# Patient Record
Sex: Male | Born: 1950
Health system: Southern US, Community
[De-identification: ages and names within clinical notes are randomized; demographics above are authoritative.]

## PROBLEM LIST (undated history)

## (undated) DIAGNOSIS — K859 Acute pancreatitis without necrosis or infection, unspecified: Secondary | ICD-10-CM

## (undated) DIAGNOSIS — K635 Polyp of colon: Secondary | ICD-10-CM

## (undated) DIAGNOSIS — I519 Heart disease, unspecified: Secondary | ICD-10-CM

## (undated) DIAGNOSIS — I2510 Atherosclerotic heart disease of native coronary artery without angina pectoris: Secondary | ICD-10-CM

## (undated) DIAGNOSIS — G629 Polyneuropathy, unspecified: Secondary | ICD-10-CM

## (undated) DIAGNOSIS — S2249XA Multiple fractures of ribs, unspecified side, initial encounter for closed fracture: Secondary | ICD-10-CM

## (undated) DIAGNOSIS — E785 Hyperlipidemia, unspecified: Secondary | ICD-10-CM

## (undated) DIAGNOSIS — I10 Essential (primary) hypertension: Secondary | ICD-10-CM

## (undated) HISTORY — DX: Heart disease, unspecified: I51.9

## (undated) HISTORY — DX: Acute pancreatitis without necrosis or infection, unspecified: K85.90

## (undated) HISTORY — DX: Essential (primary) hypertension: I10

## (undated) HISTORY — DX: Hyperlipidemia, unspecified: E78.5

## (undated) HISTORY — DX: Polyneuropathy, unspecified: G62.9

## (undated) HISTORY — DX: Polyp of colon: K63.5

---

## 1999-12-23 HISTORY — PX: OTHER SURGICAL HISTORY: SHX169

## 2008-12-22 HISTORY — PX: HERNIA REPAIR: SHX51

## 2012-12-22 HISTORY — PX: ROTATOR CUFF REPAIR: SHX139

## 2012-12-22 HISTORY — PX: CORONARY ARTERY BYPASS GRAFT: SHX141

## 2015-07-03 ENCOUNTER — Ambulatory Visit (INDEPENDENT_AMBULATORY_CARE_PROVIDER_SITE_OTHER): Payer: Self-pay | Admitting: Cardiovascular Disease

## 2015-07-03 ENCOUNTER — Encounter: Payer: Self-pay | Admitting: Cardiovascular Disease

## 2015-07-03 VITALS — BP 126/68 | HR 60 | Ht 68.0 in | Wt 175.8 lb

## 2015-07-03 DIAGNOSIS — Z789 Other specified health status: Secondary | ICD-10-CM

## 2015-07-03 DIAGNOSIS — Z951 Presence of aortocoronary bypass graft: Secondary | ICD-10-CM

## 2015-07-03 DIAGNOSIS — Z889 Allergy status to unspecified drugs, medicaments and biological substances status: Secondary | ICD-10-CM

## 2015-07-03 DIAGNOSIS — I25810 Atherosclerosis of coronary artery bypass graft(s) without angina pectoris: Secondary | ICD-10-CM

## 2015-07-03 DIAGNOSIS — E785 Hyperlipidemia, unspecified: Secondary | ICD-10-CM | POA: Diagnosis not present

## 2015-07-03 DIAGNOSIS — I10 Essential (primary) hypertension: Secondary | ICD-10-CM | POA: Diagnosis not present

## 2015-07-03 LAB — LIPID PANEL
CHOL/HDL RATIO: 5.1 ratio
CHOLESTEROL: 174 mg/dL (ref 0–200)
HDL: 34 mg/dL — AB (ref >=40)
LDL Cholesterol: 120 mg/dL — ABNORMAL HIGH (ref 0–99)
TRIGLYCERIDES: 102 mg/dL (ref <150)
VLDL: 20 mg/dL (ref 0–40)

## 2015-07-03 NOTE — Progress Notes (Signed)
Patient ID: [name], male   DOB: 05-24-1951, 64 y.o.   MRN: [id number]       CARDIOLOGY CONSULT NOTE  Patient ID: [name] MRN: [id number] DOB/AGE: 1951/07/28 64 y.o.  Admit date: (Not on file) Primary Physician Asencion Noble, MD  Reason for Consultation: CABG  HPI: The patient is a 64 year old male who I am meeting for the first time today. He was previously seen by cardiologist at [facility] in [place], [place]. He reportedly has a history of coronary artery disease and two-vessel CABG by Dr. Redmond Pulling with a LIMA to the LAD and a vein graft to the diagonal. He also has hypertension and dyslipidemia. CABG was performed in April 2014. A stress echocardiogram at that time demonstrated normal left ventricular systolic function, LVEF 86%, with apical and septal hypokinesis at rest. ECG performed on 05/20/13 demonstrated normal sinus rhythm with late R-wave progression and a nonspecific T wave abnormality with T-wave inversions in leads V1, V2, and aVL.  He is doing well and denies chest pain, palpitations, leg swelling, orthopnea, dizziness, and shortness of breath. He stays active and golfs and is remodeling his home. He was not able to tolerate low-dose lovastatin as it led to severe lower extremity aches and cramps. He has never smoked. He said his total cholesterol used to run 180-200.   ECG performed in the office today demonstrates sinus bradycardia, heart rate 56 bpm, with a mild nonspecific T wave abnormality in aVL and V2. There is possible old anteroseptal infarct.  His primary care physician in [place] was Dr. Anastasia Pall.   Soc: He lived in [place] for 49 years and then moved to [place] for 16 years but calls [place] home. He used to work in [employer] but has since retired.   Not on File  Current Outpatient Prescriptions  Medication Sig Dispense Refill  . aspirin 81 MG tablet Take 81 mg by mouth daily.    . irbesartan (AVAPRO) 75 MG  tablet Take 75 mg by mouth daily.    . metoprolol tartrate (LOPRESSOR) 25 MG tablet Take 12.5 mg by mouth every morning.    . sildenafil (VIAGRA) 100 MG tablet Take 100 mg by mouth daily as needed for erectile dysfunction.    . Vitamin D, Ergocalciferol, (DRISDOL) 50000 UNITS CAPS capsule Take 50,000 Units by mouth every 7 (seven) days.     No current facility-administered medications for this visit.    No past medical history on file.  No past surgical history on file.  History   Social History  . Marital Status: Married    Spouse Name: N/A  . Number of Children: N/A  . Years of Education: N/A   Occupational History  . Not on file.   Social History Main Topics  . Smoking status: Never Smoker   . Smokeless tobacco: Not on file  . Alcohol Use: Not on file  . Drug Use: Not on file  . Sexual Activity: Not on file   Other Topics Concern  . Not on file   Social History Narrative  . No narrative on file     No family history of premature CAD in 1st degree relatives.  Prior to Admission medications   Not on File     Review of systems complete and found to be negative unless listed above in HPI     Physical exam Blood pressure 126/68, pulse 60, height 5\' 8"  (1.727 m), weight 175 lb 12.8 oz (79.742 kg), SpO2 97 %.  General: NAD Neck: No JVD, no thyromegaly or thyroid nodule.  Lungs: Clear to auscultation bilaterally with normal respiratory effort. CV: Nondisplaced PMI. Regular rate and rhythm, normal S1/S2, no S3/S4, no murmur.  No peripheral edema.  No carotid bruit.  Normal pedal pulses.  Abdomen: Soft, nontender, no hepatosplenomegaly, no distention.  Skin: Intact without lesions or rashes.  Neurologic: Alert and oriented x 3.  Psych: Normal affect. Extremities: No clubbing or cyanosis.  HEENT: Normal.   ECG: Most recent ECG reviewed.  Labs:  No results found for: WBC, HGB, HCT, MCV, PLT No results for input(s): NA, K, CL, CO2, BUN, CREATININE, CALCIUM,  PROT, BILITOT, ALKPHOS, ALT, AST, GLUCOSE in the last 168 hours.  Invalid input(s): LABALBU No results found for: CKTOTAL, CKMB, CKMBINDEX, TROPONINI No results found for: CHOL No results found for: HDL No results found for: LDLCALC No results found for: TRIG No results found for: CHOLHDL No results found for: LDLDIRECT       Studies: No results found.  ASSESSMENT AND PLAN:  1. CAD with 2-vessel CABG in 03/2013: Symptomatically stable. Continue ASA and metoprolol. Intolerant of statin therapy.   2. Essential HTN: Well controlled. Continue irbesartan 75 mg daily.  3. Dyslipidemia: Intolerant of low-dose lovastatin. Will obtain lipid panel.  Dispo: f/u 1 year.   Signed: Kate Sable, M.D., F.A.C.C.  07/03/2015, 9:42 AM

## 2015-07-03 NOTE — Patient Instructions (Addendum)
Your physician wants you to follow-up in: 1 year with Dr.Koneswaran You will receive a reminder letter in the mail two months in advance. If you don't receive a letter, please call our office to schedule the follow-up appointment.   Your physician recommends that you continue on your current medications as directed. Please refer to the Current Medication list given to you today.   Please get FASTING lipid and BMET      Thank you for choosing [facility] !

## 2015-07-04 ENCOUNTER — Telehealth: Payer: Self-pay

## 2015-07-04 MED ORDER — ROSUVASTATIN CALCIUM 10 MG PO TABS: 10.0000 mg | ORAL_TABLET | Freq: Every day | ORAL | Status: DC

## 2015-07-04 NOTE — Telephone Encounter (Signed)
-----   Message from Herminio Commons, MD sent at 07/04/2015 11:00 AM EDT ----- Needs statin therapy. Try low-dose Crestor 10 mg.

## 2015-07-04 NOTE — Telephone Encounter (Signed)
Pt notified rx called in.

## 2015-07-05 LAB — BASIC METABOLIC PANEL
BUN: 21 mg/dL (ref 6–23)
CALCIUM: 9.6 mg/dL (ref 8.4–10.5)
CHLORIDE: 103 meq/L (ref 96–112)
CO2: 26 mEq/L (ref 19–32)
Creat: 0.98 mg/dL (ref 0.50–1.35)
GLUCOSE: 88 mg/dL (ref 70–99)
POTASSIUM: 4.3 meq/L (ref 3.5–5.3)
SODIUM: 139 meq/L (ref 135–145)

## 2016-07-22 ENCOUNTER — Ambulatory Visit (INDEPENDENT_AMBULATORY_CARE_PROVIDER_SITE_OTHER): Payer: Medicare Other | Admitting: Cardiovascular Disease

## 2016-07-22 ENCOUNTER — Encounter (INDEPENDENT_AMBULATORY_CARE_PROVIDER_SITE_OTHER): Payer: Self-pay

## 2016-07-22 ENCOUNTER — Encounter: Payer: Self-pay | Admitting: Cardiovascular Disease

## 2016-07-22 VITALS — BP 107/70 | HR 71 | Ht 68.0 in | Wt 171.0 lb

## 2016-07-22 DIAGNOSIS — E785 Hyperlipidemia, unspecified: Secondary | ICD-10-CM | POA: Diagnosis not present

## 2016-07-22 DIAGNOSIS — I25810 Atherosclerosis of coronary artery bypass graft(s) without angina pectoris: Secondary | ICD-10-CM | POA: Diagnosis not present

## 2016-07-22 DIAGNOSIS — I10 Essential (primary) hypertension: Secondary | ICD-10-CM | POA: Diagnosis not present

## 2016-07-22 DIAGNOSIS — Z889 Allergy status to unspecified drugs, medicaments and biological substances status: Secondary | ICD-10-CM | POA: Diagnosis not present

## 2016-07-22 DIAGNOSIS — Z789 Other specified health status: Secondary | ICD-10-CM

## 2016-07-22 NOTE — Patient Instructions (Signed)
Your physician wants you to follow-up in: 1 year You will receive a reminder letter in the mail two months in advance. If you don't receive a letter, please call our office to schedule the follow-up appointment.    Your physician recommends that you continue on your current medications as directed. Please refer to the Current Medication list given to you today.     Thank you for choosing [facility] !  

## 2016-07-22 NOTE — Progress Notes (Signed)
      SUBJECTIVE: The patient presents for follow up of CAD and h/o CABG. The patient denies any symptoms of chest pain, palpitations, shortness of breath, lightheadedness, dizziness, leg swelling, orthopnea, PND, and syncope. ECG performed in the office today which I personally interpreted demonstrated sinus rhythm with old anteroseptal infarct and no ischemic ST segment or T-wave abnormalities.  He walks 2 miles 4 days a week and golfs 3 days a week. 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 He developed myalgias with Crestor and is now taking Zetia.  Review of Systems: As per "subjective", otherwise negative.  No Known Allergies  Current Outpatient Prescriptions  Medication Sig Dispense Refill  . aspirin 81 MG tablet Take 81 mg by mouth daily.    Marland Kitchen ezetimibe (ZETIA) 10 MG tablet Take 10 mg by mouth daily.   11  . irbesartan (AVAPRO) 75 MG tablet Take 75 mg by mouth daily.    . metoprolol tartrate (LOPRESSOR) 25 MG tablet Take 12.5 mg by mouth every morning.    . sildenafil (VIAGRA) 100 MG tablet Take 100 mg by mouth daily as needed for erectile dysfunction.     No current facility-administered medications for this visit.     No past medical history on file.  Past Surgical History:  Procedure Laterality Date  . HERNIA REPAIR    . ROTATOR CUFF REPAIR    . vericous veins  2001    Social History   Social History  . Marital status: Married    Spouse name: N/A  . Number of children: N/A  . Years of education: N/A   Occupational History  . Not on file.   Social History Main Topics  . Smoking status: Never Smoker  . Smokeless tobacco: Never Used  . Alcohol use No  . Drug use: No  . Sexual activity: Not on file   Other Topics Concern  . Not on file   Social History Narrative  . No narrative on file     Vitals:   07/22/16 0901  BP: 107/70  Pulse: 71  SpO2: 97%  Weight: 171 lb (77.6 kg)  Height: 5\' 8"  (1.727 m)    PHYSICAL EXAM General: NAD HEENT:  Normal. Neck: No JVD, no thyromegaly. Lungs: Clear to auscultation bilaterally with normal respiratory effort. CV: Nondisplaced PMI.  Regular rate and rhythm, normal S1/S2, no S3/S4, no murmur. No pretibial or periankle edema.  No carotid bruit.   Abdomen: Soft, nontender, no distention.  Neurologic: Alert and oriented.  Psych: Normal affect. Skin: Normal. Musculoskeletal: No gross deformities.    ECG: Most recent ECG reviewed.      ASSESSMENT AND PLAN: 1. CAD with 2-vessel CABG in 03/2013: Symptomatically stable. Continue ASA and metoprolol. Intolerant of statin therapy. On Zetia.  2. Essential HTN: Well controlled. Continue irbesartan 75 mg daily.  3. Dyslipidemia: Intolerant of low-dose lovastatin and Crestor. Taking Zetia. TC reportedly 163.  Dispo: f/u 1 year.   Kate Sable, M.D., F.A.C.C.

## 2016-07-30 ENCOUNTER — Encounter (INDEPENDENT_AMBULATORY_CARE_PROVIDER_SITE_OTHER): Payer: Self-pay | Admitting: *Deleted

## 2016-12-10 ENCOUNTER — Encounter (INDEPENDENT_AMBULATORY_CARE_PROVIDER_SITE_OTHER): Payer: Self-pay

## 2016-12-11 ENCOUNTER — Other Ambulatory Visit (INDEPENDENT_AMBULATORY_CARE_PROVIDER_SITE_OTHER): Payer: Self-pay | Admitting: *Deleted

## 2016-12-11 DIAGNOSIS — Z86010 Personal history of colonic polyps: Secondary | ICD-10-CM | POA: Insufficient documentation

## 2017-01-23 ENCOUNTER — Encounter (INDEPENDENT_AMBULATORY_CARE_PROVIDER_SITE_OTHER): Payer: Self-pay | Admitting: *Deleted

## 2017-01-23 ENCOUNTER — Telehealth (INDEPENDENT_AMBULATORY_CARE_PROVIDER_SITE_OTHER): Payer: Self-pay | Admitting: *Deleted

## 2017-01-23 NOTE — Telephone Encounter (Signed)
Patient needs suprep 

## 2017-01-26 MED ORDER — SUPREP BOWEL PREP KIT 17.5-3.13-1.6 GM/177ML PO SOLN: 1.0000 | Freq: Once | ORAL | 0 refills | Status: AC

## 2017-01-27 DIAGNOSIS — H5203 Hypermetropia, bilateral: Secondary | ICD-10-CM | POA: Diagnosis not present

## 2017-01-27 DIAGNOSIS — H524 Presbyopia: Secondary | ICD-10-CM | POA: Diagnosis not present

## 2017-01-27 DIAGNOSIS — H52223 Regular astigmatism, bilateral: Secondary | ICD-10-CM | POA: Diagnosis not present

## 2017-02-13 ENCOUNTER — Telehealth (INDEPENDENT_AMBULATORY_CARE_PROVIDER_SITE_OTHER): Payer: Self-pay | Admitting: *Deleted

## 2017-02-13 NOTE — Telephone Encounter (Signed)
Referring MD/PCP: fagan   Procedure: tcs  Reason/Indication:  Hx polyps  Has patient had this procedure before?  Yes, 2010 -- scanned  If so, when, by whom and where?    Is there a family history of colon cancer?  no  Who?  What age when diagnosed?    Is patient diabetic?   no      Does patient have prosthetic heart valve or mechanical valve?  no  Do you have a pacemaker?  no  Has patient ever had endocarditis? no  Has patient had joint replacement within last 12 months?  no  Does patient tend to be constipated or take laxatives? no  Does patient have a history of alcohol/drug use?  no  Is patient on Coumadin, Plavix and/or Aspirin? yes  Medications: asa 81 mg daily, irbesartan 75 mg daily, metoprolol 25 mg 1/2 tab daily  Allergies: nkda  Medication Adjustment: asa 2 days  Procedure date & time: 03/11/17 at 930

## 2017-02-16 NOTE — Telephone Encounter (Signed)
agree

## 2017-03-11 ENCOUNTER — Encounter (HOSPITAL_COMMUNITY): Payer: Self-pay | Admitting: *Deleted

## 2017-03-11 ENCOUNTER — Encounter (HOSPITAL_COMMUNITY)
Admission: RE | Disposition: A | Discharge status: Home / Self Care | Payer: Self-pay | Source: Ambulatory Visit | Attending: Internal Medicine

## 2017-03-11 ENCOUNTER — Ambulatory Visit (HOSPITAL_COMMUNITY)
Admission: RE | Admit: 2017-03-11 | Discharge: 2017-03-11 | Disposition: A | Discharge status: Home / Self Care | Payer: PPO | Source: Ambulatory Visit | Attending: Internal Medicine | Admitting: Internal Medicine

## 2017-03-11 DIAGNOSIS — I10 Essential (primary) hypertension: Secondary | ICD-10-CM | POA: Diagnosis not present

## 2017-03-11 DIAGNOSIS — Z79899 Other long term (current) drug therapy: Secondary | ICD-10-CM | POA: Insufficient documentation

## 2017-03-11 DIAGNOSIS — Z86010 Personal history of colonic polyps: Secondary | ICD-10-CM | POA: Insufficient documentation

## 2017-03-11 DIAGNOSIS — Z951 Presence of aortocoronary bypass graft: Secondary | ICD-10-CM | POA: Insufficient documentation

## 2017-03-11 DIAGNOSIS — D122 Benign neoplasm of ascending colon: Secondary | ICD-10-CM | POA: Diagnosis not present

## 2017-03-11 DIAGNOSIS — I2510 Atherosclerotic heart disease of native coronary artery without angina pectoris: Secondary | ICD-10-CM | POA: Diagnosis not present

## 2017-03-11 DIAGNOSIS — E785 Hyperlipidemia, unspecified: Secondary | ICD-10-CM | POA: Insufficient documentation

## 2017-03-11 DIAGNOSIS — Z09 Encounter for follow-up examination after completed treatment for conditions other than malignant neoplasm: Secondary | ICD-10-CM | POA: Diagnosis not present

## 2017-03-11 DIAGNOSIS — D123 Benign neoplasm of transverse colon: Secondary | ICD-10-CM | POA: Diagnosis not present

## 2017-03-11 DIAGNOSIS — Z1211 Encounter for screening for malignant neoplasm of colon: Secondary | ICD-10-CM | POA: Insufficient documentation

## 2017-03-11 DIAGNOSIS — K635 Polyp of colon: Secondary | ICD-10-CM | POA: Insufficient documentation

## 2017-03-11 DIAGNOSIS — Z7982 Long term (current) use of aspirin: Secondary | ICD-10-CM | POA: Diagnosis not present

## 2017-03-11 HISTORY — PX: POLYPECTOMY: SHX5525

## 2017-03-11 HISTORY — DX: Atherosclerotic heart disease of native coronary artery without angina pectoris: I25.10

## 2017-03-11 HISTORY — PX: COLONOSCOPY: SHX5424

## 2017-03-11 SURGERY — COLONOSCOPY
Anesthesia: Moderate Sedation

## 2017-03-11 MED ORDER — MEPERIDINE HCL 50 MG/ML IJ SOLN
INTRAMUSCULAR | Status: DC | PRN
  Administered 2017-03-11 (×2): 25 mg via INTRAVENOUS

## 2017-03-11 MED ORDER — MIDAZOLAM HCL 5 MG/5ML IJ SOLN
INTRAMUSCULAR | Status: DC | PRN
Start: 2017-03-11 — End: 2017-03-11
  Administered 2017-03-11: 1 mg via INTRAVENOUS
  Administered 2017-03-11 (×2): 2 mg via INTRAVENOUS
  Administered 2017-03-11: 1 mg via INTRAVENOUS

## 2017-03-11 MED ORDER — MIDAZOLAM HCL 5 MG/5ML IJ SOLN
INTRAMUSCULAR | Status: AC
  Filled 2017-03-11: qty 10

## 2017-03-11 MED ORDER — STERILE WATER FOR IRRIGATION IR SOLN
Status: DC | PRN
  Administered 2017-03-11: 5 mL

## 2017-03-11 MED ORDER — SODIUM CHLORIDE 0.9 % IV SOLN
INTRAVENOUS | Status: DC
  Administered 2017-03-11: 1000 mL via INTRAVENOUS

## 2017-03-11 MED ORDER — MEPERIDINE HCL 50 MG/ML IJ SOLN
INTRAMUSCULAR | Status: AC
  Filled 2017-03-11: qty 1

## 2017-03-11 NOTE — Discharge Instructions (Signed)
Resume aspirin on 03/12/2017. Resume other medications as before. Resume usual diet. No driving for 24 hours. Physician will call with biopsy results.    Colonoscopy, Adult, Care After This sheet gives you information about how to care for yourself after your procedure. Your doctor may also give you more specific instructions. If you have problems or questions, call your doctor. Follow these instructions at home: General instructions    For the first 24 hours after the procedure:  Do not drive or use machinery.  Do not sign important documents.  Do not drink alcohol.  Do your daily activities more slowly than normal.  Eat foods that are soft and easy to digest.  Rest often.  Take over-the-counter or prescription medicines only as told by your doctor.  It is up to you to get the results of your procedure. Ask your doctor, or the department performing the procedure, when your results will be ready. To help cramping and bloating:   Try walking around.  Put heat on your belly (abdomen) as told by your doctor. Use a heat source that your doctor recommends, such as a moist heat pack or a heating pad.  Put a towel between your skin and the heat source.  Leave the heat on for 20-30 minutes.  Remove the heat if your skin turns bright red. This is especially important if you cannot feel pain, heat, or cold. You can get burned. Eating and drinking   Drink enough fluid to keep your pee (urine) clear or pale yellow.  Return to your normal diet as told by your doctor. Avoid heavy or fried foods that are hard to digest.  Avoid drinking alcohol for as long as told by your doctor. Contact a doctor if:  You have blood in your poop (stool) 2-3 days after the procedure. Get help right away if:  You have more than a small amount of blood in your poop.  You see large clumps of tissue (blood clots) in your poop.  Your belly is swollen.  You feel sick to your stomach  (nauseous).  You throw up (vomit).  You have a fever.  You have belly pain that gets worse, and medicine does not help your pain.     Colon Polyps Polyps are tissue growths inside the body. Polyps can grow in many places, including the large intestine (colon). A polyp may be a round bump or a mushroom-shaped growth. You could have one polyp or several. Most colon polyps are noncancerous (benign). However, some colon polyps can become cancerous over time. What are the causes? The exact cause of colon polyps is not known. What increases the risk? This condition is more likely to develop in people who:  Have a family history of colon cancer or colon polyps.  Are older than 9 or older than 45 if they are African American.  Have inflammatory bowel disease, such as ulcerative colitis or Crohn disease.  Are overweight.  Smoke cigarettes.  Do not get enough exercise.  Drink too much alcohol.  Eat a diet that is:  High in fat and red meat.  Low in fiber.  Had childhood cancer that was treated with abdominal radiation. What are the signs or symptoms? Most polyps do not cause symptoms. If you have symptoms, they may include:  Blood coming from your rectum when having a bowel movement.  Blood in your stool.The stool may look dark red or black.  A change in bowel habits, such as constipation or diarrhea. How  is this diagnosed? This condition is diagnosed with a colonoscopy. This is a procedure that uses a lighted, flexible scope to look at the inside of your colon. How is this treated? Treatment for this condition involves removing any polyps that are found. Those polyps will then be tested for cancer. If cancer is found, your health care provider will talk to you about options for colon cancer treatment. Follow these instructions at home: Diet   Eat plenty of fiber, such as fruits, vegetables, and whole grains.  Eat foods that are high in calcium and vitamin D, such as  milk, cheese, yogurt, eggs, liver, fish, and broccoli.  Limit foods high in fat, red meats, and processed meats, such as hot dogs, sausage, bacon, and lunch meats.  Maintain a healthy weight, or lose weight if recommended by your health care provider. General instructions   Do not smoke cigarettes.  Do not drink alcohol excessively.  Keep all follow-up visits as told by your health care provider. This is important. This includes keeping regularly scheduled colonoscopies. Talk to your health care provider about when you need a colonoscopy.  Exercise every day or as told by your health care provider. Contact a health care provider if:  You have new or worsening bleeding during a bowel movement.  You have new or increased blood in your stool.  You have a change in bowel habits.  You unexpectedly lose weight. This information is not intended to replace advice given to you by your health care provider. Make sure you discuss any questions you have with your health care provider. Document Released: 09/03/2004 Document Revised: 05/15/2016 Document Reviewed: 10/29/2015 Elsevier Interactive Patient Education  2017 [employer].

## 2017-03-11 NOTE — Op Note (Signed)
[facility] Patient Name: [name] Procedure Date: 03/11/2017 9:12 AM MRN: [id number] Date of Birth: [birth year] Attending MD: Hildred Laser , MD CSN: [id number] Age: 66 Admit Type: Outpatient Procedure:                Colonoscopy Indications:              High risk colon cancer surveillance: Personal                            history of colonic polyps Providers:                Hildred Laser, MD, Otis Peak B. Sharon Seller, RN, Aram Candela Referring MD:             Asencion Noble, MD Medicines:                Meperidine 50 mg IV, Midazolam 6 mg IV Complications:            No immediate complications. Estimated Blood Loss:     Estimated blood loss was minimal. Procedure:                Pre-Anesthesia Assessment:                           - Prior to the procedure, a History and Physical                            was performed, and patient medications and                            allergies were reviewed. The patient's tolerance of                            previous anesthesia was also reviewed. The risks                            and benefits of the procedure and the sedation                            options and risks were discussed with the patient.                            All questions were answered, and informed consent                            was obtained. Prior Anticoagulants: The patient                            last took aspirin 4 days prior to the procedure.                            ASA Grade Assessment: II - A patient with mild  systemic disease. After reviewing the risks and                            benefits, the patient was deemed in satisfactory                            condition to undergo the procedure.                           After obtaining informed consent, the colonoscope                            was passed under direct vision. Throughout the                            procedure, the patient's  blood pressure, pulse, and                            oxygen saturations were monitored continuously. The                            [id number] ([id number]) was introduced through the                            anus and advanced to the the cecum, identified by                            appendiceal orifice and ileocecal valve. The                            colonoscopy was performed without difficulty. The                            patient tolerated the procedure well. The quality                            of the bowel preparation was adequate. The                            ileocecal valve, appendiceal orifice, and rectum                            were photographed. Scope In: 9:30:42 AM Scope Out: 9:53:26 AM Scope Withdrawal Time: 0 hours 18 minutes 5 seconds  Total Procedure Duration: 0 hours 22 minutes 44 seconds  Findings:      The perianal and digital rectal examinations were normal.      A 6 mm polyp was found in the mid ascending colon. The polyp was       sessile. The polyp was removed with a cold snare. Resection and       retrieval were complete. The pathology specimen was placed into Bottle       Number 1.      Two sessile polyps were found in the hepatic flexure. The polyps were       small in size.  These polyps were removed with a cold snare. Resection       and retrieval were complete. The pathology specimen was placed into       Bottle Number 1.      The retroflexed view of the distal rectum and anal verge was normal and       showed no anal or rectal abnormalities. Impression:               - One 6 mm polyp in the mid ascending colon,                            removed with a cold snare. Resected and retrieved.                           - Two small polyps at the hepatic flexure, removed                            with a cold snare. Resected and retrieved. Moderate Sedation:      Moderate (conscious) sedation was administered by the endoscopy nurse       and supervised  by the endoscopist. The following parameters were       monitored: oxygen saturation, heart rate, blood pressure, CO2       capnography and response to care. Total physician intraservice time was       29 minutes. Recommendation:           - Patient has a contact number available for                            emergencies. The signs and symptoms of potential                            delayed complications were discussed with the                            patient. Return to normal activities tomorrow.                            Written discharge instructions were provided to the                            patient.                           - Resume previous diet today.                           - Continue present medications.                           - Resume aspirin at prior dose tomorrow.                           - Await pathology results.                           - Repeat colonoscopy date to be  determined after                            pending pathology results are reviewed for                            surveillance based on pathology results. Procedure Code(s):        --- Professional ---                           [phone], Colonoscopy, flexible; with removal of                            tumor(s), polyp(s), or other lesion(s) by snare                            technique                           99152, Moderate sedation services provided by the                            same physician or other qualified health care                            professional performing the diagnostic or                            therapeutic service that the sedation supports,                            requiring the presence of an independent trained                            observer to assist in the monitoring of the                            patient's level of consciousness and physiological                            status; initial 15 minutes of intraservice time,                             patient age 5 years or older                           [phone], Moderate sedation services; each additional                            15 minutes intraservice time Diagnosis Code(s):        --- Professional ---                           Z86.010, Personal history of colonic polyps  D12.2, Benign neoplasm of ascending colon                           D12.3, Benign neoplasm of transverse colon (hepatic                            flexure or splenic flexure) CPT copyright 2016 American Medical Association. All rights reserved. The codes documented in this report are preliminary and upon coder review may  be revised to meet current compliance requirements. Hildred Laser, MD Hildred Laser, MD 03/11/2017 10:01:07 AM This report has been signed electronically. Number of Addenda: 0

## 2017-03-11 NOTE — H&P (Signed)
[name] is an 66 y.o. male.   Chief Complaint: Patient is here for colonoscopy. HPI: Patient is 67 year old Caucasian male with history of colonic polyps. This is patient's fifth colonoscopy. He has had polyps and 3 first exam but not on the last exam in 2010. Patient denies abdominal pain change in bowel habits or rectal bleeding. He has been off aspirin for 4 days. Family history is negative for CRC.  Past Medical History:  Diagnosis Date  . Coronary artery disease         Hyperlipidemia       Hypertension  Past Surgical History:  Procedure Laterality Date  . CORONARY ARTERY BYPASS GRAFT  2014  . HERNIA REPAIR    . ROTATOR CUFF REPAIR    . vericous veins  2001    Family History  Problem Relation Age of Onset  . Heart disease Father   . Dementia Sister   . Stroke Mother    Social History:  reports that he has never smoked. He has never used smokeless tobacco. He reports that he drinks alcohol. He reports that he does not use drugs.  Allergies: No Known Allergies  Medications Prior to Admission  Medication Sig Dispense Refill  . acetaminophen (TYLENOL) 325 MG tablet Take 650 mg by mouth daily as needed for moderate pain or headache.    Marland Kitchen aspirin 81 MG tablet Take 81 mg by mouth daily.    . Cholecalciferol (VITAMIN D PO) Take 1 capsule by mouth 3 (three) times a week.    . irbesartan (AVAPRO) 75 MG tablet Take 75 mg by mouth daily.    . metoprolol tartrate (LOPRESSOR) 25 MG tablet Take 12.5 mg by mouth every morning.    . Multiple Vitamins-Minerals (ZINC PO) Take 1 capsule by mouth 3 (three) times a week.    . sildenafil (REVATIO) 20 MG tablet Take 40-100 mg by mouth daily as needed (erectile dysfunction).      No results found for this or any previous visit (from the past 48 hour(s)). No results found.  ROS  Blood pressure 116/74, pulse 61, temperature 97.7 F (36.5 C), temperature source Oral, resp. rate 15, height 5\' 8"  (1.727 m), weight 175 lb (79.4 kg), SpO2  98 %. Physical Exam  Constitutional: He appears well-developed and well-nourished.  HENT:  Mouth/Throat: Oropharynx is clear and moist.  Eyes: Conjunctivae are normal.  Neck: No thyromegaly present.  Cardiovascular: Normal rate, regular rhythm and normal heart sounds.   No murmur heard. Respiratory: Effort normal and breath sounds normal.  GI: Soft. He exhibits no distension and no mass. There is no tenderness.  Musculoskeletal: He exhibits no edema.  Lymphadenopathy:    He has no cervical adenopathy.  Neurological: He is alert.  Skin: Skin is warm and dry.     Assessment/Plan History of colonic adenomas. Surveillance colonoscopy  Hildred Laser, MD 03/11/2017, 9:20 AM

## 2017-03-16 ENCOUNTER — Encounter (HOSPITAL_COMMUNITY): Payer: Self-pay | Admitting: Internal Medicine

## 2017-04-09 DIAGNOSIS — R202 Paresthesia of skin: Secondary | ICD-10-CM | POA: Diagnosis not present

## 2017-04-14 DIAGNOSIS — R7301 Impaired fasting glucose: Secondary | ICD-10-CM | POA: Diagnosis not present

## 2017-06-22 DIAGNOSIS — D485 Neoplasm of uncertain behavior of skin: Secondary | ICD-10-CM | POA: Diagnosis not present

## 2017-06-22 DIAGNOSIS — L821 Other seborrheic keratosis: Secondary | ICD-10-CM | POA: Diagnosis not present

## 2017-06-22 DIAGNOSIS — D225 Melanocytic nevi of trunk: Secondary | ICD-10-CM | POA: Diagnosis not present

## 2017-07-23 DIAGNOSIS — I2510 Atherosclerotic heart disease of native coronary artery without angina pectoris: Secondary | ICD-10-CM | POA: Diagnosis not present

## 2017-07-23 DIAGNOSIS — E785 Hyperlipidemia, unspecified: Secondary | ICD-10-CM | POA: Diagnosis not present

## 2017-07-23 DIAGNOSIS — Z125 Encounter for screening for malignant neoplasm of prostate: Secondary | ICD-10-CM | POA: Diagnosis not present

## 2017-07-23 DIAGNOSIS — I10 Essential (primary) hypertension: Secondary | ICD-10-CM | POA: Diagnosis not present

## 2017-07-23 DIAGNOSIS — Z79899 Other long term (current) drug therapy: Secondary | ICD-10-CM | POA: Diagnosis not present

## 2017-07-30 DIAGNOSIS — R202 Paresthesia of skin: Secondary | ICD-10-CM | POA: Diagnosis not present

## 2017-07-30 DIAGNOSIS — H0019 Chalazion unspecified eye, unspecified eyelid: Secondary | ICD-10-CM | POA: Diagnosis not present

## 2017-07-30 DIAGNOSIS — Z23 Encounter for immunization: Secondary | ICD-10-CM | POA: Diagnosis not present

## 2017-07-30 DIAGNOSIS — I10 Essential (primary) hypertension: Secondary | ICD-10-CM | POA: Diagnosis not present

## 2017-07-30 DIAGNOSIS — I2510 Atherosclerotic heart disease of native coronary artery without angina pectoris: Secondary | ICD-10-CM | POA: Diagnosis not present

## 2017-08-26 ENCOUNTER — Ambulatory Visit (INDEPENDENT_AMBULATORY_CARE_PROVIDER_SITE_OTHER): Payer: PPO | Admitting: Cardiovascular Disease

## 2017-08-26 ENCOUNTER — Encounter: Payer: Self-pay | Admitting: Cardiovascular Disease

## 2017-08-26 VITALS — BP 106/68 | HR 55 | Ht 68.0 in | Wt 181.0 lb

## 2017-08-26 DIAGNOSIS — I10 Essential (primary) hypertension: Secondary | ICD-10-CM | POA: Diagnosis not present

## 2017-08-26 DIAGNOSIS — E785 Hyperlipidemia, unspecified: Secondary | ICD-10-CM

## 2017-08-26 DIAGNOSIS — I25708 Atherosclerosis of coronary artery bypass graft(s), unspecified, with other forms of angina pectoris: Secondary | ICD-10-CM

## 2017-08-26 DIAGNOSIS — Z789 Other specified health status: Secondary | ICD-10-CM

## 2017-08-26 NOTE — Progress Notes (Signed)
SUBJECTIVE: The patient presents for follow up of CAD and h/o CABG.  The patient denies any symptoms of chest pain, palpitations, shortness of breath, lightheadedness, dizziness, leg swelling, orthopnea, PND, and syncope.  ECG performed in the office today which I ordered and personally interpreted demonstrated sinus bradycardia, 59 bpm, with possible old anteroseptal infarct.   Review of Systems: As per "subjective", otherwise negative.  No Known Allergies  Current Outpatient Prescriptions  Medication Sig Dispense Refill  . acetaminophen (TYLENOL) 325 MG tablet Take 650 mg by mouth daily as needed for moderate pain or headache.    Marland Kitchen aspirin 81 MG tablet Take 1 tablet (81 mg total) by mouth daily. 30 tablet   . irbesartan (AVAPRO) 75 MG tablet Take 75 mg by mouth daily.    . metoprolol tartrate (LOPRESSOR) 25 MG tablet Take 12.5 mg by mouth every morning.    . Multiple Vitamins-Minerals (ZINC PO) Take 1 capsule by mouth 3 (three) times a week.    . sildenafil (REVATIO) 20 MG tablet Take 40-100 mg by mouth daily as needed (erectile dysfunction).     No current facility-administered medications for this visit.     Past Medical History:  Diagnosis Date  . Coronary artery disease     Past Surgical History:  Procedure Laterality Date  . COLONOSCOPY N/A 03/11/2017   Procedure: COLONOSCOPY;  Surgeon: Rogene Houston, MD;  [facility];  Service: Endoscopy;  Laterality: N/A;  930  . CORONARY ARTERY BYPASS GRAFT  2014  . HERNIA REPAIR    . POLYPECTOMY  03/11/2017   Procedure: POLYPECTOMY;  Surgeon: Rogene Houston, MD;  [facility];  Service: Endoscopy;;  colon  . ROTATOR CUFF REPAIR    . vericous veins  2001    Social History   Social History  . Marital status: Married    Spouse name: N/A  . Number of children: N/A  . Years of education: N/A   Occupational History  . Not on file.   Social History Main Topics  . Smoking status: Never Smoker    . Smokeless tobacco: Never Used  . Alcohol use 0.0 oz/week     Comment: Bourbon on the weekends  . Drug use: No  . Sexual activity: Not on file   Other Topics Concern  . Not on file   Social History Narrative  . No narrative on file     Vitals:   08/26/17 0829  BP: 106/68  Pulse: (!) 55  SpO2: 98%  Weight: 181 lb (82.1 kg)  Height: 5\' 8"  (1.727 m)    Wt Readings from Last 3 Encounters:  08/26/17 181 lb (82.1 kg)  03/11/17 175 lb (79.4 kg)  07/22/16 171 lb (77.6 kg)     PHYSICAL EXAM General: NAD HEENT: Normal. Neck: No JVD, no thyromegaly. Lungs: Clear to auscultation bilaterally with normal respiratory effort. CV: Nondisplaced PMI.  Regular rate and rhythm, normal S1/S2, no S3/S4, no murmur. No pretibial or periankle edema.  No carotid bruit.   Abdomen: Soft, nontender, no distention.  Neurologic: Alert and oriented.  Psych: Normal affect. Skin: Normal. Musculoskeletal: No gross deformities.    ECG: Most recent ECG reviewed.   Labs: Lab Results  Component Value Date/Time   K 4.3 07/03/2015 09:53 AM   BUN 21 07/03/2015 09:53 AM   CREATININE 0.98 07/03/2015 09:53 AM     Lipids: Lab Results  Component Value Date/Time   LDLCALC 120 (H) 07/03/2015 10:46 AM  CHOL 174 07/03/2015 10:46 AM   TRIG 102 07/03/2015 10:46 AM   HDL 34 (L) 07/03/2015 10:46 AM       ASSESSMENT AND PLAN:  1. CAD with 2-vessel CABG in 03/2013: Symptomatically stable. Continue ASA and metoprolol. Intolerant of statin therapy. No longer on Zetia.  2. Essential HTN: Well controlled. Continue irbesartan 75 mg daily.  3. Dyslipidemia: Intolerant of low-dose lovastatin and Crestor. No longer on Zetia. TC reportedly 182.      Disposition: Follow up 1 yr.   Kate Sable, M.D., F.A.C.C.

## 2017-08-26 NOTE — Addendum Note (Signed)
Addended by: Levonne Hubert on: 08/26/2017 10:06 AM   Modules accepted: Orders

## 2017-08-26 NOTE — Patient Instructions (Signed)

## 2017-10-09 DIAGNOSIS — Z23 Encounter for immunization: Secondary | ICD-10-CM | POA: Diagnosis not present

## 2018-02-02 DIAGNOSIS — I2510 Atherosclerotic heart disease of native coronary artery without angina pectoris: Secondary | ICD-10-CM | POA: Diagnosis not present

## 2018-02-02 DIAGNOSIS — G9009 Other idiopathic peripheral autonomic neuropathy: Secondary | ICD-10-CM | POA: Diagnosis not present

## 2018-02-02 DIAGNOSIS — Z6828 Body mass index (BMI) 28.0-28.9, adult: Secondary | ICD-10-CM | POA: Diagnosis not present

## 2018-03-29 ENCOUNTER — Encounter: Payer: Self-pay | Admitting: Diagnostic Neuroimaging

## 2018-03-29 ENCOUNTER — Ambulatory Visit (INDEPENDENT_AMBULATORY_CARE_PROVIDER_SITE_OTHER): Payer: PPO | Admitting: Diagnostic Neuroimaging

## 2018-03-29 ENCOUNTER — Encounter (INDEPENDENT_AMBULATORY_CARE_PROVIDER_SITE_OTHER): Payer: Self-pay

## 2018-03-29 VITALS — BP 126/76 | HR 55 | Ht 68.0 in | Wt 193.0 lb

## 2018-03-29 DIAGNOSIS — G629 Polyneuropathy, unspecified: Secondary | ICD-10-CM | POA: Diagnosis not present

## 2018-03-29 NOTE — Patient Instructions (Signed)
-   monitor symptoms  - consider gabapentin in future  - consider EMG/NCS (electrical nerve testing) and lab testing in future

## 2018-03-29 NOTE — Progress Notes (Signed)
[facility]  PATIENT: [name] DOB: 07-01-1951  REFERRING CLINICIAN: Salena Saner, MD HISTORY FROM: patient  REASON FOR VISIT: new consult    HISTORICAL  CHIEF COMPLAINT:  Chief Complaint  Patient presents with  . Peripheral neuropathy    rm 7, New Pt, "numbness and shooting pain in feet; feel extremely hot/cold at times x 1 1/12 year"    HISTORY OF PRESENT ILLNESS:   67 year old male here for evaluation of lower immediate shooting pain in his feet.  For past 1-2 years patient has had swollen sensation on the bottom of his feet, hot and cold sensations, balance difficulty, left worse than right foot, with intermittent sharp shooting pains.  At times patient feels heat in his feet at times it feels cold.  Patient has some low back pain and stiffness.  Patient having some balance issues.  B12 322.   REVIEW OF SYSTEMS: Full 14 system review of systems performed and negative with exception of: Numbness weakness feeling hot feeling cold ringing in ears.  ALLERGIES: No Known Allergies  HOME MEDICATIONS: Outpatient Medications Prior to Visit  Medication Sig Dispense Refill  . aspirin 81 MG tablet Take 1 tablet (81 mg total) by mouth daily. 30 tablet   . irbesartan (AVAPRO) 75 MG tablet Take 75 mg by mouth daily.    . metoprolol tartrate (LOPRESSOR) 25 MG tablet Take 12.5 mg by mouth every morning.    . Multiple Vitamins-Minerals (ZINC PO) Take 1 capsule by mouth 3 (three) times a week.    . sildenafil (REVATIO) 20 MG tablet Take 40-100 mg by mouth daily as needed (erectile dysfunction).    Marland Kitchen acetaminophen (TYLENOL) 325 MG tablet Take 650 mg by mouth daily as needed for moderate pain or headache.     No facility-administered medications prior to visit.     PAST MEDICAL HISTORY: Past Medical History:  Diagnosis Date  . Coronary artery disease   . Heart disease   . Hypertension     PAST SURGICAL HISTORY: Past Surgical History:  Procedure Laterality  Date  . COLONOSCOPY N/A 03/11/2017   Procedure: COLONOSCOPY;  Surgeon: Rogene Houston, MD;  [facility];  Service: Endoscopy;  Laterality: N/A;  930  . CORONARY ARTERY BYPASS GRAFT  2014  . HERNIA REPAIR  2010  . POLYPECTOMY  03/11/2017   Procedure: POLYPECTOMY;  Surgeon: Rogene Houston, MD;  [facility];  Service: Endoscopy;;  colon  . ROTATOR CUFF REPAIR Right 2014  . vericous veins  2001    FAMILY HISTORY: Family History  Problem Relation Age of Onset  . Heart disease Father   . Dementia Sister   . Stroke Mother     SOCIAL HISTORY:  Social History   Socioeconomic History  . Marital status: Single    Spouse name: Not on file  . Number of children: 2  . Years of education: 40  . Highest education level: Not on file  Occupational History    Comment: retired  Scientific laboratory technician  . Financial resource strain: Not on file  . Food insecurity:    Worry: Not on file    Inability: Not on file  . Transportation needs:    Medical: Not on file    Non-medical: Not on file  Tobacco Use  . Smoking status: Never Smoker  . Smokeless tobacco: Never Used  Substance and Sexual Activity  . Alcohol use: Yes    Alcohol/week: 0.0 oz    Comment: Bourbon on  the weekends x 4  . Drug use: No  . Sexual activity: Not on file  Lifestyle  . Physical activity:    Days per week: Not on file    Minutes per session: Not on file  . Stress: Not on file  Relationships  . Social connections:    Talks on phone: Not on file    Gets together: Not on file    Attends religious service: Not on file    Active member of club or organization: Not on file    Attends meetings of clubs or organizations: Not on file    Relationship status: Not on file  . Intimate partner violence:    Fear of current or ex partner: Not on file    Emotionally abused: Not on file    Physically abused: Not on file    Forced sexual activity: Not on file  Other Topics Concern  . Not on file  Social  History Narrative   Lives alone   caffeine- quit 2017     PHYSICAL EXAM  GENERAL EXAM/CONSTITUTIONAL: Vitals:  Vitals:   03/29/18 0937  BP: 126/76  Pulse: (!) 55  Weight: 193 lb (87.5 kg)  Height: 5\' 8"  (1.727 m)     Body mass index is 29.35 kg/m.  Visual Acuity Screening   Right eye Left eye Both eyes  Without correction: 20/40 20/50   With correction:        Patient is in no distress; well developed, nourished and groomed; neck is supple  CARDIOVASCULAR:  Examination of carotid arteries is normal; no carotid bruits  Regular rate and rhythm, no murmurs  Examination of peripheral vascular system by observation and palpation is normal  EYES:  Ophthalmoscopic exam of optic discs and posterior segments is normal; no papilledema or hemorrhages  MUSCULOSKELETAL:  Gait, strength, tone, movements noted in Neurologic exam below  NEUROLOGIC: MENTAL STATUS:  No flowsheet data found.  awake, alert, oriented to person, place and time  recent and remote memory intact  normal attention and concentration  language fluent, comprehension intact, naming intact,   fund of knowledge appropriate  CRANIAL NERVE:   2nd - no papilledema on fundoscopic exam  2nd, 3rd, 4th, 6th - pupils equal and reactive to light, visual fields full to confrontation, extraocular muscles intact, no nystagmus  5th - facial sensation symmetric  7th - facial strength symmetric  8th - hearing intact  9th - palate elevates symmetrically, uvula midline  11th - shoulder shrug symmetric  12th - tongue protrusion midline  MOTOR:   normal bulk and tone, full strength in the BUE, BLE  MILD HIGH ARCH AND HAMMER TOES   SENSORY:   normal and symmetric to light touch, pinprick, temperature, vibration --> EXCEPT DECR PP, TEMP AND VIB AT TOES AND BOTTOM OF FEET  COORDINATION:   finger-nose-finger, fine finger movements normal  REFLEXES:   deep tendon reflexes --> BUE TRACE; BLE  AREFLEXIA  GAIT/STATION:   narrow based gait; able to walk on toes, heels and tandem; romberg is negative    DIAGNOSTIC DATA (LABS, IMAGING, TESTING) - I reviewed patient records, labs, notes, testing and imaging myself where available.  No results found for: WBC, HGB, HCT, MCV, PLT    Component Value Date/Time   NA 139 07/03/2015 0953   K 4.3 07/03/2015 0953   CL 103 07/03/2015 0953   CO2 26 07/03/2015 0953   GLUCOSE 88 07/03/2015 0953   BUN 21 07/03/2015 0953   CREATININE 0.98 07/03/2015  2876   CALCIUM 9.6 07/03/2015 0953   Lab Results  Component Value Date   CHOL 174 07/03/2015   HDL 34 (L) 07/03/2015   LDLCALC 120 (H) 07/03/2015   TRIG 102 07/03/2015   CHOLHDL 5.1 07/03/2015   No results found for: HGBA1C No results found for: VITAMINB12 No results found for: TSH   B12 322    ASSESSMENT AND PLAN  67 y.o. year old male here with 1-2 years of numbness, pain, balance difficulty, left worse than right foot, consistent with peripheral neuropathy.  Symptoms are mild at this time.  I offered additional testing or treatment but patient would like to monitor symptoms for now.   Dx:  1. Neuropathy      PLAN:  - monitor symptoms - consider gabapentin in future - consider EMG/NCS and lab testing in future  Return if symptoms worsen or fail to improve, for return to PCP.    Penni Bombard, MD 07/23/1571, 6:20 AM Certified in Neurology, Neurophysiology and Neuroimaging  [facility] [address], [place], [place] [address] [phone]

## 2018-08-04 DIAGNOSIS — I2510 Atherosclerotic heart disease of native coronary artery without angina pectoris: Secondary | ICD-10-CM | POA: Diagnosis not present

## 2018-08-04 DIAGNOSIS — E785 Hyperlipidemia, unspecified: Secondary | ICD-10-CM | POA: Diagnosis not present

## 2018-08-04 DIAGNOSIS — Z79899 Other long term (current) drug therapy: Secondary | ICD-10-CM | POA: Diagnosis not present

## 2018-08-04 DIAGNOSIS — Z125 Encounter for screening for malignant neoplasm of prostate: Secondary | ICD-10-CM | POA: Diagnosis not present

## 2018-08-04 DIAGNOSIS — I10 Essential (primary) hypertension: Secondary | ICD-10-CM | POA: Diagnosis not present

## 2018-08-10 DIAGNOSIS — I10 Essential (primary) hypertension: Secondary | ICD-10-CM | POA: Diagnosis not present

## 2018-08-10 DIAGNOSIS — I2510 Atherosclerotic heart disease of native coronary artery without angina pectoris: Secondary | ICD-10-CM | POA: Diagnosis not present

## 2018-08-10 DIAGNOSIS — Z23 Encounter for immunization: Secondary | ICD-10-CM | POA: Diagnosis not present

## 2018-09-16 ENCOUNTER — Encounter: Payer: Self-pay | Admitting: Cardiovascular Disease

## 2018-09-16 ENCOUNTER — Encounter: Payer: Self-pay | Admitting: *Deleted

## 2018-09-16 ENCOUNTER — Ambulatory Visit: Payer: PPO | Admitting: Cardiovascular Disease

## 2018-09-16 VITALS — BP 120/70 | HR 63 | Ht 68.0 in | Wt 183.6 lb

## 2018-09-16 DIAGNOSIS — I10 Essential (primary) hypertension: Secondary | ICD-10-CM

## 2018-09-16 DIAGNOSIS — Z23 Encounter for immunization: Secondary | ICD-10-CM | POA: Diagnosis not present

## 2018-09-16 DIAGNOSIS — I25708 Atherosclerosis of coronary artery bypass graft(s), unspecified, with other forms of angina pectoris: Secondary | ICD-10-CM

## 2018-09-16 DIAGNOSIS — E785 Hyperlipidemia, unspecified: Secondary | ICD-10-CM | POA: Diagnosis not present

## 2018-09-16 NOTE — Progress Notes (Signed)
SUBJECTIVE: The patient presents for follow-up of coronary artery disease with a history of CABG in 2014.  ECG performed in the office today which I ordered and personally interpreted demonstrates normal sinus rhythm with possible old anteroseptal infarct.  The patient denies any symptoms of chest pain, palpitations, shortness of breath, lightheadedness, dizziness, leg swelling, orthopnea, PND, and syncope.  He golfs 3 days/week, mows 7 lawns and does the weed eating and yard blowing, and exercises at the gym during the wintertime.     Review of Systems: As per "subjective", otherwise negative.  No Known Allergies  Current Outpatient Medications  Medication Sig Dispense Refill  . acetaminophen (TYLENOL) 325 MG tablet Take 650 mg by mouth daily as needed for moderate pain or headache.    Marland Kitchen aspirin 81 MG tablet Take 1 tablet (81 mg total) by mouth daily. 30 tablet   . irbesartan (AVAPRO) 75 MG tablet Take 75 mg by mouth daily.    . metoprolol tartrate (LOPRESSOR) 25 MG tablet Take 12.5 mg by mouth every morning.    . Multiple Vitamins-Minerals (ZINC PO) Take 1 capsule by mouth 3 (three) times a week.    . sildenafil (REVATIO) 20 MG tablet Take 40-100 mg by mouth daily as needed (erectile dysfunction).     No current facility-administered medications for this visit.     Past Medical History:  Diagnosis Date  . Coronary artery disease   . Heart disease   . Hypertension     Past Surgical History:  Procedure Laterality Date  . COLONOSCOPY N/A 03/11/2017   Procedure: COLONOSCOPY;  Surgeon: Rogene Houston, MD;  [facility];  Service: Endoscopy;  Laterality: N/A;  930  . CORONARY ARTERY BYPASS GRAFT  2014  . HERNIA REPAIR  2010  . POLYPECTOMY  03/11/2017   Procedure: POLYPECTOMY;  Surgeon: Rogene Houston, MD;  [facility];  Service: Endoscopy;;  colon  . ROTATOR CUFF REPAIR Right 2014  . vericous veins  2001    Social History   Socioeconomic  History  . Marital status: Single    Spouse name: Not on file  . Number of children: 2  . Years of education: 29  . Highest education level: Not on file  Occupational History    Comment: retired  Scientific laboratory technician  . Financial resource strain: Not on file  . Food insecurity:    Worry: Not on file    Inability: Not on file  . Transportation needs:    Medical: Not on file    Non-medical: Not on file  Tobacco Use  . Smoking status: Never Smoker  . Smokeless tobacco: Never Used  Substance and Sexual Activity  . Alcohol use: Yes    Alcohol/week: 0.0 standard drinks    Comment: Bourbon on the weekends x 4  . Drug use: No  . Sexual activity: Not on file  Lifestyle  . Physical activity:    Days per week: Not on file    Minutes per session: Not on file  . Stress: Not on file  Relationships  . Social connections:    Talks on phone: Not on file    Gets together: Not on file    Attends religious service: Not on file    Active member of club or organization: Not on file    Attends meetings of clubs or organizations: Not on file    Relationship status: Not on file  . Intimate partner violence:  Fear of current or ex partner: Not on file    Emotionally abused: Not on file    Physically abused: Not on file    Forced sexual activity: Not on file  Other Topics Concern  . Not on file  Social History Narrative   Lives alone   caffeine- quit 2017     Vitals:   09/16/18 1319  BP: 120/70  Pulse: 63  SpO2: 96%  Weight: 183 lb 9.6 oz (83.3 kg)  Height: 5\' 8"  (1.727 m)    Wt Readings from Last 3 Encounters:  09/16/18 183 lb 9.6 oz (83.3 kg)  03/29/18 193 lb (87.5 kg)  08/26/17 181 lb (82.1 kg)     PHYSICAL EXAM General: NAD HEENT: Normal. Neck: No JVD, no thyromegaly. Lungs: Clear to auscultation bilaterally with normal respiratory effort. CV: Regular rate and rhythm, normal S1/S2, no S3/S4, no murmur. No pretibial or periankle edema.  No carotid bruit.   Abdomen: Soft,  nontender, no distention.  Neurologic: Alert and oriented.  Psych: Normal affect. Skin: Normal. Musculoskeletal: No gross deformities.    ECG: Reviewed above under Subjective   Labs: Lab Results  Component Value Date/Time   K 4.3 07/03/2015 09:53 AM   BUN 21 07/03/2015 09:53 AM   CREATININE 0.98 07/03/2015 09:53 AM     Lipids: Lab Results  Component Value Date/Time   LDLCALC 120 (H) 07/03/2015 10:46 AM   CHOL 174 07/03/2015 10:46 AM   TRIG 102 07/03/2015 10:46 AM   HDL 34 (L) 07/03/2015 10:46 AM       ASSESSMENT AND PLAN:  1. CAD with 2-vessel CABG in 03/2013: Symptomatically stable. Continue ASA and metoprolol. Intolerant of statin therapy.   Will provide influenza vaccination.  2. Essential HTN: Well controlled. Continue irbesartan 75 mg daily.  3. Hyperlipidemia: Intolerant of low-dose lovastatin and Crestor. No longer on Zetia.  I will obtain a copy of lipids from PCP.  The patient told me his total cholesterol was 189 when most recently checked.     Disposition: Follow up 1 year   Kate Sable, M.D., F.A.C.C.

## 2018-09-16 NOTE — Patient Instructions (Addendum)

## 2019-02-22 DIAGNOSIS — I2510 Atherosclerotic heart disease of native coronary artery without angina pectoris: Secondary | ICD-10-CM | POA: Diagnosis not present

## 2019-02-22 DIAGNOSIS — I10 Essential (primary) hypertension: Secondary | ICD-10-CM | POA: Diagnosis not present

## 2019-02-22 DIAGNOSIS — Z6828 Body mass index (BMI) 28.0-28.9, adult: Secondary | ICD-10-CM | POA: Diagnosis not present

## 2019-04-25 ENCOUNTER — Ambulatory Visit (INDEPENDENT_AMBULATORY_CARE_PROVIDER_SITE_OTHER): Payer: PPO | Admitting: Otolaryngology

## 2019-04-25 DIAGNOSIS — H903 Sensorineural hearing loss, bilateral: Secondary | ICD-10-CM

## 2019-04-25 DIAGNOSIS — H6123 Impacted cerumen, bilateral: Secondary | ICD-10-CM

## 2019-05-26 DIAGNOSIS — Z23 Encounter for immunization: Secondary | ICD-10-CM | POA: Diagnosis not present

## 2019-05-26 DIAGNOSIS — M25512 Pain in left shoulder: Secondary | ICD-10-CM | POA: Diagnosis not present

## 2019-06-20 ENCOUNTER — Telehealth: Payer: Self-pay | Admitting: Internal Medicine

## 2019-06-20 DIAGNOSIS — Z20822 Contact with and (suspected) exposure to covid-19: Secondary | ICD-10-CM

## 2019-06-20 NOTE — Telephone Encounter (Signed)
Scheduled patient for COVID 19 test tomorrow morning at 8 am at [facility].  Testing protocol reviewed.

## 2019-06-20 NOTE — Telephone Encounter (Signed)
[name] with Dr. Kerrie Pleasure practice calling to schedule COVID 19 testing. Does not have Epic. No [facility] RN available.

## 2019-06-21 ENCOUNTER — Other Ambulatory Visit: Payer: PPO

## 2019-06-21 DIAGNOSIS — R6889 Other general symptoms and signs: Secondary | ICD-10-CM | POA: Diagnosis not present

## 2019-06-21 DIAGNOSIS — Z20822 Contact with and (suspected) exposure to covid-19: Secondary | ICD-10-CM

## 2019-06-29 ENCOUNTER — Telehealth: Payer: Self-pay | Admitting: Internal Medicine

## 2019-06-29 LAB — NOVEL CORONAVIRUS, NAA: SARS-CoV-2, NAA: NOT DETECTED

## 2019-06-29 NOTE — Telephone Encounter (Signed)
Patient called in for the results of his COVID-19 test.  He was told the test was negative, COVID Not Detected.

## 2019-08-19 DIAGNOSIS — Z125 Encounter for screening for malignant neoplasm of prostate: Secondary | ICD-10-CM | POA: Diagnosis not present

## 2019-08-19 DIAGNOSIS — I10 Essential (primary) hypertension: Secondary | ICD-10-CM | POA: Diagnosis not present

## 2019-08-19 DIAGNOSIS — I2510 Atherosclerotic heart disease of native coronary artery without angina pectoris: Secondary | ICD-10-CM | POA: Diagnosis not present

## 2019-08-19 DIAGNOSIS — Z79899 Other long term (current) drug therapy: Secondary | ICD-10-CM | POA: Diagnosis not present

## 2019-08-19 DIAGNOSIS — E785 Hyperlipidemia, unspecified: Secondary | ICD-10-CM | POA: Diagnosis not present

## 2019-09-01 DIAGNOSIS — N189 Chronic kidney disease, unspecified: Secondary | ICD-10-CM | POA: Diagnosis not present

## 2019-09-01 DIAGNOSIS — I2510 Atherosclerotic heart disease of native coronary artery without angina pectoris: Secondary | ICD-10-CM | POA: Diagnosis not present

## 2019-09-21 DIAGNOSIS — E875 Hyperkalemia: Secondary | ICD-10-CM | POA: Diagnosis not present

## 2019-09-21 DIAGNOSIS — I2510 Atherosclerotic heart disease of native coronary artery without angina pectoris: Secondary | ICD-10-CM | POA: Diagnosis not present

## 2019-09-21 DIAGNOSIS — N181 Chronic kidney disease, stage 1: Secondary | ICD-10-CM | POA: Diagnosis not present

## 2019-09-22 ENCOUNTER — Telehealth: Payer: Self-pay

## 2019-09-22 DIAGNOSIS — E785 Hyperlipidemia, unspecified: Secondary | ICD-10-CM

## 2019-09-22 NOTE — Telephone Encounter (Signed)
Referred patient to Lipid clinic, patient aware

## 2019-09-22 NOTE — Telephone Encounter (Signed)
-----   Message from Herminio Commons, MD sent at 09/02/2019  1:53 PM EDT ----- LDL. Start Repatha.

## 2019-09-23 ENCOUNTER — Telehealth: Payer: Self-pay | Admitting: Internal Medicine

## 2019-09-23 NOTE — Telephone Encounter (Signed)
LVM for patient to call and schedule appt with Dr. Debara Pickett for dyslipidemia.

## 2019-09-27 ENCOUNTER — Other Ambulatory Visit: Payer: Self-pay | Admitting: *Deleted

## 2019-09-27 DIAGNOSIS — Z20822 Contact with and (suspected) exposure to covid-19: Secondary | ICD-10-CM

## 2019-09-27 DIAGNOSIS — Z20828 Contact with and (suspected) exposure to other viral communicable diseases: Secondary | ICD-10-CM | POA: Diagnosis not present

## 2019-09-30 LAB — NOVEL CORONAVIRUS, NAA: SARS-CoV-2, NAA: NOT DETECTED

## 2019-10-05 DIAGNOSIS — Z23 Encounter for immunization: Secondary | ICD-10-CM | POA: Diagnosis not present

## 2019-10-10 ENCOUNTER — Ambulatory Visit: Payer: PPO | Admitting: Internal Medicine

## 2019-10-10 ENCOUNTER — Encounter: Payer: Self-pay | Admitting: Cardiovascular Disease

## 2019-10-10 ENCOUNTER — Encounter: Payer: Self-pay | Admitting: Internal Medicine

## 2019-10-10 ENCOUNTER — Telehealth: Payer: Self-pay | Admitting: Cardiovascular Disease

## 2019-10-10 ENCOUNTER — Other Ambulatory Visit: Payer: Self-pay

## 2019-10-10 ENCOUNTER — Ambulatory Visit (INDEPENDENT_AMBULATORY_CARE_PROVIDER_SITE_OTHER): Payer: PPO | Admitting: Cardiovascular Disease

## 2019-10-10 VITALS — BP 122/72 | HR 57 | Temp 97.2°F | Ht 68.0 in | Wt 187.2 lb

## 2019-10-10 VITALS — BP 128/74 | HR 50 | Ht 68.0 in | Wt 186.4 lb

## 2019-10-10 DIAGNOSIS — I25708 Atherosclerosis of coronary artery bypass graft(s), unspecified, with other forms of angina pectoris: Secondary | ICD-10-CM

## 2019-10-10 DIAGNOSIS — E785 Hyperlipidemia, unspecified: Secondary | ICD-10-CM

## 2019-10-10 DIAGNOSIS — I10 Essential (primary) hypertension: Secondary | ICD-10-CM | POA: Diagnosis not present

## 2019-10-10 DIAGNOSIS — T466X5A Adverse effect of antihyperlipidemic and antiarteriosclerotic drugs, initial encounter: Secondary | ICD-10-CM

## 2019-10-10 DIAGNOSIS — G720 Drug-induced myopathy: Secondary | ICD-10-CM

## 2019-10-10 NOTE — Telephone Encounter (Signed)
°  Precert needed for:  Echo   Location: [facility]   Date: Nov 10, 2019

## 2019-10-10 NOTE — Patient Instructions (Signed)
Your physician wants you to follow-up in: 1 YEAR WITH DR KONESWARAN You will receive a reminder letter in the mail two months in advance. If you don't receive a letter, please call our office to schedule the follow-up appointment.  Your physician recommends that you continue on your current medications as directed. Please refer to the Current Medication list given to you today.  Your physician has requested that you have an echocardiogram. Echocardiography is a painless test that uses sound waves to create images of your heart. It provides your doctor with information about the size and shape of your heart and how well your heart's chambers and valves are working. This procedure takes approximately one hour. There are no restrictions for this procedure.  Thank you for choosing [facility]!!    

## 2019-10-10 NOTE — Progress Notes (Signed)
SUBJECTIVE: The patient presents for follow-up of coronary artery disease with a history of CABG in 2014.  ECG performed today which appears review demonstrates sinus bradycardia, 47 bpm.  About 8 weeks ago when evening he ate fried okra and then had 2 bourbons.  He then took sildenafil and engaged in sexual intercourse.  He woke up at 3 AM with significant indigestion.  He felt faint and clammy and symptoms lasted for about 3 minutes.  He drank some milk and took 4 Tums and symptoms eventually subsided.  He currently denies any exertional chest pain and dyspnea.  He denies leg swelling, palpitations, orthopnea, fatigue, and paroxysmal nocturnal dyspnea    Review of Systems: As per "subjective", otherwise negative.  No Known Allergies  Current Outpatient Medications  Medication Sig Dispense Refill  . acetaminophen (TYLENOL) 325 MG tablet Take 650 mg by mouth daily as needed for moderate pain or headache.    Marland Kitchen aspirin 81 MG tablet Take 1 tablet (81 mg total) by mouth daily. 30 tablet   . irbesartan (AVAPRO) 75 MG tablet Take 75 mg by mouth daily.    . metoprolol tartrate (LOPRESSOR) 25 MG tablet Take 12.5 mg by mouth every morning.    . Multiple Vitamins-Minerals (ZINC PO) Take 1 capsule by mouth 3 (three) times a week.    . sildenafil (REVATIO) 20 MG tablet Take 40-100 mg by mouth daily as needed (erectile dysfunction).     No current facility-administered medications for this visit.     Past Medical History:  Diagnosis Date  . Coronary artery disease   . Heart disease   . Hypertension     Past Surgical History:  Procedure Laterality Date  . COLONOSCOPY N/A 03/11/2017   Procedure: COLONOSCOPY;  Surgeon: Rogene Houston, MD;  [facility];  Service: Endoscopy;  Laterality: N/A;  930  . CORONARY ARTERY BYPASS GRAFT  2014  . HERNIA REPAIR  2010  . POLYPECTOMY  03/11/2017   Procedure: POLYPECTOMY;  Surgeon: Rogene Houston, MD;  [facility];   Service: Endoscopy;;  colon  . ROTATOR CUFF REPAIR Right 2014  . vericous veins  2001    Social History   Socioeconomic History  . Marital status: Single    Spouse name: Not on file  . Number of children: 2  . Years of education: 13  . Highest education level: Not on file  Occupational History    Comment: retired  Scientific laboratory technician  . Financial resource strain: Not on file  . Food insecurity    Worry: Not on file    Inability: Not on file  . Transportation needs    Medical: Not on file    Non-medical: Not on file  Tobacco Use  . Smoking status: Never Smoker  . Smokeless tobacco: Never Used  Substance and Sexual Activity  . Alcohol use: Yes    Alcohol/week: 0.0 standard drinks    Comment: Bourbon on the weekends x 4  . Drug use: No  . Sexual activity: Not on file  Lifestyle  . Physical activity    Days per week: Not on file    Minutes per session: Not on file  . Stress: Not on file  Relationships  . Social Herbalist on phone: Not on file    Gets together: Not on file    Attends religious service: Not on file    Active member of club or organization: Not on file  Attends meetings of clubs or organizations: Not on file    Relationship status: Not on file  . Intimate partner violence    Fear of current or ex partner: Not on file    Emotionally abused: Not on file    Physically abused: Not on file    Forced sexual activity: Not on file  Other Topics Concern  . Not on file  Social History Narrative   Lives alone   caffeine- quit 2017     Vitals:   10/10/19 1053  BP: 128/74  Pulse: (!) 50  SpO2: 100%  Weight: 186 lb 6.4 oz (84.6 kg)  Height: 5\' 8"  (1.727 m)    Wt Readings from Last 3 Encounters:  10/10/19 186 lb 6.4 oz (84.6 kg)  10/10/19 187 lb 3.2 oz (84.9 kg)  09/16/18 183 lb 9.6 oz (83.3 kg)     PHYSICAL EXAM General: NAD HEENT: Normal. Neck: No JVD, no thyromegaly. Lungs: Clear to auscultation bilaterally with normal respiratory  effort. CV: Bradycardic, regular rhythm, normal S1/S2, no S3/S4, no murmur. No pretibial or periankle edema.  No carotid bruit.   Abdomen: Soft, nontender, no distention.  Neurologic: Alert and oriented.  Psych: Normal affect. Skin: Normal. Musculoskeletal: No gross deformities.      Labs: Lab Results  Component Value Date/Time   K 4.3 07/03/2015 09:53 AM   BUN 21 07/03/2015 09:53 AM   CREATININE 0.98 07/03/2015 09:53 AM     Lipids: Lab Results  Component Value Date/Time   LDLCALC 120 (H) 07/03/2015 10:46 AM   CHOL 174 07/03/2015 10:46 AM   TRIG 102 07/03/2015 10:46 AM   HDL 34 (L) 07/03/2015 10:46 AM       ASSESSMENT AND PLAN: 1. CAD with 2-vessel CABG in 03/2013: Symptomatically stable overall.  I suspect symptoms described above related to GERD as well as possible vasodilatation and resultant hypotension from a combination of alcohol consumption and sildenafil.  Continue ASA and metoprolol. Intolerant of statin therapy. LDL 109 on 08/19/2019.  I have recommended starting Repatha.  He was evaluated in the lipid clinic this morning. I will order a 2-D echocardiogram with Doppler to evaluate cardiac structure, function, and regional wall motion.  2. Essential HTN: Well controlled. Continue irbesartan 75 mg daily.  3. Hyperlipidemia: Intolerant of low-dose lovastatin and Crestor. LDL 109 on 08/19/2019.  I have recommended starting Repatha.  He was evaluated in the lipid clinic this morning.   Disposition: Follow up 1 yr   Kate Sable, M.D., F.A.C.C.

## 2019-10-10 NOTE — Patient Instructions (Addendum)
Medication Instructions:  Dr. Debara Pickett recommends Repatha/Praluent (PCSK9). This is an injectable cholesterol medication self-administered. This medication will need prior approval with your insurance company, which we will work on. If the medication is not approved initially, we may need to do an appeal with your insurance. We will keep you updated on this process.   If you need co-pay assistance, please look into the program at healthwellfoundation.org >> disease funds >> hypercholesterolemia. This is an online application or you can call to completed.     *If you need a refill on your cardiac medications before your next appointment, please call your pharmacy*  Lab Work: FASTING lab work to check cholesterol in 3-4 months (before next lipid clinic appointment) If you have labs (blood work) drawn today and your tests are completely normal, you will receive your results only by: Marland Kitchen MyChart Message (if you have MyChart) OR . A paper copy in the mail If you have any lab test that is abnormal or we need to change your treatment, we will call you to review the results.  Testing/Procedures: NONE  Follow-Up: Dr. Debara Pickett recommends that you schedule a follow up visit with him the in the [place] in 3-4 months. Please have fasting blood work about 1 week prior to this visit and he will review the blood work results with you at your appointment.

## 2019-10-10 NOTE — Progress Notes (Signed)
LIPID CLINIC CONSULT NOTE  Chief Complaint:  Has dyslipidemia  Primary Care Physician: Asencion Noble, MD  Primary Cardiologist:  Kate Sable, MD  HPI:  [name] is a 68 y.o. male who is being seen today for the evaluation of dyslipidemia at the request of Dr. Bronson Ing.  This is a pleasant 68 year old male with a history of coronary artery disease, hypertension and dyslipidemia.  He is followed by my partner Dr. Bronson Ing in [place] and was referred for evaluation and management of dyslipidemia.  His most recent labs drawn by his PCP in August 2020 showed total cholesterol 171, HDL 33, LDL 109 and triglycerides 143.  This target LDL is less than 70.  According to his cardiologist he had two-vessel bypass surgery in 2014.  He is also been intolerant of low-dose lovastatin and rosuvastatin.  He was also on ezetimibe but he discontinued that.  All of this is related to intolerances which included myalgias.  He does report a variable diet but is not significantly atherogenic.  He does remain physically active.  PMHx:  Past Medical History:  Diagnosis Date  . Coronary artery disease   . Heart disease   . Hypertension     Past Surgical History:  Procedure Laterality Date  . COLONOSCOPY N/A 03/11/2017   Procedure: COLONOSCOPY;  Surgeon: Rogene Houston, MD;  [facility];  Service: Endoscopy;  Laterality: N/A;  930  . CORONARY ARTERY BYPASS GRAFT  2014  . HERNIA REPAIR  2010  . POLYPECTOMY  03/11/2017   Procedure: POLYPECTOMY;  Surgeon: Rogene Houston, MD;  Location: [facility];  Service: Endoscopy;;  colon  . ROTATOR CUFF REPAIR Right 2014  . vericous veins  2001    FAMHx:  Family History  Problem Relation Age of Onset  . Heart disease Father   . Dementia Sister   . Stroke Mother     SOCHx:   reports that he has never smoked. He has never used smokeless tobacco. He reports current alcohol use. He reports that he does not use drugs.  ALLERGIES:   No Known Allergies  ROS: Pertinent items noted in HPI and remainder of comprehensive ROS otherwise negative.  HOME MEDS: Current Outpatient Medications on File Prior to Visit  Medication Sig Dispense Refill  . acetaminophen (TYLENOL) 325 MG tablet Take 650 mg by mouth daily as needed for moderate pain or headache.    Marland Kitchen aspirin 81 MG tablet Take 1 tablet (81 mg total) by mouth daily. 30 tablet   . irbesartan (AVAPRO) 75 MG tablet Take 75 mg by mouth daily.    . metoprolol tartrate (LOPRESSOR) 25 MG tablet Take 12.5 mg by mouth every morning.    . Multiple Vitamins-Minerals (ZINC PO) Take 1 capsule by mouth 3 (three) times a week.    . sildenafil (REVATIO) 20 MG tablet Take 40-100 mg by mouth daily as needed (erectile dysfunction).     No current facility-administered medications on file prior to visit.     LABS/IMAGING: No results found for this or any previous visit (from the past 48 hour(s)). No results found.  LIPID PANEL:    Component Value Date/Time   CHOL 174 07/03/2015 1046   TRIG 102 07/03/2015 1046   HDL 34 (L) 07/03/2015 1046   CHOLHDL 5.1 07/03/2015 1046   VLDL 20 07/03/2015 1046   LDLCALC 120 (H) 07/03/2015 1046    WEIGHTS: Wt Readings from Last 3 Encounters:  10/10/19 186 lb 6.4 oz (84.6 kg)  10/10/19 187 lb 3.2 oz (84.9 kg)  09/16/18 183 lb 9.6 oz (83.3 kg)    VITALS: BP 122/72   Pulse (!) 57   Temp (!) 97.2 F (36.2 C)   Ht 5\' 8"  (1.727 m)   Wt 187 lb 3.2 oz (84.9 kg)   SpO2 97%   BMI 28.46 kg/m   EXAM: General appearance: alert and no distress Neck: no carotid bruit, no JVD, thyroid not enlarged, symmetric, no tenderness/mass/nodules and bilateral hearing aids Lungs: clear to auscultation bilaterally Heart: regular rate and rhythm, S1, S2 normal, no murmur, click, rub or gallop Abdomen: soft, non-tender; bowel sounds normal; no masses,  no organomegaly Extremities: extremities normal, atraumatic, no cyanosis or edema Pulses: 2+ and symmetric  Skin: Skin color, texture, turgor normal. No rashes or lesions Neurologic: Mental status: Alert, oriented, thought content appropriate Psych: Pleasant  EKG: Deferred  ASSESSMENT: 1. Mixed dyslipidemia, goal LDL less than 70 2. Coronary disease status post two-vessel CABG (2014) 3. Hypertension 4. Statin and ezetimibe intolerance  PLAN: 1.   Mr. Delap's cholesterol remains well above target LDL less than 70 is recommended for his history of coronary disease and two-vessel bypass.  Unfortunately has been intolerant to statins and ezetimibe and is not currently on any therapy other than dietary management.  His most recent LDL was 109.  I am recommending starting Repatha which is likely to be well-tolerated. Prior authorization and prescribing will be through the lipid clinic.  We will plan a repeat lipid in about 3 to 4 months.  He has an appointment later today with his primary cardiologist later this morning.  Thanks for the kind referral.  Pixie Casino, MD, FACC, [place] Director of the Advanced Lipid Disorders &  Cardiovascular Risk Reduction Clinic Diplomate of the American Board of Clinical Lipidology Attending Cardiologist  Direct Dial: [phone]  Fax: [phone]  Website:  [email or web address] Myla [email or web address] 10/10/2019, 1:03 PM

## 2019-10-11 ENCOUNTER — Telehealth: Payer: Self-pay | Admitting: Internal Medicine

## 2019-10-11 NOTE — Telephone Encounter (Signed)
PA Case: [id number], Status: Approved, Coverage Starts on: 10/11/2019 12:00:00 AM, Coverage Ends on: 12/06/2019 12:00:00 AM

## 2019-10-11 NOTE — Telephone Encounter (Signed)
PA for repatha sureclick submitted via covermymeds.com [employer] part D coverage (Key: [id number])

## 2019-10-12 MED ORDER — REPATHA SURECLICK 140 MG/ML ~~LOC~~ SOAJ: 1.0000 | SUBCUTANEOUS | 11 refills | Status: DC

## 2019-10-12 NOTE — Telephone Encounter (Addendum)
LMTCB to discuss approval of repatha

## 2019-10-12 NOTE — Telephone Encounter (Signed)
Spoke with patient about medication approval. Rx(s) sent to pharmacy electronically - [facility]. Advised if co-pay not affordable, will need to look into healthwellfoundation.org for co-pay assistance or [employer].   Patient will need labs completed before PA exp date for re-auth

## 2019-10-12 NOTE — Addendum Note (Signed)
Addended by: Fidel Levy on: 10/12/2019 10:29 AM   Modules accepted: Orders

## 2019-10-17 NOTE — Telephone Encounter (Signed)
Re-scheduled to Nov 23, 2019

## 2019-11-10 ENCOUNTER — Other Ambulatory Visit: Payer: PPO

## 2019-11-15 ENCOUNTER — Telehealth: Payer: Self-pay | Admitting: Internal Medicine

## 2019-11-15 NOTE — Telephone Encounter (Signed)
Patient's prior auth approval expires 12/22/2019 Patient will need to complete fasting lab work by mid-Dec to resubmit PA (if he has been taking medication)  LMTCB to discuss this.

## 2019-11-21 ENCOUNTER — Telehealth: Payer: Self-pay | Admitting: Internal Medicine

## 2019-11-21 NOTE — Telephone Encounter (Signed)
Patient returning Jenna's call in regards to his prior auth approval expiration and fasting lab work.

## 2019-11-23 ENCOUNTER — Ambulatory Visit (INDEPENDENT_AMBULATORY_CARE_PROVIDER_SITE_OTHER): Payer: PPO

## 2019-11-23 ENCOUNTER — Other Ambulatory Visit: Payer: Self-pay

## 2019-11-23 DIAGNOSIS — I25708 Atherosclerosis of coronary artery bypass graft(s), unspecified, with other forms of angina pectoris: Secondary | ICD-10-CM | POA: Diagnosis not present

## 2019-11-23 NOTE — Telephone Encounter (Signed)
Spoke with patient. Notified him his PA expires 12/06/2019 and will need to be renewed. Asked that he have fasting lab work done by 12/11 for new PA to be submitted

## 2019-11-25 ENCOUNTER — Other Ambulatory Visit: Payer: Self-pay

## 2019-11-25 DIAGNOSIS — Z20822 Contact with and (suspected) exposure to covid-19: Secondary | ICD-10-CM

## 2019-11-27 LAB — NOVEL CORONAVIRUS, NAA: SARS-CoV-2, NAA: NOT DETECTED

## 2019-11-28 ENCOUNTER — Telehealth: Payer: Self-pay | Admitting: General Practice

## 2019-11-28 DIAGNOSIS — E785 Hyperlipidemia, unspecified: Secondary | ICD-10-CM | POA: Diagnosis not present

## 2019-11-28 NOTE — Telephone Encounter (Signed)
Negative COVID results given. Patient results "NOT Detected." Caller expressed understanding. ° °

## 2019-11-29 ENCOUNTER — Telehealth: Payer: Self-pay | Admitting: *Deleted

## 2019-11-29 LAB — LIPID PANEL
Chol/HDL Ratio: 3.1 ratio (ref 0.0–5.0)
Cholesterol, Total: 101 mg/dL (ref 100–199)
HDL: 33 mg/dL — ABNORMAL LOW (ref >39)
LDL Chol Calc (NIH): 44 mg/dL (ref 0–99)
Triglycerides: 137 mg/dL (ref 0–149)
VLDL Cholesterol Cal: 24 mg/dL (ref 5–40)

## 2019-11-29 NOTE — Telephone Encounter (Signed)
Notes recorded by Laurine Blazer, LPN on [id number] at [id number] AM EST  Patient notified, copy to pmd.  ------   Notes recorded by Herminio Commons, MD on 11/23/2019 at 10:31 AM EST  Normal pumping function with mild aortic root enlargement. I will monitor.

## 2019-11-30 ENCOUNTER — Telehealth: Payer: Self-pay | Admitting: Internal Medicine

## 2019-11-30 NOTE — Telephone Encounter (Signed)
Attempted PA for repatha via covermymeds.[email or web address] as authorization exp 12/06/2019   Form [employer] Medicare 4-Part NCPDP Electronic PA Form  Available without authorization per [email or web address].[email or web address]

## 2019-12-01 NOTE — Telephone Encounter (Signed)
Follow Up  Patient is returning call about medication. Please give patient a call back to assist.

## 2019-12-05 NOTE — Telephone Encounter (Signed)
LMTCB    Pixie Casino, MD  11/29/2019  4:38 PM EST    Cholesterol is at goal.   Dr Lemmie Evens

## 2019-12-05 NOTE — Telephone Encounter (Signed)
Patient called w/results °

## 2019-12-12 ENCOUNTER — Other Ambulatory Visit: Payer: Self-pay

## 2019-12-12 NOTE — Telephone Encounter (Signed)
covermymeds.com initiated a PA for repatha  Submitted today (Key: [id number])

## 2019-12-13 NOTE — Telephone Encounter (Signed)
PA Case: [id number], Status: Approved, Coverage Starts on: 12/13/2019 12:00:00 AM, Coverage Ends on: 12/12/2020 12:00:00 AM

## 2019-12-21 DIAGNOSIS — G720 Drug-induced myopathy: Secondary | ICD-10-CM | POA: Insufficient documentation

## 2020-01-17 ENCOUNTER — Ambulatory Visit: Payer: PPO | Admitting: Internal Medicine

## 2020-01-17 ENCOUNTER — Encounter: Payer: Self-pay | Admitting: Internal Medicine

## 2020-01-17 ENCOUNTER — Other Ambulatory Visit: Payer: Self-pay

## 2020-01-17 VITALS — BP 108/70 | HR 59 | Temp 96.8°F | Ht 68.0 in | Wt 187.8 lb

## 2020-01-17 DIAGNOSIS — T466X5A Adverse effect of antihyperlipidemic and antiarteriosclerotic drugs, initial encounter: Secondary | ICD-10-CM | POA: Diagnosis not present

## 2020-01-17 DIAGNOSIS — G720 Drug-induced myopathy: Secondary | ICD-10-CM | POA: Diagnosis not present

## 2020-01-17 DIAGNOSIS — E785 Hyperlipidemia, unspecified: Secondary | ICD-10-CM | POA: Diagnosis not present

## 2020-01-17 DIAGNOSIS — I25708 Atherosclerosis of coronary artery bypass graft(s), unspecified, with other forms of angina pectoris: Secondary | ICD-10-CM

## 2020-01-17 NOTE — Patient Instructions (Signed)
Medication Instructions:  Your physician recommends that you continue on your current medications as directed. Please refer to the Current Medication list given to you today.  *If you need a refill on your cardiac medications before your next appointment, please call your pharmacy*  Lab Work: FASTING lab work in 6 months to check cholesterol  If you have labs (blood work) drawn today and your tests are completely normal, you will receive your results only by: Marland Kitchen MyChart Message (if you have MyChart) OR . A paper copy in the mail If you have any lab test that is abnormal or we need to change your treatment, we will call you to review the results.  Testing/Procedures: NONE  Follow-Up: At [facility], you and your health needs are our priority.  As part of our continuing mission to provide you with exceptional heart care, we have created designated Provider Care Teams.  These Care Teams include your primary Cardiologist (physician) and Advanced Practice Providers (APPs -  Physician Assistants and Nurse Practitioners) who all work together to provide you with the care you need, when you need it.  Your next appointment:   12 month(s)  The format for your next appointment:   Either In Person or Virtual  Provider:   Raliegh Ip Mali Hilty, MD  Other Instructions

## 2020-01-17 NOTE — Progress Notes (Signed)
LIPID CLINIC CONSULT NOTE  Chief Complaint:  Follow-up dyslipidemia  Primary Care Physician: Asencion Noble, MD  Primary Cardiologist:  Kate Sable, MD  HPI:  Walter Lewandoski is a 69 y.o. male who is being seen today for the evaluation of dyslipidemia at the request of Dr. Bronson Ing.  This is a pleasant 69 year old male with a history of coronary artery disease, hypertension and dyslipidemia.  He is followed by my partner Dr. Bronson Ing in [place] and was referred for evaluation and management of dyslipidemia.  His most recent labs drawn by his PCP in August 2020 showed total cholesterol 171, HDL 33, LDL 109 and triglycerides 143.  This target LDL is less than 70.  According to his cardiologist he had two-vessel bypass surgery in 2014.  He is also been intolerant of low-dose lovastatin and rosuvastatin.  He was also on ezetimibe but he discontinued that.  All of this is related to intolerances which included myalgias.  He does report a variable diet but is not significantly atherogenic.  He does remain physically active.  01/17/2020  Mr. Lamance is seen today in follow-up.  Overall he is done well with Repatha and has tolerated it without any incidence.  He denies any myalgias.  Cholesterol is significantly improved, in fact his total is now 101, triglycerides 137, HDL 33 and LDL 44.  PMHx:  Past Medical History:  Diagnosis Date  . Coronary artery disease   . Heart disease   . Hypertension     Past Surgical History:  Procedure Laterality Date  . COLONOSCOPY N/A 03/11/2017   Procedure: COLONOSCOPY;  Surgeon: Rogene Houston, MD;  [facility];  Service: Endoscopy;  Laterality: N/A;  930  . CORONARY ARTERY BYPASS GRAFT  2014  . HERNIA REPAIR  2010  . POLYPECTOMY  03/11/2017   Procedure: POLYPECTOMY;  Surgeon: Rogene Houston, MD;  [facility];  Service: Endoscopy;;  colon  . ROTATOR CUFF REPAIR Right 2014  . vericous veins  2001    FAMHx:  Family  History  Problem Relation Age of Onset  . Heart disease Father   . Dementia Sister   . Stroke Mother     SOCHx:   reports that he has never smoked. He has never used smokeless tobacco. He reports current alcohol use. He reports that he does not use drugs.  ALLERGIES:  No Known Allergies  ROS: Pertinent items noted in HPI and remainder of comprehensive ROS otherwise negative.  HOME MEDS: Current Outpatient Medications on File Prior to Visit  Medication Sig Dispense Refill  . acetaminophen (TYLENOL) 325 MG tablet Take 650 mg by mouth daily as needed for moderate pain or headache.    Marland Kitchen aspirin 81 MG tablet Take 1 tablet (81 mg total) by mouth daily. 30 tablet   . Evolocumab (REPATHA SURECLICK) [id number] MG/ML SOAJ Inject 1 Dose into the skin every 14 (fourteen) days. 2 pen 11  . irbesartan (AVAPRO) 75 MG tablet Take 75 mg by mouth daily.    . metoprolol tartrate (LOPRESSOR) 25 MG tablet Take 12.5 mg by mouth every morning.    . Multiple Vitamins-Minerals (ZINC PO) Take 1 capsule by mouth 3 (three) times a week.    . sildenafil (REVATIO) 20 MG tablet Take 40-100 mg by mouth daily as needed (erectile dysfunction).     No current facility-administered medications on file prior to visit.    LABS/IMAGING: No results found for this or any previous visit (from the past 48  hour(s)). No results found.  LIPID PANEL:    Component Value Date/Time   CHOL 101 11/28/2019 0834   TRIG 137 11/28/2019 0834   HDL 33 (L) 11/28/2019 0834   CHOLHDL 3.1 11/28/2019 0834   CHOLHDL 5.1 07/03/2015 1046   VLDL 20 07/03/2015 1046   LDLCALC 44 11/28/2019 0834    WEIGHTS: Wt Readings from Last 3 Encounters:  01/17/20 187 lb 12.8 oz (85.2 kg)  10/10/19 186 lb 6.4 oz (84.6 kg)  10/10/19 187 lb 3.2 oz (84.9 kg)    VITALS: BP 108/70   Pulse (!) 59   Temp (!) 96.8 F (36 C)   Ht 5\' 8"  (1.727 m)   Wt 187 lb 12.8 oz (85.2 kg)   SpO2 100%   BMI 28.55 kg/m    EXAM: Deferred  EKG: Deferred  ASSESSMENT: 1. Mixed dyslipidemia, goal LDL less than 70 2. Coronary disease status post two-vessel CABG (2014) 3. Hypertension 4. Statin and ezetimibe intolerance  PLAN: 1.   Mr. Jakubczak has had significant improvement in his dyslipidemia on Repatha and now is a target LDL less than 70.  He is tolerating the medication without any the side effects he had on statins and ezetimibe.  He should continue on current therapy and has approval for Repatha until December 2021.  We will repeat lipids in 6 months and plan follow-up annually or sooner as necessary.  Pixie Casino, MD, [facility], [place] Director of the Advanced Lipid Disorders &  Cardiovascular Risk Reduction Clinic Diplomate of the American Board of Clinical Lipidology Attending Cardiologist  Direct Dial: [phone]  Fax: [phone]  Website:  [email or web address] 01/17/2020, 9:51 AM

## 2020-02-13 DIAGNOSIS — H6122 Impacted cerumen, left ear: Secondary | ICD-10-CM | POA: Diagnosis not present

## 2020-03-07 DIAGNOSIS — E785 Hyperlipidemia, unspecified: Secondary | ICD-10-CM | POA: Diagnosis not present

## 2020-03-07 DIAGNOSIS — I2510 Atherosclerotic heart disease of native coronary artery without angina pectoris: Secondary | ICD-10-CM | POA: Diagnosis not present

## 2020-03-07 DIAGNOSIS — G9009 Other idiopathic peripheral autonomic neuropathy: Secondary | ICD-10-CM | POA: Diagnosis not present

## 2020-04-17 DIAGNOSIS — G9009 Other idiopathic peripheral autonomic neuropathy: Secondary | ICD-10-CM | POA: Diagnosis not present

## 2020-04-17 DIAGNOSIS — Z6827 Body mass index (BMI) 27.0-27.9, adult: Secondary | ICD-10-CM | POA: Diagnosis not present

## 2020-07-17 DIAGNOSIS — R5383 Other fatigue: Secondary | ICD-10-CM | POA: Diagnosis not present

## 2020-07-17 DIAGNOSIS — Z79899 Other long term (current) drug therapy: Secondary | ICD-10-CM | POA: Diagnosis not present

## 2020-07-17 DIAGNOSIS — I10 Essential (primary) hypertension: Secondary | ICD-10-CM | POA: Diagnosis not present

## 2020-07-30 DIAGNOSIS — R5383 Other fatigue: Secondary | ICD-10-CM | POA: Diagnosis not present

## 2020-09-06 ENCOUNTER — Ambulatory Visit: Payer: PPO | Attending: Internal Medicine

## 2020-09-06 DIAGNOSIS — Z23 Encounter for immunization: Secondary | ICD-10-CM

## 2020-09-06 NOTE — Progress Notes (Signed)
   Covid-19 Vaccination Clinic  Name:  Brayden Brodhead    MRN: [id number] DOB: 1951-05-18  09/06/2020  Mr. Blackwell was observed post Covid-19 immunization for 15 minutes without incident. He was provided with Vaccine Information Sheet and instruction to access the V-Safe system.   Mr. Beamer was instructed to call 911 with any severe reactions post vaccine: Marland Kitchen Difficulty breathing  . Swelling of face and throat  . A fast heartbeat  . A bad rash all over body  . Dizziness and weakness

## 2020-09-24 DIAGNOSIS — I2510 Atherosclerotic heart disease of native coronary artery without angina pectoris: Secondary | ICD-10-CM | POA: Diagnosis not present

## 2020-09-24 DIAGNOSIS — Z79899 Other long term (current) drug therapy: Secondary | ICD-10-CM | POA: Diagnosis not present

## 2020-09-24 DIAGNOSIS — Z125 Encounter for screening for malignant neoplasm of prostate: Secondary | ICD-10-CM | POA: Diagnosis not present

## 2020-09-24 DIAGNOSIS — E785 Hyperlipidemia, unspecified: Secondary | ICD-10-CM | POA: Diagnosis not present

## 2020-09-24 DIAGNOSIS — I10 Essential (primary) hypertension: Secondary | ICD-10-CM | POA: Diagnosis not present

## 2020-10-01 DIAGNOSIS — I2510 Atherosclerotic heart disease of native coronary artery without angina pectoris: Secondary | ICD-10-CM | POA: Diagnosis not present

## 2020-10-01 DIAGNOSIS — G603 Idiopathic progressive neuropathy: Secondary | ICD-10-CM | POA: Diagnosis not present

## 2020-10-08 NOTE — Progress Notes (Signed)
Cardiology Office Note    Date:  10/09/2020   ID:  [name], DOB October 09, 1951, MRN [id number]  PCP:  Walter Noble, MD  Cardiologist: Walter Sable, MD (Inactive) --> Will switch to Dr. Domenic Bell  Chief Complaint  Patient presents with  . Follow-up    Annual Visit    History of Present Illness:    [name] is a 69 y.o. male with past medical history of CAD (s/p CABG in 2014 with LIMA-LAD and SVG-D1 - performed in [place]), HTN and HLD (statin intolerant) who presents to the office today for annual follow-up.   He was last examined by Dr. Bronson Bell in 09/2019 and reported an episode of chest discomfort which awakened him from sleep and resolved with TUMS. Denied any recent exertional symptoms. A follow-up echo was ordered and this showed a preserved EF of 60-65% and no regional WMA. He did have Grade 1 DD, low-normal RV function, mild MR, mild TR and mild dilation of the aortic root.   He did follow-up with [facility] in 12/2019 and his LDL had improved to 44 and he was continued on Repatha.   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 He also plays golf multiple times each week (rides the golf cart). He denies any recent chest pain or progressive dyspnea on exertion. He has noticed he is more deconditioned than he was several years ago but says he was walking for exercise at that time and plans to resume this in the near future. Denies any recent orthopnea, PND, lower extremity edema or palpitations. He does experience lower extremity neuropathy and was previously intolerant to Gabapentin. Was referred to Neurology by his PCP.   Past Medical History:  Diagnosis Date  . Coronary artery disease    a. s/p CABG in 2014 with LIMA-LAD and SVG-D1 - performed in [place]  . Heart disease   . Hypertension     Past Surgical History:  Procedure  Laterality Date  . COLONOSCOPY N/A 03/11/2017   Procedure: COLONOSCOPY;  Surgeon: Rogene Houston, MD;  [facility];  Service: Endoscopy;  Laterality: N/A;  930  . CORONARY ARTERY BYPASS GRAFT  2014  . HERNIA REPAIR  2010  . POLYPECTOMY  03/11/2017   Procedure: POLYPECTOMY;  Surgeon: Rogene Houston, MD;  [facility];  Service: Endoscopy;;  colon  . ROTATOR CUFF REPAIR Right 2014  . vericous veins  2001    Current Medications: Outpatient Medications Prior to Visit  Medication Sig Dispense Refill  . acetaminophen (TYLENOL) 325 MG tablet Take 650 mg by mouth daily as needed for moderate pain or headache.    Marland Kitchen aspirin 81 MG tablet Take 1 tablet (81 mg total) by mouth daily. 30 tablet   . Evolocumab (REPATHA SURECLICK) 194 MG/ML SOAJ Inject 1 Dose into the skin every 14 (fourteen) days. 2 pen 11  . irbesartan (AVAPRO) 75 MG tablet Take 75 mg by mouth daily.    . metoprolol tartrate (LOPRESSOR) 25 MG tablet Take 12.5 mg by mouth every morning.    . Multiple Vitamins-Minerals (ZINC PO) Take 1 capsule by mouth 3 (three) times a week.    . sildenafil (REVATIO) 20 MG tablet Take 40-100 mg by mouth daily as needed (erectile dysfunction).     No facility-administered medications prior to visit.  Allergies:   Patient has no known allergies.   Social History   Socioeconomic History  . Marital status: Single    Spouse name: Not on file  . Number of children: 2  . Years of education: 40  . Highest education level: Not on file  Occupational History    Comment: retired  Tobacco Use  . Smoking status: Never Smoker  . Smokeless tobacco: Never Used  Vaping Use  . Vaping Use: Never used  Substance and Sexual Activity  . Alcohol use: Yes    Alcohol/week: 0.0 standard drinks    Comment: Bourbon on the weekends x 4  . Drug use: No  . Sexual activity: Not on file  Other Topics Concern  . Not on file  Social History Narrative   Lives alone   caffeine- quit 2017    Social Determinants of Health   Financial Resource Strain:   . Difficulty of Paying Living Expenses: Not on file  Food Insecurity:   . Worried About Charity fundraiser in the Last Year: Not on file  . Ran Out of Food in the Last Year: Not on file  Transportation Needs:   . Lack of Transportation (Medical): Not on file  . Lack of Transportation (Non-Medical): Not on file  Physical Activity:   . Days of Exercise per Week: Not on file  . Minutes of Exercise per Session: Not on file  Stress:   . Feeling of Stress : Not on file  Social Connections:   . Frequency of Communication with Friends and Family: Not on file  . Frequency of Social Gatherings with Friends and Family: Not on file  . Attends Religious Services: Not on file  . Active Member of Clubs or Organizations: Not on file  . Attends Archivist Meetings: Not on file  . Marital Status: Not on file     Family History:  The patient's family history includes Dementia in his sister; Heart disease in his father; Stroke in his mother.   Review of Systems:   Please see the history of present illness.     General:  No chills, fever, night sweats or weight changes.  Cardiovascular:  No chest pain, dyspnea on exertion, edema, orthopnea, palpitations, paroxysmal nocturnal dyspnea. Dermatological: No rash, lesions/masses Respiratory: No cough, dyspnea Urologic: No hematuria, dysuria Abdominal:   No nausea, vomiting, diarrhea, bright red blood per rectum, melena, or hematemesis Neurologic:  No visual changes, wkns, changes in mental status. Positive for neuropathy.   All other systems reviewed and are otherwise negative except as noted above.   Physical Exam:    VS:  BP 114/76   Pulse 66   Ht 5\' 8"  (1.727 m)   Wt 186 lb 6.4 oz (84.6 kg)   SpO2 97%   BMI 28.34 kg/m    General: Well developed, well nourished,male appearing in no acute distress. Head: Normocephalic, atraumatic. Neck: No carotid bruits. JVD not  elevated.  Lungs: Respirations regular and unlabored, without wheezes or rales.  Heart: Regular rate and rhythm. No S3 or S4.  No murmur, no rubs, or gallops appreciated. Abdomen: Appears non-distended. No obvious abdominal masses. Msk:  Strength and tone appear normal for age. No obvious joint deformities or effusions. Extremities: No clubbing or cyanosis. No lower extremity edema.  Distal pedal pulses are 2+ bilaterally. Neuro: Alert and oriented X 3. Moves all extremities spontaneously. No focal deficits noted. Psych:  Responds to questions appropriately with a normal affect. Skin: No rashes or  lesions noted  Wt Readings from Last 3 Encounters:  10/09/20 186 lb 6.4 oz (84.6 kg)  01/17/20 187 lb 12.8 oz (85.2 kg)  10/10/19 186 lb 6.4 oz (84.6 kg)     Studies/Labs Reviewed:   EKG:  EKG is ordered today.  The ekg ordered today demonstrates NSR, HR 72 with anterior infarct pattern. No acute ST changes when compared to prior tracings.   Recent Labs: No results found for requested labs within last 8760 hours.   Lipid Panel    Component Value Date/Time   CHOL 101 11/28/2019 0834   TRIG 137 11/28/2019 0834   HDL 33 (L) 11/28/2019 0834   CHOLHDL 3.1 11/28/2019 0834   CHOLHDL 5.1 07/03/2015 1046   VLDL 20 07/03/2015 1046   LDLCALC 44 11/28/2019 0834    Additional studies/ records that were reviewed today include:   Echocardiogram: 11/2019 IMPRESSIONS    1. Left ventricular ejection fraction, by visual estimation, is 60 to  65%. The left ventricle has normal function. There is no left ventricular  hypertrophy.  2. Left ventricular diastolic parameters are consistent with Grade I  diastolic dysfunction (impaired relaxation).  3. The left ventricle has no regional wall motion abnormalities.  4. Global right ventricle has low normal systolic function.The right  ventricular size is normal. No increase in right ventricular wall  thickness.  5. Left atrial size was normal.    6. Right atrial size was mildly dilated.  7. Mild mitral annular calcification.  8. The mitral valve is degenerative. Mild mitral valve regurgitation.  9. The tricuspid valve is grossly normal. Tricuspid valve regurgitation  is mild.  10. The aortic valve is tricuspid. Aortic valve regurgitation is not  visualized. No evidence of aortic valve sclerosis or stenosis.  11. The pulmonic valve was grossly normal. Pulmonic valve regurgitation is  mild.  12. Aortic dilatation noted.  13. There is mild dilatation of the aortic root.  14. Normal pulmonary artery systolic pressure.  15. The inferior vena cava is normal in size with greater than 50%  respiratory variability, suggesting right atrial pressure of 3 mmHg.   Assessment:    1. Coronary artery disease involving coronary bypass graft of native heart without angina pectoris   2. Hyperlipidemia LDL goal <70   3. Essential hypertension      Plan:   In order of problems listed above:  1. CAD - He is s/p CABG in 2014 with LIMA-LAD and SVG-D1. He did undergo a recent echocardiogram in 11/2019 which showed a preserved EF of 60-65% and no regional WMA.  We discussed obtaining a repeat stress test once 10 years out from CABG unless symptoms indicate a more prompt evaluation. - No recent anginal symptoms as outlined above. He does plan to establish a more routine exercise program. - Continue ASA 81 mg daily, Lopressor and Repatha.  2. HLD - He was statin intolerant in the past and was referred to the [facility] with Repatha being initiated. LDL was improved to 44 by labs in 11/2019 and he reports more recent labs with Dr. Willey Blade in the interim. Will request a copy of the most recent records.   3. HTN - BP is well controlled at 114/76 during today's visit. Continue Irbesartan 37.5 mg daily and Lopressor 12.5 mg daily (has always taken this once daily and never BID).    Medication Adjustments/Labs and Tests Ordered: Current medicines  are reviewed at length with the patient today.  Concerns regarding medicines are outlined above.  Medication changes, Labs and Tests ordered today are listed in the Patient Instructions below. Patient Instructions  Medication Instructions:  Your physician recommends that you continue on your current medications as directed. Please refer to the Current Medication list given to you today.  *If you need a refill on your cardiac medications before your next appointment, please call your pharmacy*   Lab Work: NONE   If you have labs (blood work) drawn today and your tests are completely normal, you will receive your results only by: Marland Kitchen MyChart Message (if you have MyChart) OR . A paper copy in the mail If you have any lab test that is abnormal or we need to change your treatment, we will call you to review the results.   Testing/Procedures: NONE   Follow-Up: At [facility], you and your health needs are our priority.  As part of our continuing mission to provide you with exceptional heart care, we have created designated Provider Care Teams.  These Care Teams include your primary Cardiologist (physician) and Advanced Practice Providers (APPs -  Physician Assistants and Nurse Practitioners) who all work together to provide you with the care you need, when you need it.  We recommend signing up for the patient portal called "MyChart".  Sign up information is provided on this After Visit Summary.  MyChart is used to connect with patients for Virtual Visits (Telemedicine).  Patients are able to view lab/test results, encounter notes, upcoming appointments, etc.  Non-urgent messages can be sent to your provider as well.   To learn more about what you can do with MyChart, go to [email or web address].    Your next appointment:   1 year(s)  The format for your next appointment:   In Person  Provider:   Rozann Lesches, MD or Bernerd Pho, PA-C   Other Instructions Thank you for  choosing [facility]!     Signed, Erma Heritage, PA-C  10/09/2020 4:42 PM    [place] [address] [place], [place] [address] Phone: [phone] Fax: [phone]

## 2020-10-09 ENCOUNTER — Encounter: Payer: Self-pay | Admitting: Student

## 2020-10-09 ENCOUNTER — Encounter: Payer: Self-pay | Admitting: *Deleted

## 2020-10-09 ENCOUNTER — Ambulatory Visit: Payer: PPO | Admitting: Student

## 2020-10-09 ENCOUNTER — Other Ambulatory Visit: Payer: Self-pay

## 2020-10-09 VITALS — BP 114/76 | HR 66 | Ht 68.0 in | Wt 186.4 lb

## 2020-10-09 DIAGNOSIS — E785 Hyperlipidemia, unspecified: Secondary | ICD-10-CM | POA: Diagnosis not present

## 2020-10-09 DIAGNOSIS — I10 Essential (primary) hypertension: Secondary | ICD-10-CM

## 2020-10-09 DIAGNOSIS — I25810 Atherosclerosis of coronary artery bypass graft(s) without angina pectoris: Secondary | ICD-10-CM | POA: Diagnosis not present

## 2020-10-09 NOTE — Patient Instructions (Signed)
Medication Instructions:  Your physician recommends that you continue on your current medications as directed. Please refer to the Current Medication list given to you today.  *If you need a refill on your cardiac medications before your next appointment, please call your pharmacy*   Lab Work: NONE   If you have labs (blood work) drawn today and your tests are completely normal, you will receive your results only by: MyChart Message (if you have MyChart) OR A paper copy in the mail If you have any lab test that is abnormal or we need to change your treatment, we will call you to review the results.   Testing/Procedures: NONE    Follow-Up: At [facility], you and your health needs are our priority.  As part of our continuing mission to provide you with exceptional heart care, we have created designated Provider Care Teams.  These Care Teams include your primary Cardiologist (physician) and Advanced Practice Providers (APPs -  Physician Assistants and Nurse Practitioners) who all work together to provide you with the care you need, when you need it.  We recommend signing up for the patient portal called "MyChart".  Sign up information is provided on this After Visit Summary.  MyChart is used to connect with patients for Virtual Visits (Telemedicine).  Patients are able to view lab/test results, encounter notes, upcoming appointments, etc.  Non-urgent messages can be sent to your provider as well.   To learn more about what you can do with MyChart, go to [email or web address].    Your next appointment:   1 year(s)  The format for your next appointment:   In Person  Provider:   Samuel McDowell, MD or Brittany Strader, PA-C   Other Instructions Thank you for choosing  [facility]!    

## 2020-10-22 DIAGNOSIS — M791 Myalgia, unspecified site: Secondary | ICD-10-CM | POA: Diagnosis not present

## 2020-10-22 DIAGNOSIS — Z23 Encounter for immunization: Secondary | ICD-10-CM | POA: Diagnosis not present

## 2020-10-22 DIAGNOSIS — M6208 Separation of muscle (nontraumatic), other site: Secondary | ICD-10-CM | POA: Diagnosis not present

## 2020-10-31 ENCOUNTER — Encounter: Payer: Self-pay | Admitting: *Deleted

## 2020-11-05 ENCOUNTER — Ambulatory Visit: Payer: PPO | Admitting: Diagnostic Neuroimaging

## 2020-11-05 ENCOUNTER — Other Ambulatory Visit: Payer: Self-pay | Admitting: Internal Medicine

## 2020-11-05 ENCOUNTER — Encounter: Payer: Self-pay | Admitting: Diagnostic Neuroimaging

## 2020-11-05 ENCOUNTER — Other Ambulatory Visit: Payer: Self-pay

## 2020-11-05 VITALS — BP 119/69 | HR 64 | Ht 68.0 in | Wt 187.4 lb

## 2020-11-05 DIAGNOSIS — G629 Polyneuropathy, unspecified: Secondary | ICD-10-CM | POA: Diagnosis not present

## 2020-11-05 MED ORDER — PREGABALIN 50 MG PO CAPS: 50.0000 mg | ORAL_CAPSULE | Freq: Two times a day (BID) | ORAL | 12 refills | Status: DC

## 2020-11-05 NOTE — Progress Notes (Signed)
[facility]  PATIENT: [name] DOB: 01/17/1951  REFERRING CLINICIAN: Salena Saner, MD HISTORY FROM: patient  REASON FOR VISIT: new consult    HISTORICAL  CHIEF COMPLAINT:  Chief Complaint  Patient presents with  . Peripheral Neuropathy    rm 7 "progressing peripheral neuropathy; numbness and pain in my feet, Gabapentin caused fatigue"     HISTORY OF PRESENT ILLNESS:   UPDATE (11/05/20, VRP): Since last visit, neuropathy symptoms have gradually progressed.  Now having intermittent shooting pains, burning sensation in his feet.  Patient tried gabapentin a few years ago but this caused side effects of fatigue.  He is willing to trial medication.  Also willing to try some other testing.  Symptoms mainly affecting his bilateral feet, slightly coming up to his shins.  PRIOR HPI (96): 69 year old male here for evaluation of lower immediate shooting pain in his feet.  For past 1-2 years patient has had swollen sensation on the bottom of his feet, hot and cold sensations, balance difficulty, left worse than right foot, with intermittent sharp shooting pains.  At times patient feels heat in his feet at times it feels cold.  Patient has some low back pain and stiffness.  Patient having some balance issues.  B12 322.   REVIEW OF SYSTEMS: Full 14 system review of systems performed and negative with exception of: as per HPI.   ALLERGIES: Allergies  Allergen Reactions  . Gabapentin Other (See Comments)    fatigue    HOME MEDICATIONS: Outpatient Medications Prior to Visit  Medication Sig Dispense Refill  . acetaminophen (TYLENOL) 325 MG tablet Take 650 mg by mouth daily as needed for moderate pain or headache.    Marland Kitchen aspirin 81 MG tablet Take 1 tablet (81 mg total) by mouth daily. 30 tablet   . irbesartan (AVAPRO) 75 MG tablet Take 75 mg by mouth daily.    . metoprolol tartrate (LOPRESSOR) 25 MG tablet Take 12.5 mg by mouth every morning.    . Multiple  Vitamins-Minerals (ZINC PO) Take 1 capsule by mouth 3 (three) times a week.    Marland Kitchen REPATHA SURECLICK 161 MG/ML SOAJ INJECT 1 DOSE INTO THE SKIN EVERY 14 DAYS. 2 mL 11  . sildenafil (REVATIO) 20 MG tablet Take 40-100 mg by mouth daily as needed (erectile dysfunction).     No facility-administered medications prior to visit.    PAST MEDICAL HISTORY: Past Medical History:  Diagnosis Date  . Coronary artery disease    a. s/p CABG in 2014 with LIMA-LAD and SVG-D1 - performed in [place]  . Heart disease   . Hyperlipidemia   . Hypertension   . Peripheral neuropathy     PAST SURGICAL HISTORY: Past Surgical History:  Procedure Laterality Date  . COLONOSCOPY N/A 03/11/2017   Procedure: COLONOSCOPY;  Surgeon: Rogene Houston, MD;  [facility];  Service: Endoscopy;  Laterality: N/A;  930  . CORONARY ARTERY BYPASS GRAFT  2014  . HERNIA REPAIR  2010  . POLYPECTOMY  03/11/2017   Procedure: POLYPECTOMY;  Surgeon: Rogene Houston, MD;  [facility];  Service: Endoscopy;;  colon  . ROTATOR CUFF REPAIR Right 2014  . vericous veins  2001    FAMILY HISTORY: Family History  Problem Relation Age of Onset  . Heart disease Father   . Dementia Sister   . Thyroid disease Sister   . Hypertension Sister   . Stroke Mother     SOCIAL HISTORY:  Social History   Socioeconomic  History  . Marital status: Single    Spouse name: Not on file  . Number of children: 2  . Years of education: 74  . Highest education level: Not on file  Occupational History    Comment: retired  Tobacco Use  . Smoking status: Never Smoker  . Smokeless tobacco: Never Used  Vaping Use  . Vaping Use: Never used  Substance and Sexual Activity  . Alcohol use: Yes    Alcohol/week: 0.0 standard drinks    Comment: Bourbon on the weekends x 4  . Drug use: No  . Sexual activity: Not on file  Other Topics Concern  . Not on file  Social History Narrative   Lives with sig other   caffeine-  A little    Social Determinants of Health   Financial Resource Strain:   . Difficulty of Paying Living Expenses: Not on file  Food Insecurity:   . Worried About Charity fundraiser in the Last Year: Not on file  . Ran Out of Food in the Last Year: Not on file  Transportation Needs:   . Lack of Transportation (Medical): Not on file  . Lack of Transportation (Non-Medical): Not on file  Physical Activity:   . Days of Exercise per Week: Not on file  . Minutes of Exercise per Session: Not on file  Stress:   . Feeling of Stress : Not on file  Social Connections:   . Frequency of Communication with Friends and Family: Not on file  . Frequency of Social Gatherings with Friends and Family: Not on file  . Attends Religious Services: Not on file  . Active Member of Clubs or Organizations: Not on file  . Attends Archivist Meetings: Not on file  . Marital Status: Not on file  Intimate Partner Violence:   . Fear of Current or Ex-Partner: Not on file  . Emotionally Abused: Not on file  . Physically Abused: Not on file  . Sexually Abused: Not on file     PHYSICAL EXAM  GENERAL EXAM/CONSTITUTIONAL: Vitals:  Vitals:   11/05/20 1629  BP: 119/69  Pulse: 64  Weight: 187 lb 6.4 oz (85 kg)  Height: _0  (1.727 m)   Body mass index is 28.49 kg/m. No exam data present  Patient is in no distress; well developed, nourished and groomed; neck is supple  CARDIOVASCULAR:  Examination of carotid arteries is normal; no carotid bruits  Regular rate and rhythm, no murmurs  Examination of peripheral vascular system by observation and palpation is normal  EYES:  Ophthalmoscopic exam of optic discs and posterior segments is normal; no papilledema or hemorrhages  MUSCULOSKELETAL:  Gait, strength, tone, movements noted in Neurologic exam below  NEUROLOGIC: MENTAL STATUS:  No flowsheet data found.  awake, alert, oriented to person, place and time  recent and remote memory  intact  normal attention and concentration  language fluent, comprehension intact, naming intact,   fund of knowledge appropriate  CRANIAL NERVE:   2nd - no papilledema on fundoscopic exam  2nd, 3rd, 4th, 6th - pupils equal and reactive to light, visual fields full to confrontation, extraocular muscles intact, no nystagmus  5th - facial sensation symmetric  7th - facial strength symmetric  8th - hearing intact  9th - palate elevates symmetrically, uvula midline  11th - shoulder shrug symmetric  12th - tongue protrusion midline  MOTOR:   normal bulk and tone, full strength in the BUE, BLE  MILD HIGH ARCH AND HAMMER  TOES   SENSORY:   normal and symmetric to light touch, pinprick, temperature, vibration --> EXCEPT DECR IN FEET  COORDINATION:   finger-nose-finger, fine finger movements normal  REFLEXES:   deep tendon reflexes --> BUE TRACE; BLE AREFLEXIA  GAIT/STATION:   narrow based gait    DIAGNOSTIC DATA (LABS, IMAGING, TESTING) - I reviewed patient records, labs, notes, testing and imaging myself where available.  No results found for: WBC, HGB, HCT, MCV, PLT    Component Value Date/Time   NA 139 07/03/2015 0953   K 4.3 07/03/2015 0953   CL 103 07/03/2015 0953   CO2 26 07/03/2015 0953   GLUCOSE 88 07/03/2015 0953   BUN 21 07/03/2015 0953   CREATININE 0.98 07/03/2015 0953   CALCIUM 9.6 07/03/2015 0953   Lab Results  Component Value Date   CHOL 101 11/28/2019   HDL 33 (L) 11/28/2019   LDLCALC 44 11/28/2019   TRIG 137 11/28/2019   CHOLHDL 3.1 11/28/2019   No results found for: HGBA1C No results found for: VITAMINB12 No results found for: TSH   B12 322    ASSESSMENT AND PLAN  69 y.o. year old male here with 1-2 years of numbness, pain, balance difficulty, left worse than right foot, consistent with peripheral neuropathy.    Meds tried: gabapentin   Dx:  1. Neuropathy      PLAN:  - check neuropathy labs - pregabalin 67m at  bedtime; may increase to twice a day - consider capsaicin cream, lidocaine patch / cream, alpha-lipoic acid 6073mdaily  Orders Placed This Encounter  Procedures  . Vitamin B12    *Please order MMA (L([id number] [phone] Homocysteine (LAB93 / [id number] B12 < 300.*  . MMA    *Perform MMA ([id number] [phone] if Vitamin B12 < 300.*  . Homocysteine    *Perform Homocysteine (LAB93 / [id number] if Vitamin B12 < 300.*  . A1c  . TSH  . SPEP with IFE  . ANA w/Reflex  . SSA, SSB  . ESR  . CRP  . ANCA  . Copper  . Vitamin B6  . Vitamin B1  . Hepatitis C antibody  . Hepatitis B core antibody, total  . Hepatitis B surface antigen  . Hepatitis B surface antibody, qualitative  . RPR  . HIV    Meds ordered this encounter  Medications  . pregabalin (LYRICA) 50 MG capsule    Sig: Take 1 capsule (50 mg total) by mouth 2 (two) times daily.    Dispense:  60 capsule    Refill:  12   Return in about 6 months (around 05/05/2021) for with NP (Amy Lomax).    VIPenni BombardMD 1157/84/69624:9:52M Certified in Neurology, Neurophysiology and Neuroimaging  [facility] [address]SuLancasterrBardm[place] [phone]

## 2020-11-05 NOTE — Telephone Encounter (Signed)
Rx has been sent to the pharmacy electronically. ° °

## 2020-11-05 NOTE — Patient Instructions (Signed)
-   check neuropathy labs  - pregabalin 50mg  at bedtime; may increase to twice a day  - consider capsaicin cream, lidocaine patch / cream, alpha-lipoic acid 600mg  daily

## 2020-11-06 DIAGNOSIS — G629 Polyneuropathy, unspecified: Secondary | ICD-10-CM | POA: Diagnosis not present

## 2020-11-07 LAB — HOMOCYSTEINE: Homocysteine: 13.6 umol/L (ref 0.0–19.2)

## 2020-11-08 LAB — ANA W/REFLEX

## 2020-11-08 LAB — HEMOGLOBIN A1C: Hgb A1c MFr Bld: 5.5 % (ref 4.8–5.6)

## 2020-11-12 ENCOUNTER — Telehealth: Payer: Self-pay | Admitting: *Deleted

## 2020-11-12 DIAGNOSIS — R768 Other specified abnormal immunological findings in serum: Secondary | ICD-10-CM

## 2020-11-12 DIAGNOSIS — G629 Polyneuropathy, unspecified: Secondary | ICD-10-CM

## 2020-11-12 NOTE — Telephone Encounter (Signed)
I spoke with patient and advised him of Dr Gladstone Lighter result note and recommendations. He will check his MVI for B6 amount. He agrees to rheumatology referral. Patient verbalized understanding, appreciation. Referral placed.

## 2020-11-12 NOTE — Telephone Encounter (Signed)
ANA is positive. Could indicate autoimmune neuropathy. Recommend rheumatology eval. B6 slightly elevated. Recommend to reduce supplement if taking, to less than 25mg  of B6 daily.  -VRP

## 2020-11-14 LAB — HEMOGLOBIN A1C: Est. average glucose Bld gHb Est-mCnc: 111 mg/dL

## 2020-11-22 LAB — SJOGREN'S SYNDROME ANTIBODS(SSA + SSB)
ENA SSA (RO) Ab: <0.2 AI (ref 0.0–0.9)
ENA SSB (LA) Ab: <0.2 AI (ref 0.0–0.9)

## 2020-11-22 LAB — RPR: RPR Ser Ql: NONREACTIVE

## 2020-11-22 LAB — VITAMIN B6: Vitamin B6: 57.4 ug/L — ABNORMAL HIGH (ref 5.3–46.7)

## 2020-11-22 LAB — HEPATITIS B SURFACE ANTIBODY,QUALITATIVE: Hep B Surface Ab, Qual: NONREACTIVE

## 2020-11-22 LAB — VITAMIN B1: Thiamine: 166.8 nmol/L (ref 66.5–200.0)

## 2020-11-22 LAB — PAN-ANCA
ANCA Proteinase 3: <3.5 U/mL (ref 0.0–3.5)
Atypical pANCA: <1:20 {titer}
C-ANCA: <1:20 {titer}
Myeloperoxidase Ab: <9 U/mL (ref 0.0–9.0)
P-ANCA: <1:20 {titer}

## 2020-11-22 LAB — ENA+DNA/DS+ANTICH+CENTRO+FA...
Anti JO-1: <0.2 AI (ref 0.0–0.9)
Antiribosomal P Antibodies: <0.2 AI (ref 0.0–0.9)
Centromere Ab Screen: <0.2 AI (ref 0.0–0.9)
Chromatin Ab SerPl-aCnc: <0.2 AI (ref 0.0–0.9)
ENA RNP Ab: <0.2 AI (ref 0.0–0.9)
ENA SM Ab Ser-aCnc: <0.2 AI (ref 0.0–0.9)
Homogeneous Pattern: 1:320 {titer} — ABNORMAL HIGH
Scleroderma (Scl-70) (ENA) Antibody, IgG: <0.2 AI (ref 0.0–0.9)
Smith/RNP Antibodies: <0.2 AI (ref 0.0–0.9)
dsDNA Ab: 1 IU/mL (ref 0–9)

## 2020-11-22 LAB — MULTIPLE MYELOMA PANEL, SERUM
Albumin SerPl Elph-Mcnc: 4 g/dL (ref 2.9–4.4)
Albumin/Glob SerPl: 1.3 (ref 0.7–1.7)
Alpha 1: 0.2 g/dL (ref 0.0–0.4)
Alpha2 Glob SerPl Elph-Mcnc: 0.6 g/dL (ref 0.4–1.0)
B-Globulin SerPl Elph-Mcnc: 1.1 g/dL (ref 0.7–1.3)
Gamma Glob SerPl Elph-Mcnc: 1.3 g/dL (ref 0.4–1.8)
Globulin, Total: 3.3 g/dL (ref 2.2–3.9)
IgA/Immunoglobulin A, Serum: 369 mg/dL (ref 61–437)
IgG (Immunoglobin G), Serum: 1358 mg/dL (ref 603–1613)
IgM (Immunoglobulin M), Srm: 60 mg/dL (ref 15–143)
Total Protein: 7.3 g/dL (ref 6.0–8.5)

## 2020-11-22 LAB — COPPER, SERUM: Copper: 99 ug/dL (ref 69–132)

## 2020-11-22 LAB — METHYLMALONIC ACID, SERUM: Methylmalonic Acid: 182 nmol/L (ref 0–378)

## 2020-11-22 LAB — HEPATITIS B SURFACE ANTIGEN: Hepatitis B Surface Ag: NEGATIVE

## 2020-11-22 LAB — TSH: TSH: 2.64 u[IU]/mL (ref 0.450–4.500)

## 2020-11-22 LAB — SEDIMENTATION RATE: Sed Rate: 2 mm/hr (ref 0–30)

## 2020-11-22 LAB — HIV ANTIBODY (ROUTINE TESTING W REFLEX): HIV Screen 4th Generation wRfx: NONREACTIVE

## 2020-11-22 LAB — ANA W/REFLEX: ANA Titer 1: POSITIVE — AB

## 2020-11-22 LAB — VITAMIN B12: Vitamin B-12: 493 pg/mL (ref 232–1245)

## 2020-11-22 LAB — HEPATITIS C ANTIBODY: Hep C Virus Ab: <0.1 s/co ratio (ref 0.0–0.9)

## 2020-11-22 LAB — C-REACTIVE PROTEIN: CRP: 2 mg/L (ref 0–10)

## 2020-11-22 LAB — HEPATITIS B CORE ANTIBODY, TOTAL: Hep B Core Total Ab: NEGATIVE

## 2020-11-27 ENCOUNTER — Telehealth: Payer: Self-pay | Admitting: Internal Medicine

## 2020-11-27 NOTE — Telephone Encounter (Signed)
Attempted repatha re-authorization via CMM Message received: Available without authorization [employer]

## 2020-12-27 DIAGNOSIS — H25813 Combined forms of age-related cataract, bilateral: Secondary | ICD-10-CM | POA: Diagnosis not present

## 2020-12-30 NOTE — Progress Notes (Signed)
Office Visit Note  Patient: [name]             Date of Birth: 05/09/1951           MRN: [id number]             PCP: Asencion Noble, MD Referring: Penni Bombard, MD Visit Date: 12/31/2020   Subjective:  New Patient (Initial Visit) (Patient complains of bilateral foot pain and numbness. )   History of Present Illness: [name] is a 70 y.o. male with a history of HTN, statin associated myopathy, and CABG here for evaluation of progressive sensory neuropathy and positive ANA. His symptoms are insidious onset but more noticeable during about the past year. He has significantly reduced sensation of his bilateral feet worst in the distal foot. He was experiencing pain and paresthesias along with this but these improved with taking Lyrica for neuropathy. He denies any weakness, falls, or mobility limitation due to the current symptoms. He has not undergone any specific neurological testing but serology testing showed positive ANA 1:320 without specific Abs on reflex panel.   Labs reviewed 10/2020 ANA 1:320 ENA panel negative   Activities of Daily Living:  Patient reports morning stiffness for 0 minutes.   Patient Reports nocturnal pain.  Difficulty dressing/grooming: Denies Difficulty climbing stairs: Denies Difficulty getting out of chair: Denies Difficulty using hands for taps, buttons, cutlery, and/or writing: Reports  Review of Systems  Constitutional: Negative for fatigue.  HENT: Negative for mouth sores, mouth dryness and nose dryness.   Eyes: Positive for visual disturbance. Negative for pain, itching and dryness.  Respiratory: Negative for cough, hemoptysis, shortness of breath and difficulty breathing.   Cardiovascular: Negative for chest pain, palpitations and swelling in legs/feet.  Gastrointestinal: Negative for abdominal pain, blood in stool, constipation and diarrhea.  Endocrine: Negative for increased urination.  Genitourinary: Negative for painful  urination.  Musculoskeletal: Positive for arthralgias, joint pain, joint swelling and muscle tenderness. Negative for myalgias, muscle weakness, morning stiffness and myalgias.  Skin: Negative for color change, rash and redness.  Allergic/Immunologic: Negative for susceptible to infections.  Neurological: Positive for numbness. Negative for dizziness, headaches, memory loss and weakness.  Hematological: Negative for swollen glands.  Psychiatric/Behavioral: Negative for confusion and sleep disturbance.    PMFS History:  Patient Active Problem List   Diagnosis Date Noted   Peripheral neuropathy 12/31/2020   Positive ANA (antinuclear antibody) 12/31/2020   Statin myopathy 12/21/2019   History of colonic polyps 12/11/2016   S/P CABG x 2 07/03/2015   HTN (hypertension) 07/03/2015   Dyslipidemia 07/03/2015    Past Medical History:  Diagnosis Date   Coronary artery disease    a. s/p CABG in 2014 with LIMA-LAD and SVG-D1 - performed in [place]   Heart disease    Hyperlipidemia    Hypertension    Peripheral neuropathy     Family History  Problem Relation Age of Onset   Heart disease Father    Dementia Sister    Thyroid disease Sister    Hypertension Sister    Stroke Mother    Past Surgical History:  Procedure Laterality Date   COLONOSCOPY N/A 03/11/2017   Procedure: COLONOSCOPY;  Surgeon: Rogene Houston, MD;  [facility]: [facility];  Service: Endoscopy;  Laterality: N/A;  [place]   CORONARY ARTERY BYPASS GRAFT  2014   HERNIA REPAIR  2010   POLYPECTOMY  03/11/2017   Procedure: POLYPECTOMY;  Surgeon: Rogene Houston, MD;  Location: [facility];  Service: Endoscopy;;  colon   ROTATOR CUFF REPAIR Right 2014   vericous veins  2001   Social History   Social History Narrative   Lives with sig other   caffeine-  A little   Immunization History  Administered Date(s) Administered   Influenza,inj,Quad PF,6+ Mos 09/16/2018   Moderna Sars-Covid-2 Vaccination  01/27/2020, 02/25/2020, 09/06/2020     Objective: Vital Signs: BP 137/83 (BP Location: Right Arm, Patient Position: Sitting, Cuff Size: Normal)    Pulse 60    Ht 5\' 8"  (1.727 m)    Wt 191 lb 3.2 oz (86.7 kg)    BMI 29.07 kg/m    Physical Exam HENT:     Right Ear: External ear normal.     Left Ear: External ear normal.     Mouth/Throat:     Mouth: Mucous membranes are moist.     Pharynx: Oropharynx is clear.  Eyes:     Conjunctiva/sclera: Conjunctivae normal.  Cardiovascular:     Rate and Rhythm: Normal rate and regular rhythm.  Pulmonary:     Effort: Pulmonary effort is normal.     Breath sounds: Normal breath sounds.  Skin:    General: Skin is warm and dry.     Findings: No rash.  Neurological:     Mental Status: He is alert.     Comments: Decreased sensation in bilateral feet with normal capillary refill and normal motor strength Lower extremity DTRs are diminished but present in knee jerk and ankle jerk  Psychiatric:        Mood and Affect: Mood normal.     Musculoskeletal Exam:  Neck full range of motion no tenderness Shoulder, elbow, wrist, fingers full range of motion no tenderness or swelling Knees, ankles, MTPs full range of motion no tenderness or swelling   Investigation: No additional findings.  Imaging: No results found.  Recent Labs: Lab Results  Component Value Date   NA 139 07/03/2015   K 4.3 07/03/2015   CL 103 07/03/2015   CO2 26 07/03/2015   GLUCOSE 88 07/03/2015   BUN 21 07/03/2015   CREATININE 0.98 07/03/2015   PROT 7.3 11/06/2020   CALCIUM 9.6 07/03/2015    Speciality Comments: No specialty comments available.  Procedures:  No procedures performed Allergies: Gabapentin   Assessment / Plan:     Visit Diagnoses: Peripheral polyneuropathy Positive ANA - Plan: Pan-ANCA, Sedimentation rate  Gradual onset peripheral sensory neuropathy without motor involvement based on history and exam thus far. Diminished reflexes but strength feels  completely normal. Will check vasculitis Abs which could fit presentation although less likely. Review of systems and history is unremarkable for ANA related systemic connective tissue disease at this time.  Orders: Orders Placed This Encounter  Procedures   Pan-ANCA   Sedimentation rate   No orders of the defined types were placed in this encounter.   Follow-Up Instructions: No follow-ups on file.   Collier Salina, MD  Note - This record has been created using [employer].  Chart creation errors have been sought, but may not always  have been located. Such creation errors do not reflect on  the standard of medical care.

## 2020-12-31 ENCOUNTER — Encounter: Payer: Self-pay | Admitting: Internal Medicine

## 2020-12-31 ENCOUNTER — Other Ambulatory Visit: Payer: Self-pay

## 2020-12-31 ENCOUNTER — Ambulatory Visit: Payer: PPO | Admitting: Internal Medicine

## 2020-12-31 VITALS — BP 137/83 | HR 60 | Ht 68.0 in | Wt 191.2 lb

## 2020-12-31 DIAGNOSIS — G629 Polyneuropathy, unspecified: Secondary | ICD-10-CM

## 2020-12-31 DIAGNOSIS — R768 Other specified abnormal immunological findings in serum: Secondary | ICD-10-CM | POA: Diagnosis not present

## 2020-12-31 NOTE — Patient Instructions (Signed)
I do not see clear evidence of inflammatory nerve disease at this time. Your positive blood tests and change in sensation and reflexes can be seen with this. I will look for evidence of vasculitis and systemic inflammation today and we will contact you after results. If normal I do have much suspicion for rheumatic disease causing the symptoms.  Erythrocyte Sedimentation Rate Test Why am I having this test? The erythrocyte sedimentation rate (ESR) test is used to help find illnesses related to:  Sudden (acute) or long-term (chronic) infections.  Inflammation.  The body's disease-fighting system attacking healthy cells (autoimmune diseases).  Cancer.  Tissue death. If you have symptoms that may be related to any of these illnesses, your health care provider may do an ESR test before doing more specific tests. If you have an inflammatory immune disease, such as rheumatoid arthritis, you may have this test to help monitor your therapy. What is being tested? This test measures how long it takes for your red blood cells (erythrocytes) to settle in a solution over a certain amount of time (sedimentation rate). When you have an infection or inflammation, your red blood cells clump together and settle faster. The sedimentation rate provides information about how much inflammation is present in the body. What kind of sample is taken? A blood sample is required for this test. It is usually collected by inserting a needle into a blood vessel.   How do I prepare for this test? Follow any instructions from your health care provider about changing or stopping your regular medicines. Tell a health care provider about:  Any allergies you have.  All medicines you are taking, including vitamins, herbs, eye drops, creams, and over-the-counter medicines.  Any blood disorders you have.  Any surgeries you have had.  Any medical conditions you have, such as thyroid or kidney disease.  Whether you are  pregnant or may be pregnant. How are the results reported? Your results will be reported as a value that measures sedimentation rate in millimeters per hour (mm/hr). Your health care provider will compare your results to normal ranges that were established after testing a large group of people (reference values). Reference values may vary among labs and hospitals. For this test, common reference values, which vary by age and gender, are:  Newborn: 0-2 mm/hr.  Child, up to puberty: 0-10 mm/hr.  Male: ? Under 50 years: 0-20 mm/hr. ? 50-85 years: 0-30 mm/hr. ? Over 85 years: 0-42 mm/hr.  Male: ? Under 50 years: 0-15 mm/hr. ? 50-85 years: 0-20 mm/hr. ? Over 85 years: 0-30 mm/hr. Certain conditions or medicines may cause ESR levels to be falsely lower or higher, such as:  Pregnancy.  Obesity.  Steroids, birth control pills, and blood thinners.  Thyroid or kidney disease. What do the results mean? Results that are within reference values are considered normal, meaning that the level of inflammation in your body is healthy. High ESR levels mean that there is inflammation in your body. You will have more tests to help make a diagnosis. Inflammation may result from many different conditions or injuries. Talk with your health care provider about what your results mean. Questions to ask your health care provider Ask your health care provider, or the department that is doing the test:  When will my results be ready?  How will I get my results?  What are my treatment options?  What other tests do I need?  What are my next steps? Summary  The erythrocyte sedimentation rate (ESR)  test is used to help find illnesses associated with sudden (acute) or long-term (chronic) infections, inflammation, autoimmune diseases, cancer, or tissue death.  If you have symptoms that may be related to any of these illnesses, your health care provider may do an ESR test before doing more specific tests.  If you have an inflammatory immune disease, such as rheumatoid arthritis, you may have this test to help monitor your therapy.  This test measures how long it takes for your red blood cells (erythrocytes) to settle in a solution over a certain amount of time (sedimentation rate). This provides information about how much inflammation is present in the body. This information is not intended to replace advice given to you by your health care provider. Make sure you discuss any questions you have with your health care provider. Document Revised: 08/10/2020 Document Reviewed: 08/10/2020 Elsevier Patient Education  [place].

## 2021-01-01 LAB — PAN-ANCA
ANCA Screen: NEGATIVE
Myeloperoxidase Abs: <1 AI
Serine Protease 3: <1 AI

## 2021-01-01 LAB — SEDIMENTATION RATE: Sed Rate: 2 mm/h (ref 0–20)

## 2021-01-02 NOTE — Progress Notes (Signed)
Lab tests for markers of inflammation are negative and antibody tests for inflammation of blood vessels was also negative. Overall I do not think an active autoimmune process is likely to be causing the symptoms so no specific rheumatology follow up needed. I recommend following up with neurology. Can discuss with them whether nerve conduction testing would be useful for deciding about prognosis or treatment.

## 2021-01-13 ENCOUNTER — Other Ambulatory Visit: Payer: Self-pay

## 2021-01-13 ENCOUNTER — Encounter (HOSPITAL_COMMUNITY): Payer: Self-pay | Admitting: Emergency Medicine

## 2021-01-13 ENCOUNTER — Emergency Department (HOSPITAL_COMMUNITY): Payer: PPO

## 2021-01-13 ENCOUNTER — Emergency Department (HOSPITAL_COMMUNITY)
Admission: EM | Admit: 2021-01-13 | Discharge: 2021-01-13 | Disposition: A | Discharge status: Home / Self Care | Payer: PPO | Attending: Emergency Medicine | Admitting: Emergency Medicine

## 2021-01-13 DIAGNOSIS — Z9889 Other specified postprocedural states: Secondary | ICD-10-CM | POA: Diagnosis not present

## 2021-01-13 DIAGNOSIS — Y92008 Other place in unspecified non-institutional (private) residence as the place of occurrence of the external cause: Secondary | ICD-10-CM | POA: Diagnosis not present

## 2021-01-13 DIAGNOSIS — Z951 Presence of aortocoronary bypass graft: Secondary | ICD-10-CM | POA: Diagnosis not present

## 2021-01-13 DIAGNOSIS — Z7982 Long term (current) use of aspirin: Secondary | ICD-10-CM | POA: Diagnosis not present

## 2021-01-13 DIAGNOSIS — W010XXA Fall on same level from slipping, tripping and stumbling without subsequent striking against object, initial encounter: Secondary | ICD-10-CM | POA: Insufficient documentation

## 2021-01-13 DIAGNOSIS — I10 Essential (primary) hypertension: Secondary | ICD-10-CM | POA: Diagnosis not present

## 2021-01-13 DIAGNOSIS — S2241XA Multiple fractures of ribs, right side, initial encounter for closed fracture: Secondary | ICD-10-CM

## 2021-01-13 DIAGNOSIS — I2510 Atherosclerotic heart disease of native coronary artery without angina pectoris: Secondary | ICD-10-CM | POA: Insufficient documentation

## 2021-01-13 DIAGNOSIS — S299XXA Unspecified injury of thorax, initial encounter: Secondary | ICD-10-CM | POA: Diagnosis not present

## 2021-01-13 DIAGNOSIS — W19XXXA Unspecified fall, initial encounter: Secondary | ICD-10-CM

## 2021-01-13 DIAGNOSIS — Z79899 Other long term (current) drug therapy: Secondary | ICD-10-CM | POA: Insufficient documentation

## 2021-01-13 DIAGNOSIS — I700 Atherosclerosis of aorta: Secondary | ICD-10-CM | POA: Diagnosis not present

## 2021-01-13 MED ORDER — HYDROCODONE-ACETAMINOPHEN 5-325 MG PO TABS
2.0000 | ORAL_TABLET | Freq: Once | ORAL | Status: AC
  Administered 2021-01-13: 2 via ORAL
  Filled 2021-01-13: qty 2

## 2021-01-13 MED ORDER — HYDROCODONE-ACETAMINOPHEN 5-325 MG PO TABS: 1.0000 | ORAL_TABLET | Freq: Four times a day (QID) | ORAL | 0 refills | Status: DC | PRN

## 2021-01-13 MED ORDER — HYDROCODONE-ACETAMINOPHEN 5-325 MG PO TABS: 2.0000 | ORAL_TABLET | ORAL | 0 refills | Status: DC | PRN

## 2021-01-13 NOTE — ED Triage Notes (Signed)
Pt s/p fall last night around 9pm. Pt here with c/o R sided rib pain. States hit R side on a table when he fell.

## 2021-01-13 NOTE — Discharge Instructions (Addendum)
Begin taking hydrocodone as prescribed as needed for pain.  Return to the emergency department if you develop worsening pain, difficulty breathing, high fever, or other new and concerning symptoms. 

## 2021-01-13 NOTE — ED Provider Notes (Signed)
[facility] EMERGENCY DEPARTMENT Provider Note   CSN: [id number] Arrival date & time: 01/13/21  [id number]     History Chief Complaint  Patient presents with  . Fall    [name] is a 70 y.o. male.  Patient is a 70 year old male with history of coronary artery disease status post CABG in 2014, hypertension, hyperlipidemia.  He presents today for evaluation of fall.  Patient states that he tripped on an uneven living room floor at a friend's house and landed on a table injuring his right ribs.  He describes pain to the rib cage when he breathes or moves.  He denies shortness of breath.  Pain is improved somewhat with remaining still.  The history is provided by the patient.  Fall This is a new problem. The current episode started 1 to 2 hours ago. The problem occurs constantly. The problem has not changed since onset.Pertinent negatives include no shortness of breath. Nothing relieves the symptoms. He has tried nothing for the symptoms.       Past Medical History:  Diagnosis Date  . Coronary artery disease    a. s/p CABG in 2014 with LIMA-LAD and SVG-D1 - performed in [place]  . Heart disease   . Hyperlipidemia   . Hypertension   . Peripheral neuropathy     Patient Active Problem List   Diagnosis Date Noted  . Peripheral neuropathy 12/31/2020  . Positive ANA (antinuclear antibody) 12/31/2020  . Statin myopathy 12/21/2019  . History of colonic polyps 12/11/2016  . S/P CABG x 2 07/03/2015  . HTN (hypertension) 07/03/2015  . Dyslipidemia 07/03/2015    Past Surgical History:  Procedure Laterality Date  . COLONOSCOPY N/A 03/11/2017   Procedure: COLONOSCOPY;  Surgeon: Rogene Houston, MD;  Location: [facility];  Service: Endoscopy;  Laterality: N/A;  [place]  . CORONARY ARTERY BYPASS GRAFT  2014  . HERNIA REPAIR  2010  . POLYPECTOMY  03/11/2017   Procedure: POLYPECTOMY;  Surgeon: Rogene Houston, MD;  Location: [facility];  Service: Endoscopy;;  colon  . ROTATOR CUFF REPAIR  Right 2014  . vericous veins  2001       Family History  Problem Relation Age of Onset  . Heart disease Father   . Dementia Sister   . Thyroid disease Sister   . Hypertension Sister   . Stroke Mother     Social History   Tobacco Use  . Smoking status: Never Smoker  . Smokeless tobacco: Never Used  Vaping Use  . Vaping Use: Never used  Substance Use Topics  . Alcohol use: Yes    Alcohol/week: 0.0 standard drinks    Comment: [place] on the weekends x 4  . Drug use: No    Home Medications Prior to Admission medications   Medication Sig Start Date End Date Taking? Authorizing Provider  acetaminophen (TYLENOL) 325 MG tablet Take 650 mg by mouth daily as needed for moderate pain or headache.    [provider]  aspirin 81 MG tablet Take 1 tablet (81 mg total) by mouth daily. 03/12/17   [name], Mechele Dawley, MD  irbesartan (AVAPRO) 75 MG tablet Take 75 mg by mouth daily.    [provider]  metoprolol tartrate (LOPRESSOR) 25 MG tablet Take 12.5 mg by mouth every morning.    [provider]  Multiple Vitamins-Minerals (ZINC PO) Take 1 capsule by mouth 3 (three) times a week.    [provider]  pregabalin (LYRICA) 50 MG capsule Take  1 capsule (50 mg total) by mouth 2 (two) times daily. 11/05/20   Penumalli, Earlean Polka, MD  REPATHA SURECLICK 102 MG/ML SOAJ INJECT 1 DOSE INTO THE SKIN EVERY 14 DAYS. 11/05/20   Hilty, Nadean Corwin, MD  sildenafil (REVATIO) 20 MG tablet Take 40-100 mg by mouth daily as needed (erectile dysfunction).    [provider]    Allergies    Gabapentin  Review of Systems   Review of Systems  Respiratory: Negative for shortness of breath.   All other systems reviewed and are negative.   Physical Exam Updated Vital Signs BP (!) 148/81 (BP Location: Right Arm)   Pulse 78   Temp 98.7 F (37.1 C)   Resp 18   Ht 5\' 8"  (1.727 m)   Wt 87 kg   SpO2 97%   BMI 29.16 kg/m   Physical Exam Vitals and nursing note  reviewed.  Constitutional:      General: He is not in acute distress.    Appearance: He is well-developed and well-nourished. He is not diaphoretic.  HENT:     Head: Normocephalic and atraumatic.     Mouth/Throat:     Mouth: Oropharynx is clear and moist.  Cardiovascular:     Rate and Rhythm: Normal rate and regular rhythm.     Heart sounds: No murmur heard. No friction rub.  Pulmonary:     Effort: Pulmonary effort is normal. No respiratory distress.     Breath sounds: Normal breath sounds. No wheezing or rales.     Comments: There is tenderness to palpation overlying the right lateral rib cage.  There is no crepitus and breath sounds are equal. Abdominal:     General: Bowel sounds are normal. There is no distension.     Palpations: Abdomen is soft.     Tenderness: There is no abdominal tenderness.  Musculoskeletal:        General: No edema. Normal range of motion.     Cervical back: Normal range of motion and neck supple.  Skin:    General: Skin is warm and dry.  Neurological:     Mental Status: He is alert and oriented to person, place, and time.     Coordination: Coordination normal.     ED Results / Procedures / Treatments   Labs (all labs ordered are listed, but only abnormal results are displayed) Labs Reviewed - No data to display  EKG None  Radiology DG Ribs Unilateral W/Chest Right  Result Date: 01/13/2021 CLINICAL DATA:  Right-sided rib pain after a fall last night. EXAM: RIGHT RIBS AND CHEST - 3+ VIEW COMPARISON:  None. FINDINGS: Postoperative changes in the mediastinum. Heart size and pulmonary vascularity are normal. Slight fibrosis or linear atelectasis in the lung bases. No airspace disease or consolidation. No pleural effusions. No pneumothorax. Calcification of the aorta. Acute mildly displaced fractures demonstrated in the right anterior eighth and ninth ribs. No focal bone lesion or bone destruction. IMPRESSION: Acute mildly displaced fractures of the  right anterior eighth and ninth ribs. Electronically Signed   By: Lucienne Capers M.D.   On: 01/13/2021 02:28    Procedures Procedures (including critical care time)  Medications Ordered in ED Medications  HYDROcodone-acetaminophen (NORCO/VICODIN) 5-325 MG per tablet 2 tablet (has no administration in time range)    ED Course  I have reviewed the triage vital signs and the nursing notes.  Pertinent labs & imaging results that were available during my care of the patient were reviewed by me  and considered in my medical decision making (see chart for details).    MDM Rules/Calculators/A&P  X-ray shows mildly displaced fractures of the eighth and ninth ribs, but no evidence for pneumothorax.  Patient's oxygen saturations are adequate.  He will be discharged with pain medicine and as needed return.  Final Clinical Impression(s) / ED Diagnoses Final diagnoses:  None    Rx / DC Orders ED Discharge Orders    None       Veryl Speak, MD 01/13/21 [phone]

## 2021-01-14 MED FILL — Hydrocodone-Acetaminophen Tab 5-325 MG: ORAL | Qty: 6 | Status: AC

## 2021-01-21 DIAGNOSIS — H11153 Pinguecula, bilateral: Secondary | ICD-10-CM | POA: Diagnosis not present

## 2021-01-21 DIAGNOSIS — H25813 Combined forms of age-related cataract, bilateral: Secondary | ICD-10-CM | POA: Diagnosis not present

## 2021-01-21 DIAGNOSIS — H01001 Unspecified blepharitis right upper eyelid: Secondary | ICD-10-CM | POA: Diagnosis not present

## 2021-01-21 DIAGNOSIS — H01002 Unspecified blepharitis right lower eyelid: Secondary | ICD-10-CM | POA: Diagnosis not present

## 2021-02-07 ENCOUNTER — Encounter (HOSPITAL_COMMUNITY): Payer: Self-pay

## 2021-02-07 ENCOUNTER — Other Ambulatory Visit: Payer: Self-pay

## 2021-02-07 ENCOUNTER — Encounter (HOSPITAL_COMMUNITY)
Admission: RE | Admit: 2021-02-07 | Discharge: 2021-02-07 | Disposition: A | Discharge status: Home / Self Care | Payer: PPO | Source: Ambulatory Visit | Attending: Ophthalmology | Admitting: Ophthalmology

## 2021-02-07 HISTORY — DX: Multiple fractures of ribs, unspecified side, initial encounter for closed fracture: S22.49XA

## 2021-02-07 NOTE — H&P (Signed)
Surgical History & Physical  Patient Name: [name] DOB: 1951/11/22  Surgery: Cataract extraction with intraocular lens implant phacoemulsification; Right Eye  Surgeon: Walter Goldmann MD Surgery Date:  02/15/2021 Pre-Op Date:  01/21/2021  HPI: [name] is referred by Dr [name] for cataract eval. 1. The patient complains of difficulty when seeing street signs, which began many years ago. Both eyes are affected. The episode is gradual. The condition's severity increased since last visit. Symptoms occur when the patient is driving, inside and outside. and when playing golf. This is negatively affecting his quality of life. HPI HPI Completed by Dr. Baruch Bell  Medical History: Cataracts High Blood Pressure  Review of Systems Negative Allergic/Immunologic Negative Cardiovascular Negative Constitutional Negative Ear, Nose, Mouth & Throat Negative Endocrine Negative Eyes Negative Gastrointestinal Negative Genitourinary Negative Hemotologic/Lymphatic Negative Integumentary Negative Musculoskeletal Negative Neurological Negative Psychiatry Negative Respiratory  Social   Never smoked   Medication Repatha, Pregabalin, Aspirin, Lbesartan, Metoprolol,   Sx/Procedures Bypass Surgery, Rotator Cuff,   Drug Allergies   NKDA  History & Physical: Heent:  Cataract, Right eye NECK: supple without bruits LUNGS: lungs clear to auscultation CV: regular rate and rhythm Abdomen: soft and non-tender  Impression & Plan: Assessment: 1.  COMBINED FORMS AGE RELATED CATARACT; Both Eyes (H25.813) 2.  ASTIGMATISM, REGULAR; Both Eyes (H52.223) 3.  BLEPHARITIS; Right Upper Lid, Right Lower Lid, Left Upper Lid, Left Lower Lid (H01.001, H01.002,H01.004,H01.005) 4.  CHALAZION; Right Lower Lid (H00.12) 5.  Pinguecula; Both Eyes (H11.153) 6.  DERMATOCHALASIS; Right Upper Lid, Left Upper Lid (H02.831, J88.416)  Plan: 1.  Cataract accounts for the patient's decreased vision.  This visual impairment is not correctable with a tolerable change in glasses or contact lenses. Cataract surgery with an implantation of a new lens should significantly improve the visual and functional status of the patient. Discussed all risks, benefits, alternatives, and potential complications. Discussed the procedures and recovery. Patient desires to have surgery. A-scan ordered and performed today for intra-ocular lens calculations. The surgery will be performed in order to improve vision for driving, reading, and for eye examinations. Recommend phacoemulsification with intra-ocular lens. Recommend Dextenza for post-operative pain and inflammation. Right Eye. Dilates poorly - shugacaine by protocol. Malyugin Ring. Omidira. Consider Vivity Lens (Vivity Toric OS) 2.  VIvity toric OS. 3.  recommend regular lid cleaning. 4.  Symptomatic and very bothersome to patient. Not resolved after conservative therapy including frequent hot compresses. Recommend procedure. 5.  Observe; Artificial tears as needed for irritation. 6.  Asymptomatic, recommend observation for now. Findings, prognosis and treatment options reviewed.

## 2021-02-11 DIAGNOSIS — H25811 Combined forms of age-related cataract, right eye: Secondary | ICD-10-CM | POA: Diagnosis not present

## 2021-02-13 ENCOUNTER — Other Ambulatory Visit (HOSPITAL_COMMUNITY)
Admission: RE | Admit: 2021-02-13 | Discharge: 2021-02-13 | Disposition: A | Discharge status: Home / Self Care | Payer: PPO | Source: Ambulatory Visit | Attending: Ophthalmology | Admitting: Ophthalmology

## 2021-02-13 ENCOUNTER — Other Ambulatory Visit: Payer: Self-pay

## 2021-02-13 DIAGNOSIS — Z20822 Contact with and (suspected) exposure to covid-19: Secondary | ICD-10-CM | POA: Diagnosis not present

## 2021-02-13 DIAGNOSIS — Z01812 Encounter for preprocedural laboratory examination: Secondary | ICD-10-CM | POA: Diagnosis not present

## 2021-02-13 LAB — SARS CORONAVIRUS 2 (TAT 6-24 HRS): SARS Coronavirus 2: NEGATIVE

## 2021-02-15 ENCOUNTER — Ambulatory Visit (HOSPITAL_COMMUNITY)
Admission: RE | Admit: 2021-02-15 | Discharge: 2021-02-15 | Disposition: A | Discharge status: Home / Self Care | Payer: PPO | Attending: Ophthalmology | Admitting: Ophthalmology

## 2021-02-15 ENCOUNTER — Encounter (HOSPITAL_COMMUNITY): Payer: Self-pay | Admitting: Ophthalmology

## 2021-02-15 ENCOUNTER — Encounter (HOSPITAL_COMMUNITY)
Admission: RE | Disposition: A | Discharge status: Home / Self Care | Payer: Self-pay | Source: Home / Self Care | Attending: Ophthalmology

## 2021-02-15 ENCOUNTER — Ambulatory Visit (HOSPITAL_COMMUNITY): Payer: PPO | Admitting: Anesthesiology

## 2021-02-15 DIAGNOSIS — H25811 Combined forms of age-related cataract, right eye: Secondary | ICD-10-CM | POA: Insufficient documentation

## 2021-02-15 DIAGNOSIS — I2510 Atherosclerotic heart disease of native coronary artery without angina pectoris: Secondary | ICD-10-CM | POA: Diagnosis not present

## 2021-02-15 HISTORY — PX: CATARACT EXTRACTION W/PHACO: SHX586

## 2021-02-15 SURGERY — PHACOEMULSIFICATION, CATARACT, WITH IOL INSERTION
Anesthesia: Monitor Anesthesia Care | Site: Eye | Laterality: Right

## 2021-02-15 MED ORDER — POVIDONE-IODINE 5 % OP SOLN
OPHTHALMIC | Status: DC | PRN
  Administered 2021-02-15: 1 via OPHTHALMIC

## 2021-02-15 MED ORDER — NEOMYCIN-POLYMYXIN-DEXAMETH 3.5-10000-0.1 OP SUSP
OPHTHALMIC | Status: DC | PRN
  Administered 2021-02-15: 1 [drp] via OPHTHALMIC

## 2021-02-15 MED ORDER — LIDOCAINE HCL (PF) 1 % IJ SOLN
INTRAOCULAR | Status: DC | PRN
  Administered 2021-02-15: 1 mL via OPHTHALMIC

## 2021-02-15 MED ORDER — LACTATED RINGERS IV SOLN: INTRAVENOUS | Status: DC

## 2021-02-15 MED ORDER — EPINEPHRINE PF 1 MG/ML IJ SOLN
INTRAMUSCULAR | Status: AC
  Filled 2021-02-15: qty 1

## 2021-02-15 MED ORDER — PHENYLEPHRINE-KETOROLAC 1-0.3 % IO SOLN
INTRAOCULAR | Status: DC | PRN
  Administered 2021-02-15: 500 mL via OPHTHALMIC

## 2021-02-15 MED ORDER — TROPICAMIDE 1 % OP SOLN
1.0000 [drp] | OPHTHALMIC | Status: AC
  Administered 2021-02-15 (×3): 1 [drp] via OPHTHALMIC

## 2021-02-15 MED ORDER — PROVISC 10 MG/ML IO SOLN
INTRAOCULAR | Status: DC | PRN
  Administered 2021-02-15: 0.85 mL via INTRAOCULAR

## 2021-02-15 MED ORDER — LIDOCAINE HCL 3.5 % OP GEL
1.0000 "application " | Freq: Once | OPHTHALMIC | Status: AC
  Administered 2021-02-15: 1 via OPHTHALMIC

## 2021-02-15 MED ORDER — STERILE WATER FOR IRRIGATION IR SOLN
Status: DC | PRN
  Administered 2021-02-15: 250 mL

## 2021-02-15 MED ORDER — PHENYLEPHRINE HCL 2.5 % OP SOLN
1.0000 [drp] | OPHTHALMIC | Status: AC | PRN
  Administered 2021-02-15 (×3): 1 [drp] via OPHTHALMIC

## 2021-02-15 MED ORDER — PHENYLEPHRINE-KETOROLAC 1-0.3 % IO SOLN
INTRAOCULAR | Status: AC
  Filled 2021-02-15: qty 4

## 2021-02-15 MED ORDER — TETRACAINE HCL 0.5 % OP SOLN
1.0000 [drp] | OPHTHALMIC | Status: AC | PRN
  Administered 2021-02-15 (×3): 1 [drp] via OPHTHALMIC

## 2021-02-15 MED ORDER — BSS IO SOLN
INTRAOCULAR | Status: DC | PRN
  Administered 2021-02-15: 15 mL via INTRAOCULAR

## 2021-02-15 MED ORDER — SODIUM HYALURONATE 23 MG/ML IO SOLN
INTRAOCULAR | Status: DC | PRN
  Administered 2021-02-15: 0.6 mL via INTRAOCULAR

## 2021-02-15 SURGICAL SUPPLY — 16 items
CLOTH BEACON ORANGE TIMEOUT ST (SAFETY) ×2 IMPLANT
DEVICE MILOOP (MISCELLANEOUS) IMPLANT
EYE SHIELD UNIVERSAL CLEAR (GAUZE/BANDAGES/DRESSINGS) ×2 IMPLANT
GLOVE SURG UNDER POLY LF SZ6.5 (GLOVE) ×2 IMPLANT
GLOVE SURG UNDER POLY LF SZ7 (GLOVE) ×2 IMPLANT
MILOOP DEVICE (MISCELLANEOUS)
NEEDLE HYPO 18GX1.5 BLUNT FILL (NEEDLE) ×2 IMPLANT
PAD ARMBOARD 7.5X6 YLW CONV (MISCELLANEOUS) ×2 IMPLANT
RING MALYGIN (MISCELLANEOUS) IMPLANT
RING MALYGIN 7.0 (MISCELLANEOUS) IMPLANT
SYR TB 1ML LL NO SAFETY (SYRINGE) ×2 IMPLANT
TAPE SURG TRANSPORE 1 IN (GAUZE/BANDAGES/DRESSINGS) ×1 IMPLANT
TAPE SURGICAL TRANSPORE 1 IN (GAUZE/BANDAGES/DRESSINGS) ×2
Tecnis 1-Piece Intraocular Lens (Intraocular Lens) ×2 IMPLANT
VISCOELASTIC ADDITIONAL (OPHTHALMIC RELATED) IMPLANT
WATER STERILE IRR 250ML POUR (IV SOLUTION) ×2 IMPLANT

## 2021-02-15 NOTE — Anesthesia Preprocedure Evaluation (Signed)
Anesthesia Evaluation  Patient identified by MRN, date of birth, ID band Patient awake    Reviewed: Allergy & Precautions, H&P , NPO status , Patient's Chart, lab work & pertinent test results, reviewed documented beta blocker date and time   Airway Mallampati: II  TM Distance: >3 FB Neck ROM: full    Dental no notable dental hx.    Pulmonary neg pulmonary ROS,    Pulmonary exam normal breath sounds clear to auscultation       Cardiovascular Exercise Tolerance: Good hypertension, + CAD and + CABG   Rhythm:regular Rate:Normal     Neuro/Psych negative neurological ROS  negative psych ROS   GI/Hepatic negative GI ROS, Neg liver ROS,   Endo/Other  negative endocrine ROS  Renal/GU negative Renal ROS  negative genitourinary   Musculoskeletal   Abdominal   Peds  Hematology negative hematology ROS (+)   Anesthesia Other Findings   Reproductive/Obstetrics negative OB ROS                             Anesthesia Physical Anesthesia Plan  ASA: III  Anesthesia Plan: MAC   Post-op Pain Management:    Induction:   PONV Risk Score and Plan:   Airway Management Planned:   Additional Equipment:   Intra-op Plan:   Post-operative Plan:   Informed Consent: I have reviewed the patients History and Physical, chart, labs and discussed the procedure including the risks, benefits and alternatives for the proposed anesthesia with the patient or authorized representative who has indicated his/her understanding and acceptance.     Dental Advisory Given  Plan Discussed with: CRNA  Anesthesia Plan Comments:         Anesthesia Quick Evaluation

## 2021-02-15 NOTE — Anesthesia Postprocedure Evaluation (Signed)
Anesthesia Post Note  Patient: Antwyne Pingree  Procedure(s) Performed: CATARACT EXTRACTION PHACO AND INTRAOCULAR LENS PLACEMENT (IOC) (Right Eye)  Patient location during evaluation: Short Stay Anesthesia Type: MAC Level of consciousness: awake and alert and oriented Pain management: pain level controlled Vital Signs Assessment: post-procedure vital signs reviewed and stable Respiratory status: spontaneous breathing Cardiovascular status: blood pressure returned to baseline and stable Postop Assessment: no apparent nausea or vomiting Anesthetic complications: no   No complications documented.   Last Vitals:  Vitals:   02/15/21 0727 02/15/21 0857  BP: (!) 144/82 (!) 147/79  Pulse: (!) 58 (!) 58  Resp: 12   Temp: 36.6 C 36.7 C  SpO2: 99% 98%    Last Pain:  Vitals:   02/15/21 0857  TempSrc: Oral  PainSc: 0-No pain                 Manson Luckadoo

## 2021-02-15 NOTE — Interval H&P Note (Signed)
History and Physical Interval Note:  02/15/2021 8:27 AM  [name]  has presented today for surgery, with the diagnosis of Nuclear sclerotic cataract - Right eye.  The various methods of treatment have been discussed with the patient and family. After consideration of risks, benefits and other options for treatment, the patient has consented to  Procedure(s) with comments: CATARACT EXTRACTION PHACO AND INTRAOCULAR LENS PLACEMENT (IOC) (Right) - right, pt knows to arrive at 6:30 as a surgical intervention.  The patient's history has been reviewed, patient examined, no change in status, stable for surgery.  I have reviewed the patient's chart and labs.  Questions were answered to the patient's satisfaction.     Baruch Goldmann

## 2021-02-15 NOTE — Transfer of Care (Signed)
Immediate Anesthesia Transfer of Care Note  Patient: [name]  Procedure(s) Performed: CATARACT EXTRACTION PHACO AND INTRAOCULAR LENS PLACEMENT ([facility]) (Right Eye)  Patient Location: Short Stay  Anesthesia Type:MAC  Level of Consciousness: awake  Airway & Oxygen Therapy: Patient Spontanous Breathing  Post-op Assessment: Report given to RN  Post vital signs: Reviewed and stable  Last Vitals:  Vitals Value Taken Time  BP    Temp    Pulse    Resp    SpO2      Last Pain:  Vitals:   02/15/21 0727  TempSrc: Oral  PainSc: 0-No pain      Patients Stated Pain Goal: 5 (70/48/88 9169)  Complications: No complications documented.

## 2021-02-15 NOTE — Op Note (Signed)
Date of procedure: 02/15/21  Pre-operative diagnosis:  Visually significant combined form age-related cataract, Right Eye (H25.811)  Post-operative diagnosis:  Visually significant combined form age-related cataract, Right Eye (H25.811)  Procedure: Removal of cataract via phacoemulsification and insertion of intra-ocular lens Wynetta Emery and Griffin  +19.5D into the capsular bag of the Right Eye  Attending surgeon: Gerda Diss. Loral Campi, MD, MA  Anesthesia: MAC, Topical Akten  Complications: None  Estimated Blood Loss: <54m (minimal)  Specimens: None  Implants: As above  Indications:  Visually significant age-related cataract, Right Eye  Procedure:  The patient was seen and identified in the pre-operative area. The operative eye was identified and dilated.  The operative eye was marked.  Topical anesthesia was administered to the operative eye.     The patient was then to the operative suite and placed in the supine position.  A timeout was performed confirming the patient, procedure to be performed, and all other relevant information.   The patient's face was prepped and draped in the usual fashion for intra-ocular surgery.  A lid speculum was placed into the operative eye and the surgical microscope moved into place and focused.  A superotemporal paracentesis was created using a 20 gauge paracentesis blade.  Shugarcaine was injected into the anterior chamber.  Viscoelastic was injected into the anterior chamber.  A temporal clear-corneal main wound incision was created using a 2.420mmicrokeratome.  A continuous curvilinear capsulorrhexis was initiated using an irrigating cystitome and completed using capsulorrhexis forceps.  Hydrodissection and hydrodeliniation were performed.  Viscoelastic was injected into the anterior chamber.  A phacoemulsification handpiece and a chopper as a second instrument were used to remove the nucleus and epinucleus. The irrigation/aspiration handpiece was  used to remove any remaining cortical material.   The capsular bag was reinflated with viscoelastic, checked, and found to be intact.  The intraocular lens was inserted into the capsular bag.  The irrigation/aspiration handpiece was used to remove any remaining viscoelastic.  The clear corneal wound and paracentesis wounds were then hydrated and checked with Weck-Cels to be watertight.  The lid-speculum was removed.  The drape was removed.  The patient's face was cleaned with a wet and dry 4x4.   Maxitrol was instilled in the eye. A clear shield was taped over the eye. The patient was taken to the post-operative care unit in good condition, having tolerated the procedure well.  Post-Op Instructions: [facility] and will receive all other orders and instructions.

## 2021-02-15 NOTE — Discharge Instructions (Signed)
Please discharge patient when stable, will follow up today with Dr. Tashana Haberl at the [facility] [place] office immediately following discharge.  Leave shield in place until visit.  All paperwork with discharge instructions will be given at the office.   [facility] Address:  [address]  [place], [place] [address]  

## 2021-02-18 ENCOUNTER — Encounter (HOSPITAL_COMMUNITY): Payer: Self-pay | Admitting: Ophthalmology

## 2021-02-25 DIAGNOSIS — H25812 Combined forms of age-related cataract, left eye: Secondary | ICD-10-CM | POA: Diagnosis not present

## 2021-02-26 NOTE — Patient Instructions (Signed)
    [name]  02/26/2021     @PREFPERIOPPHARMACY @   Your procedure is scheduled on  03/04/2021   Report to [facility] at  [place].M.   Call this number if you have problems the morning of surgery:  [phone]   Remember:  Do not eat or drink after midnight.                       Take these medicines the morning of surgery with A SIP OF WATER  Metoprolol, lyrica.     Please brush your teeth.  Do not wear jewelry, make-up or nail polish.  Do not wear lotions, powders, or perfumes, or deodorant.  Do not shave 48 hours prior to surgery.  Men may shave face and neck.  Do not bring valuables to the hospital.  [facility] is not responsible for any belongings or valuables.  Contacts, dentures or bridgework may not be worn into surgery.  Leave your suitcase in the car.  After surgery it may be brought to your room.  For patients admitted to the hospital, discharge time will be determined by your treatment team.  Patients discharged the day of surgery will not be allowed to drive home and must have someone with them for 24 hours.   Special instructions:  DO NOT smoke tobacco or vqpe the morning of your procedure.  Please read over the following fact sheets that you were given. Anesthesia Post-op Instructions and Care and Recovery After Surgery

## 2021-02-26 NOTE — H&P (Signed)
Surgical History & Physical  Patient Name: Rashaud Ybarbo DOB: 1951-12-10  Surgery: Cataract extraction with intraocular lens implant phacoemulsification; Left Eye  Surgeon: Baruch Goldmann MD Surgery Date:  03/04/2021 Pre-Op Date:  02/21/2021  HPI: A 11 Yr. old male patient 1. The patient complains of difficulty when seeing street signs, which began many years ago. The left eye is affected. The episode is gradual. The condition's severity increased since last visit. Symptoms occur when the patient is driving, inside and outside. 2. The patient is returning after cataract post-op. The right eye is affected. Status post cataract post-op, which began 1 week ago: Since the last visit, the affected area feels improvement. The patient's vision is improved. Patient is following medication instructions. HPI Completed by Dr. Baruch Goldmann  Medical History: Cataracts High Blood Pressure  Review of Systems Negative Allergic/Immunologic Negative Cardiovascular Negative Constitutional Negative Ear, Nose, Mouth & Throat Negative Endocrine Negative Eyes Negative Gastrointestinal Negative Genitourinary Negative Hemotologic/Lymphatic Negative Integumentary Negative Musculoskeletal Negative Neurological Negative Psychiatry Negative Respiratory  Social   Never smoked   Medication Prednisolone-gatiflox-bromfenac,  Repatha, Pregabalin, Aspirin, Lbesartan, Metoprolol,   Sx/Procedures Eyelid Lesion - Chalazion Excision - single, Phaco c IOL OD,  Bypass Surgery, Rotator Cuff,   Drug Allergies   NKDA  History & Physical: Heent:  Cataract, Left eye NECK: supple without bruits LUNGS: lungs clear to auscultation CV: regular rate and rhythm Abdomen: soft and non-tender  Impression & Plan: Assessment: 1.  CATARACT EXTRACTION STATUS; Right Eye (Z98.41) 2.  COMBINED FORMS AGE RELATED CATARACT; Left Eye (H25.812)  Plan: 1.  1 week after cataract surgery. Doing well with improved vision and  normal eye pressure. Call with any problems or concerns. Continue Pred-Moxi-Brom 2x/day for 3 more weeks. 2.  Cataract accounts for the patient's decreased vision. This visual impairment is not correctable with a tolerable change in glasses or contact lenses. Cataract surgery with an implantation of a new lens should significantly improve the visual and functional status of the patient. Discussed all risks, benefits, alternatives, and potential complications. Discussed the procedures and recovery. Patient desires to have surgery. A-scan ordered and performed today for intra-ocular lens calculations. The surgery will be performed in order to improve vision for driving, reading, and for eye examinations. Recommend phacoemulsification with intra-ocular lens. Recommend Dextenza for post-operative pain and inflammation. Left Eye. Surgery required to correct imbalance of vision. Dilates well - shugarcaine by protocol.

## 2021-02-27 ENCOUNTER — Encounter (HOSPITAL_COMMUNITY): Payer: Self-pay

## 2021-02-27 ENCOUNTER — Encounter (HOSPITAL_COMMUNITY)
Admission: RE | Admit: 2021-02-27 | Discharge: 2021-02-27 | Disposition: A | Discharge status: Home / Self Care | Payer: PPO | Source: Ambulatory Visit | Attending: Ophthalmology | Admitting: Ophthalmology

## 2021-02-27 ENCOUNTER — Other Ambulatory Visit: Payer: Self-pay

## 2021-02-27 DIAGNOSIS — M25562 Pain in left knee: Secondary | ICD-10-CM | POA: Diagnosis not present

## 2021-03-01 ENCOUNTER — Other Ambulatory Visit: Payer: Self-pay

## 2021-03-01 ENCOUNTER — Other Ambulatory Visit (HOSPITAL_COMMUNITY)
Admission: RE | Admit: 2021-03-01 | Discharge: 2021-03-01 | Disposition: A | Discharge status: Home / Self Care | Payer: PPO | Source: Ambulatory Visit | Attending: Ophthalmology | Admitting: Ophthalmology

## 2021-03-01 DIAGNOSIS — Z01812 Encounter for preprocedural laboratory examination: Secondary | ICD-10-CM | POA: Diagnosis not present

## 2021-03-01 DIAGNOSIS — Z20822 Contact with and (suspected) exposure to covid-19: Secondary | ICD-10-CM | POA: Diagnosis not present

## 2021-03-02 LAB — SARS CORONAVIRUS 2 (TAT 6-24 HRS): SARS Coronavirus 2: NEGATIVE

## 2021-03-04 ENCOUNTER — Encounter (HOSPITAL_COMMUNITY): Payer: Self-pay | Admitting: Ophthalmology

## 2021-03-04 ENCOUNTER — Ambulatory Visit (HOSPITAL_COMMUNITY)
Admission: RE | Admit: 2021-03-04 | Discharge: 2021-03-04 | Disposition: A | Discharge status: Home / Self Care | Payer: PPO | Attending: Ophthalmology | Admitting: Ophthalmology

## 2021-03-04 ENCOUNTER — Ambulatory Visit (HOSPITAL_COMMUNITY): Payer: PPO | Admitting: Anesthesiology

## 2021-03-04 ENCOUNTER — Encounter (HOSPITAL_COMMUNITY)
Admission: RE | Disposition: A | Discharge status: Home / Self Care | Payer: Self-pay | Source: Home / Self Care | Attending: Ophthalmology

## 2021-03-04 DIAGNOSIS — H25812 Combined forms of age-related cataract, left eye: Secondary | ICD-10-CM | POA: Diagnosis not present

## 2021-03-04 DIAGNOSIS — Z9841 Cataract extraction status, right eye: Secondary | ICD-10-CM | POA: Diagnosis not present

## 2021-03-04 DIAGNOSIS — Z7952 Long term (current) use of systemic steroids: Secondary | ICD-10-CM | POA: Diagnosis not present

## 2021-03-04 DIAGNOSIS — Z79899 Other long term (current) drug therapy: Secondary | ICD-10-CM | POA: Diagnosis not present

## 2021-03-04 DIAGNOSIS — Z7982 Long term (current) use of aspirin: Secondary | ICD-10-CM | POA: Diagnosis not present

## 2021-03-04 DIAGNOSIS — I2510 Atherosclerotic heart disease of native coronary artery without angina pectoris: Secondary | ICD-10-CM | POA: Diagnosis not present

## 2021-03-04 HISTORY — PX: CATARACT EXTRACTION W/PHACO: SHX586

## 2021-03-04 SURGERY — PHACOEMULSIFICATION, CATARACT, WITH IOL INSERTION
Anesthesia: Monitor Anesthesia Care | Site: Eye | Laterality: Left

## 2021-03-04 MED ORDER — STERILE WATER FOR IRRIGATION IR SOLN
Status: DC | PRN
  Administered 2021-03-04: 250 mL

## 2021-03-04 MED ORDER — PHENYLEPHRINE-KETOROLAC 1-0.3 % IO SOLN
INTRAOCULAR | Status: AC
  Filled 2021-03-04: qty 4

## 2021-03-04 MED ORDER — MIDAZOLAM HCL 5 MG/5ML IJ SOLN
INTRAMUSCULAR | Status: DC | PRN
  Administered 2021-03-04: 2 mg via INTRAVENOUS

## 2021-03-04 MED ORDER — SODIUM CHLORIDE 0.9% FLUSH
INTRAVENOUS | Status: DC | PRN
  Administered 2021-03-04: 10 mL via INTRAVENOUS

## 2021-03-04 MED ORDER — LIDOCAINE HCL (PF) 1 % IJ SOLN
INTRAOCULAR | Status: DC | PRN
  Administered 2021-03-04: 1 mL via OPHTHALMIC

## 2021-03-04 MED ORDER — EPINEPHRINE PF 1 MG/ML IJ SOLN
INTRAMUSCULAR | Status: AC
  Filled 2021-03-04: qty 1

## 2021-03-04 MED ORDER — PHENYLEPHRINE-KETOROLAC 1-0.3 % IO SOLN
INTRAOCULAR | Status: DC | PRN
  Administered 2021-03-04: 500 mL via OPHTHALMIC

## 2021-03-04 MED ORDER — TROPICAMIDE 1 % OP SOLN
1.0000 [drp] | OPHTHALMIC | Status: AC
Start: 2021-03-04 — End: 2021-03-04
  Administered 2021-03-04 (×3): 1 [drp] via OPHTHALMIC

## 2021-03-04 MED ORDER — NEOMYCIN-POLYMYXIN-DEXAMETH 3.5-10000-0.1 OP SUSP
OPHTHALMIC | Status: DC | PRN
  Administered 2021-03-04: 1 [drp] via OPHTHALMIC

## 2021-03-04 MED ORDER — BSS IO SOLN
INTRAOCULAR | Status: DC | PRN
  Administered 2021-03-04: 15 mL via INTRAOCULAR

## 2021-03-04 MED ORDER — TETRACAINE HCL 0.5 % OP SOLN
1.0000 [drp] | OPHTHALMIC | Status: AC | PRN
  Administered 2021-03-04 (×3): 1 [drp] via OPHTHALMIC

## 2021-03-04 MED ORDER — LIDOCAINE HCL 3.5 % OP GEL
1.0000 "application " | Freq: Once | OPHTHALMIC | Status: AC
  Administered 2021-03-04: 1 via OPHTHALMIC

## 2021-03-04 MED ORDER — MIDAZOLAM HCL 2 MG/2ML IJ SOLN
INTRAMUSCULAR | Status: AC
  Filled 2021-03-04: qty 2

## 2021-03-04 MED ORDER — SODIUM HYALURONATE 23 MG/ML IO SOLN
INTRAOCULAR | Status: DC | PRN
  Administered 2021-03-04: 0.6 mL via INTRAOCULAR

## 2021-03-04 MED ORDER — PROVISC 10 MG/ML IO SOLN
INTRAOCULAR | Status: DC | PRN
  Administered 2021-03-04: 0.85 mL via INTRAOCULAR

## 2021-03-04 MED ORDER — PHENYLEPHRINE HCL 2.5 % OP SOLN
1.0000 [drp] | OPHTHALMIC | Status: AC | PRN
  Administered 2021-03-04 (×3): 1 [drp] via OPHTHALMIC

## 2021-03-04 MED ORDER — POVIDONE-IODINE 5 % OP SOLN
OPHTHALMIC | Status: DC | PRN
  Administered 2021-03-04: 1 via OPHTHALMIC

## 2021-03-04 SURGICAL SUPPLY — 13 items
CLOTH BEACON ORANGE TIMEOUT ST (SAFETY) ×2 IMPLANT
EYE SHIELD UNIVERSAL CLEAR (GAUZE/BANDAGES/DRESSINGS) ×2 IMPLANT
GLOVE SURG UNDER POLY LF SZ6.5 (GLOVE) ×2 IMPLANT
GLOVE SURG UNDER POLY LF SZ7 (GLOVE) ×2 IMPLANT
NEEDLE HYPO 18GX1.5 BLUNT FILL (NEEDLE) ×2 IMPLANT
PAD ARMBOARD 7.5X6 YLW CONV (MISCELLANEOUS) ×2 IMPLANT
RING MALYGIN 7.0 (MISCELLANEOUS) IMPLANT
SYR TB 1ML LL NO SAFETY (SYRINGE) ×2 IMPLANT
TAPE SURG TRANSPORE 1 IN (GAUZE/BANDAGES/DRESSINGS) ×1 IMPLANT
TAPE SURGICAL TRANSPORE 1 IN (GAUZE/BANDAGES/DRESSINGS) ×2
TECNIS IOL (Intraocular Lens) ×2 IMPLANT
VISCOELASTIC ADDITIONAL (OPHTHALMIC RELATED) IMPLANT
WATER STERILE IRR 250ML POUR (IV SOLUTION) ×2 IMPLANT

## 2021-03-04 NOTE — Transfer of Care (Signed)
Immediate Anesthesia Transfer of Care Note  Patient: [name]  Procedure(s) Performed: CATARACT EXTRACTION PHACO AND INTRAOCULAR LENS PLACEMENT (IOC) (Left Eye)  Patient Location: Endoscopy Unit  Anesthesia Type:MAC  Level of Consciousness: awake  Airway & Oxygen Therapy: Patient Spontanous Breathing  Post-op Assessment: Report given to RN  Post vital signs: Reviewed and stable  Last Vitals:  Vitals Value Taken Time  BP    Temp    Pulse    Resp    SpO2      Last Pain:  Vitals:   03/04/21 0930  TempSrc: Oral  PainSc: 0-No pain      Patients Stated Pain Goal: 5 (46/65/99 3570)  Complications: No complications documented.

## 2021-03-04 NOTE — Interval H&P Note (Signed)
History and Physical Interval Note:  03/04/2021 10:15 AM  [name]  has presented today for surgery, with the diagnosis of Nuclear sclerotic cataract - Left eye.  The various methods of treatment have been discussed with the patient and family. After consideration of risks, benefits and other options for treatment, the patient has consented to  Procedure(s) with comments: CATARACT EXTRACTION PHACO AND INTRAOCULAR LENS PLACEMENT ([place]) (Left) - left as a surgical intervention.  The patient's history has been reviewed, patient examined, no change in status, stable for surgery.  I have reviewed the patient's chart and labs.  Questions were answered to the patient's satisfaction.     Baruch Goldmann

## 2021-03-04 NOTE — Anesthesia Preprocedure Evaluation (Signed)
Anesthesia Evaluation  Patient identified by MRN, date of birth, ID band Patient awake    Reviewed: Allergy & Precautions, NPO status , Patient's Chart, lab work & pertinent test results, reviewed documented beta blocker date and time   Airway Mallampati: II  TM Distance: >3 FB Neck ROM: Full    Dental  (+) Dental Advisory Given, Caps   Pulmonary neg pulmonary ROS,    Pulmonary exam normal breath sounds clear to auscultation       Cardiovascular Exercise Tolerance: Good hypertension, Pt. on medications and Pt. on home beta blockers + CAD and + CABG  Normal cardiovascular exam Rhythm:Regular Rate:Normal     Neuro/Psych  Neuromuscular disease negative psych ROS   GI/Hepatic negative GI ROS, Neg liver ROS,   Endo/Other  negative endocrine ROS  Renal/GU negative Renal ROS     Musculoskeletal negative musculoskeletal ROS (+)   Abdominal   Peds  Hematology negative hematology ROS (+)   Anesthesia Other Findings   Reproductive/Obstetrics negative OB ROS                             Anesthesia Physical Anesthesia Plan  ASA: III  Anesthesia Plan: MAC   Post-op Pain Management:    Induction:   PONV Risk Score and Plan:   Airway Management Planned: Nasal Cannula and Natural Airway  Additional Equipment:   Intra-op Plan:   Post-operative Plan:   Informed Consent: I have reviewed the patients History and Physical, chart, labs and discussed the procedure including the risks, benefits and alternatives for the proposed anesthesia with the patient or authorized representative who has indicated his/her understanding and acceptance.     Dental advisory given  Plan Discussed with: CRNA and Surgeon  Anesthesia Plan Comments:         Anesthesia Quick Evaluation

## 2021-03-04 NOTE — Op Note (Signed)
Date of procedure: 03/04/21  Pre-operative diagnosis: Visually significant age-related combined cataract, Left Eye (H25.812)  Post-operative diagnosis: Visually significant age-related combined cataract, Left Eye (H25.812)  Procedure: Removal of cataract via phacoemulsification and insertion of intra-ocular lens Wynetta Emery and [place]  +19.5D into the capsular bag of the Left Eye  Attending surgeon: Gerda Diss. Jamaria Amborn, MD, MA  Anesthesia: MAC, Topical Akten  Complications: None  Estimated Blood Loss: <22m (minimal)  Specimens: None  Implants: As above  Indications:  Visually significant age-related cataract, Left Eye  Procedure:  The patient was seen and identified in the pre-operative area. The operative eye was identified and dilated.  The operative eye was marked.  Topical anesthesia was administered to the operative eye.     The patient was then to the operative suite and placed in the supine position.  A timeout was performed confirming the patient, procedure to be performed, and all other relevant information.   The patient's face was prepped and draped in the usual fashion for intra-ocular surgery.  A lid speculum was placed into the operative eye and the surgical microscope moved into place and focused.  An inferotemporal paracentesis was created using a 20 gauge paracentesis blade.  Shugarcaine was injected into the anterior chamber.  Viscoelastic was injected into the anterior chamber.  A temporal clear-corneal main wound incision was created using a 2.454mmicrokeratome.  A continuous curvilinear capsulorrhexis was initiated using an irrigating cystitome and completed using capsulorrhexis forceps.  Hydrodissection and hydrodeliniation were performed.  Viscoelastic was injected into the anterior chamber.  A phacoemulsification handpiece and a chopper as a second instrument were used to remove the nucleus and epinucleus. The irrigation/aspiration handpiece was used to remove  any remaining cortical material.   The capsular bag was reinflated with viscoelastic, checked, and found to be intact.  The intraocular lens was inserted into the capsular bag.  The irrigation/aspiration handpiece was used to remove any remaining viscoelastic.  The clear corneal wound and paracentesis wounds were then hydrated and checked with Weck-Cels to be watertight.  The lid-speculum was removed.  The drape was removed.  The patient's face was cleaned with a wet and dry 4x4.   Maxitrol was instilled in the eye. A clear shield was taped over the eye. The patient was taken to the post-operative care unit in good condition, having tolerated the procedure well.  Post-Op Instructions: [facility] and will receive all other orders and instructions.

## 2021-03-04 NOTE — Discharge Instructions (Signed)
Please discharge patient when stable, will follow up today with Dr. Kennis Buell at the [facility] office immediately following discharge.  Leave shield in place until visit.  All paperwork with discharge instructions will be given at the office.   [facility] Address:  [address]  [place], [place] [address]  

## 2021-03-04 NOTE — Anesthesia Postprocedure Evaluation (Signed)
Anesthesia Post Note  Patient: Maryland Luppino  Procedure(s) Performed: CATARACT EXTRACTION PHACO AND INTRAOCULAR LENS PLACEMENT ([place]) (Left Eye)  Patient location during evaluation: Short Stay Anesthesia Type: MAC Level of consciousness: awake and alert and oriented Pain management: pain level controlled Vital Signs Assessment: post-procedure vital signs reviewed and stable Respiratory status: spontaneous breathing Cardiovascular status: blood pressure returned to baseline and stable Postop Assessment: no apparent nausea or vomiting Anesthetic complications: no   No complications documented.   Last Vitals:  Vitals:   03/04/21 0930  BP: 125/79  Pulse: (!) 58  Resp: 14  Temp: 36.4 C  SpO2: 100%    Last Pain:  Vitals:   03/04/21 0930  TempSrc: Oral  PainSc: 0-No pain                 Francesca Strome

## 2021-03-05 ENCOUNTER — Encounter (HOSPITAL_COMMUNITY): Payer: Self-pay | Admitting: Ophthalmology

## 2021-03-11 ENCOUNTER — Other Ambulatory Visit (HOSPITAL_COMMUNITY): Payer: PPO

## 2021-03-15 ENCOUNTER — Other Ambulatory Visit (HOSPITAL_COMMUNITY): Payer: PPO

## 2021-04-01 DIAGNOSIS — I2510 Atherosclerotic heart disease of native coronary artery without angina pectoris: Secondary | ICD-10-CM | POA: Diagnosis not present

## 2021-04-01 DIAGNOSIS — I8390 Asymptomatic varicose veins of unspecified lower extremity: Secondary | ICD-10-CM | POA: Diagnosis not present

## 2021-04-22 DIAGNOSIS — Z1283 Encounter for screening for malignant neoplasm of skin: Secondary | ICD-10-CM | POA: Diagnosis not present

## 2021-04-22 DIAGNOSIS — L728 Other follicular cysts of the skin and subcutaneous tissue: Secondary | ICD-10-CM | POA: Diagnosis not present

## 2021-04-22 DIAGNOSIS — C4441 Basal cell carcinoma of skin of scalp and neck: Secondary | ICD-10-CM | POA: Diagnosis not present

## 2021-04-22 DIAGNOSIS — D225 Melanocytic nevi of trunk: Secondary | ICD-10-CM | POA: Diagnosis not present

## 2021-04-22 DIAGNOSIS — B0089 Other herpesviral infection: Secondary | ICD-10-CM | POA: Diagnosis not present

## 2021-04-30 ENCOUNTER — Other Ambulatory Visit: Payer: Self-pay

## 2021-04-30 DIAGNOSIS — I8392 Asymptomatic varicose veins of left lower extremity: Secondary | ICD-10-CM

## 2021-04-30 DIAGNOSIS — I8390 Asymptomatic varicose veins of unspecified lower extremity: Secondary | ICD-10-CM

## 2021-05-07 ENCOUNTER — Ambulatory Visit: Payer: PPO | Admitting: Family Medicine

## 2021-05-15 NOTE — Progress Notes (Signed)
Chief Complaint  Patient presents with  . Follow-up    RM 1, alone. Neuropathy- Pt presents today stable with no worsening or new SX.      HISTORY OF PRESENT ILLNESS: 05/16/21 ALL:  [name] is a 70 y.o. male here today for follow up for peripheral neuropathy. He was started on Lyrica 50mg  QD in 10/2020. Labs showed positive ANA and referral to rheumatology placed. He was seen, vasculitis labs negative and no follow up recommended. Failed gabapentin due to fatigue.   He is doing well. Neuropathy is stable. He is tolerating medication. He feels that neuropathy is worse when drinking alcohol. He had one fall. He rolled his ankle on uneven flooring at a football party. No injuries. PCP is refilling meds.    HISTORY (copied from Dr Gladstone Lighter previous note)  UPDATE (11/05/20, VRP): Since last visit, neuropathy symptoms have gradually progressed.  Now having intermittent shooting pains, burning sensation in his feet.  Patient tried gabapentin a few years ago but this caused side effects of fatigue.  He is willing to trial medication.  Also willing to try some other testing.  Symptoms mainly affecting his bilateral feet, slightly coming up to his shins.  PRIOR HPI ([id number]): 70 year old male here for evaluation of lower immediate shooting pain in his feet.  For past 1-2 years patient has had swollen sensation on the bottom of his feet, hot and cold sensations, balance difficulty, left worse than right foot, with intermittent sharp shooting pains.  At times patient feels heat in his feet at times it feels cold.  Patient has some low back pain and stiffness.  Patient having some balance issues.  B12 322.    REVIEW OF SYSTEMS: Out of a complete 14 system review of symptoms, the patient complains only of the following symptoms, numbness and tingling in feet and all other reviewed systems are negative.    ALLERGIES: Allergies  Allergen Reactions  . Gabapentin Other (See Comments)     fatigue     HOME MEDICATIONS: Outpatient Medications Prior to Visit  Medication Sig Dispense Refill  . acetaminophen (TYLENOL) 325 MG tablet Take 650 mg by mouth daily as needed for moderate pain or headache.    Marland Kitchen aspirin 81 MG tablet Take 1 tablet (81 mg total) by mouth daily. 30 tablet   . irbesartan (AVAPRO) 75 MG tablet Take 37.5 mg by mouth daily.    . metoprolol tartrate (LOPRESSOR) 25 MG tablet Take 12.5 mg by mouth every morning.    . Multiple Vitamins-Minerals (MULTIVITAMIN WITH MINERALS) tablet Take 1 tablet by mouth daily.    . pregabalin (LYRICA) 50 MG capsule Take 50 mg by mouth daily.    Marland Kitchen REPATHA SURECLICK 381 MG/ML SOAJ INJECT 1 DOSE INTO THE SKIN EVERY 14 DAYS. (Patient taking differently: Inject 140 mg into the muscle every 14 (fourteen) days.) 2 mL 11  . sildenafil (REVATIO) 20 MG tablet Take 40-100 mg by mouth daily as needed (erectile dysfunction).    . irbesartan (AVAPRO) 75 MG tablet Take 75 mg by mouth daily.    . pregabalin (LYRICA) 50 MG capsule Take 1 capsule (50 mg total) by mouth 2 (two) times daily. (Patient taking differently: Take 50 mg by mouth daily.) 60 capsule 12   No facility-administered medications prior to visit.     PAST MEDICAL HISTORY: Past Medical History:  Diagnosis Date  . Coronary artery disease    a. s/p CABG in 2014 with LIMA-LAD and SVG-D1 - performed in  [place]  . Fracture, ribs   . Heart disease   . Hyperlipidemia   . Hypertension   . Peripheral neuropathy      PAST SURGICAL HISTORY: Past Surgical History:  Procedure Laterality Date  . CATARACT EXTRACTION W/PHACO Right 02/15/2021   Procedure: CATARACT EXTRACTION PHACO AND INTRAOCULAR LENS PLACEMENT (IOC);  Surgeon: Baruch Goldmann, MD;  Location: [facility];  Service: Ophthalmology;  Laterality: Right;  CDE 8.65  . CATARACT EXTRACTION W/PHACO Left 03/04/2021   Procedure: CATARACT EXTRACTION PHACO AND INTRAOCULAR LENS PLACEMENT (IOC);  Surgeon: Baruch Goldmann, MD;  Location: [facility];   Service: Ophthalmology;  Laterality: Left;  CDE: 8.15  . COLONOSCOPY N/A 03/11/2017   Procedure: COLONOSCOPY;  Surgeon: Rogene Houston, MD;  [facility];  Service: Endoscopy;  Laterality: N/A;  930  . CORONARY ARTERY BYPASS GRAFT  2014  . HERNIA REPAIR  2010  . POLYPECTOMY  03/11/2017   Procedure: POLYPECTOMY;  Surgeon: Rogene Houston, MD;  [facility];  Service: Endoscopy;;  colon  . ROTATOR CUFF REPAIR Right 2014  . vericous veins  2001     FAMILY HISTORY: Family History  Problem Relation Age of Onset  . Heart disease Father   . Dementia Sister   . Thyroid disease Sister   . Hypertension Sister   . Stroke Mother      SOCIAL HISTORY: Social History   Socioeconomic History  . Marital status: Single    Spouse name: Not on file  . Number of children: 2  . Years of education: 53  . Highest education level: Not on file  Occupational History    Comment: retired  Tobacco Use  . Smoking status: Never Smoker  . Smokeless tobacco: Never Used  Vaping Use  . Vaping Use: Never used  Substance and Sexual Activity  . Alcohol use: Yes    Alcohol/week: 0.0 standard drinks    Comment: Bourbon on the weekends x 4  . Drug use: No  . Sexual activity: Not on file  Other Topics Concern  . Not on file  Social History Narrative   Lives with sig other   caffeine-  A little   Social Determinants of Health   Financial Resource Strain: Not on file  Food Insecurity: Not on file  Transportation Needs: Not on file  Physical Activity: Not on file  Stress: Not on file  Social Connections: Not on file  Intimate Partner Violence: Not on file      PHYSICAL EXAM  Vitals:   05/16/21 0915  BP: 132/73  Pulse: 60  Weight: 186 lb (84.4 kg)  Height: 5\' 8"  (1.727 m)   Body mass index is 28.28 kg/m.   Generalized: Well developed, in no acute distress  Cardiology: normal rate and rhythm, no murmur auscultated  Respiratory: clear to auscultation bilaterally     Neurological examination  Mentation: Alert oriented to time, place, history taking. Follows all commands speech and language fluent Cranial nerve II-XII: Pupils were equal round reactive to light. Extraocular movements were full, visual field were full on confrontational test. Facial sensation and strength were normal. Head turning and shoulder shrug  were normal and symmetric. Motor: The motor testing reveals 5 over 5 strength of all 4 extremities. Good symmetric motor tone is noted throughout.  Sensory: Sensory testing is intact to soft touch on all 4 extremities. No evidence of extinction is noted.  Gait and station: Gait is normal.     DIAGNOSTIC DATA (LABS, IMAGING, TESTING) -  I reviewed patient records, labs, notes, testing and imaging myself where available.  No results found for: WBC, HGB, HCT, MCV, PLT    Component Value Date/Time   NA 139 07/03/2015 0953   K 4.3 07/03/2015 0953   CL 103 07/03/2015 0953   CO2 26 07/03/2015 0953   GLUCOSE 88 07/03/2015 0953   BUN 21 07/03/2015 0953   CREATININE 0.98 07/03/2015 0953   CALCIUM 9.6 07/03/2015 0953   PROT 7.3 11/06/2020 0738   Lab Results  Component Value Date   CHOL 101 11/28/2019   HDL 33 (L) 11/28/2019   LDLCALC 44 11/28/2019   TRIG 137 11/28/2019   CHOLHDL 3.1 11/28/2019   Lab Results  Component Value Date   HGBA1C 5.5 11/06/2020   Lab Results  Component Value Date   QBHALPFX90 240 11/06/2020   Lab Results  Component Value Date   TSH 2.640 11/06/2020    No flowsheet data found.   No flowsheet data found.   ASSESSMENT AND PLAN  70 y.o. year old male  has a past medical history of Coronary artery disease, Fracture, ribs, Heart disease, Hyperlipidemia, Hypertension, and Peripheral neuropathy. here with     Neuropathy  Positive ANA (antinuclear antibody)  [place] is doing well, today. Neuropathy is stable on Lyrica 50mg  daily. He will continue current plan. May use OTC supplements as discussed. He  will continue healthy lifestyle habits. Alcohol in moderation. He will follow up with me in 1 year, then as needed. He verbalizes understanding and agreement with this plan.   No orders of the defined types were placed in this encounter.    No orders of the defined types were placed in this encounter.    Debbora Presto, MSN, FNP-C 05/16/2021, 10:59 AM  [facility] [address] [place], [place] [address] [phone]

## 2021-05-15 NOTE — Patient Instructions (Signed)
Below is our plan:  We will continue Lyrica 50mg  at bedtime. Consider capsaicin cream, lidocaine patch / cream, alpha-lipoic acid 600mg  daily.   Please make sure you are staying well hydrated. I recommend 50-60 ounces daily. Well balanced diet and regular exercise encouraged. Consistent sleep schedule with 6-8 hours recommended.   Please continue follow up with care team as directed.   Follow up with me in 1 year   You may receive a survey regarding today's visit. I encourage you to leave honest feed back as I do use this information to improve patient care. Thank you for seeing me today!     Peripheral Neuropathy Peripheral neuropathy is a type of nerve damage. It affects nerves that carry signals between the spinal cord and the arms, legs, and the rest of the body (peripheral nerves). It does not affect nerves in the spinal cord or brain. In peripheral neuropathy, one nerve or a group of nerves may be damaged. Peripheral neuropathy is a broad category that includes many specific nerve disorders, like diabetic neuropathy, hereditary neuropathy, and carpal tunnel syndrome. What are the causes? This condition may be caused by:  Diabetes. This is the most common cause of peripheral neuropathy.  Nerve injury.  Pressure or stress on a nerve that lasts a long time.  Lack (deficiency) of B vitamins. This can result from alcoholism, poor diet, or a restricted diet.  Infections.  Autoimmune diseases, such as rheumatoid arthritis and systemic lupus erythematosus.  Nerve diseases that are passed from parent to child (inherited).  Some medicines, such as cancer medicines (chemotherapy).  Poisonous (toxic) substances, such as lead and mercury.  Too little blood flowing to the legs.  Kidney disease.  Thyroid disease. In some cases, the cause of this condition is not known. What are the signs or symptoms? Symptoms of this condition depend on which of your nerves is damaged. Common  symptoms include:  Loss of feeling (numbness) in the feet, hands, or both.  Tingling in the feet, hands, or both.  Burning pain.  Very sensitive skin.  Weakness.  Not being able to move a part of the body (paralysis).  Muscle twitching.  Clumsiness or poor coordination.  Loss of balance.  Not being able to control your bladder.  Feeling dizzy.  Sexual problems. How is this diagnosed? Diagnosing and finding the cause of peripheral neuropathy can be difficult. Your health care provider will take your medical history and do a physical exam. A neurological exam will also be done. This involves checking things that are affected by your brain, spinal cord, and nerves (nervous system). For example, your health care provider will check your reflexes, how you move, and what you can feel. You may have other tests, such as:  Blood tests.  Electromyogram (EMG) and nerve conduction tests. These tests check nerve function and how well the nerves are controlling the muscles.  Imaging tests, such as CT scans or MRI to rule out other causes of your symptoms.  Removing a small piece of nerve to be examined in a lab (nerve biopsy).  Removing and examining a small amount of the fluid that surrounds the brain and spinal cord (lumbar puncture). How is this treated? Treatment for this condition may involve:  Treating the underlying cause of the neuropathy, such as diabetes, kidney disease, or vitamin deficiencies.  Stopping medicines that can cause neuropathy, such as chemotherapy.  Medicine to help relieve pain. Medicines may include: ? Prescription or over-the-counter pain medicine. ? Antiseizure medicine. ?  Antidepressants. ? Pain-relieving patches that are applied to painful areas of skin.  Surgery to relieve pressure on a nerve or to destroy a nerve that is causing pain.  Physical therapy to help improve movement and balance.  Devices to help you move around (assistive  devices). Follow these instructions at home: Medicines  Take over-the-counter and prescription medicines only as told by your health care provider. Do not take any other medicines without first asking your health care provider.  Do not drive or use heavy machinery while taking prescription pain medicine. Lifestyle  Do not use any products that contain nicotine or tobacco, such as cigarettes and e-cigarettes. Smoking keeps blood from reaching damaged nerves. If you need help quitting, ask your health care provider.  Avoid or limit alcohol. Too much alcohol can cause a vitamin B deficiency, and vitamin B is needed for healthy nerves.  Eat a healthy diet. This includes: ? Eating foods that are high in fiber, such as fresh fruits and vegetables, whole grains, and beans. ? Limiting foods that are high in fat and processed sugars, such as fried or sweet foods.   General instructions  If you have diabetes, work closely with your health care provider to keep your blood sugar under control.  If you have numbness in your feet: ? Check every day for signs of injury or infection. Watch for redness, warmth, and swelling. ? Wear padded socks and comfortable shoes. These help protect your feet.  Develop a good support system. Living with peripheral neuropathy can be stressful. Consider talking with a mental health specialist or joining a support group.  Use assistive devices and attend physical therapy as told by your health care provider. This may include using a walker or a cane.  Keep all follow-up visits as told by your health care provider. This is important.   Contact a health care provider if:  You have new signs or symptoms of peripheral neuropathy.  You are struggling emotionally from dealing with peripheral neuropathy.  Your pain is not well-controlled. Get help right away if:  You have an injury or infection that is not healing normally.  You develop new weakness in an arm or  leg.  You have fallen or do so frequently. Summary  Peripheral neuropathy is when the nerves in the arms, or legs are damaged, resulting in numbness, weakness, or pain.  There are many causes of peripheral neuropathy, including diabetes, pinched nerves, vitamin deficiencies, autoimmune disease, and hereditary conditions.  Diagnosing and finding the cause of peripheral neuropathy can be difficult. Your health care provider will take your medical history, do a physical exam, and do tests, including blood tests and nerve function tests.  Treatment involves treating the underlying cause of the neuropathy and taking medicines to help control pain. Physical therapy and assistive devices may also help. This information is not intended to replace advice given to you by your health care provider. Make sure you discuss any questions you have with your health care provider. Document Revised: 09/18/2020 Document Reviewed: 09/18/2020 Elsevier Patient Education  2021 [employer].

## 2021-05-16 ENCOUNTER — Encounter: Payer: Self-pay | Admitting: Family Medicine

## 2021-05-16 ENCOUNTER — Ambulatory Visit: Payer: PPO | Admitting: Family Medicine

## 2021-05-16 VITALS — BP 132/73 | HR 60 | Ht 68.0 in | Wt 186.0 lb

## 2021-05-16 DIAGNOSIS — R768 Other specified abnormal immunological findings in serum: Secondary | ICD-10-CM | POA: Diagnosis not present

## 2021-05-16 DIAGNOSIS — G629 Polyneuropathy, unspecified: Secondary | ICD-10-CM

## 2021-05-21 NOTE — Progress Notes (Signed)
I reviewed note and agree with plan.   Penni Bombard, MD 0/08/2329, 0:76 PM Certified in Neurology, Neurophysiology and Neuroimaging  [facility] [address] [place], [place] [address] [phone]

## 2021-05-28 ENCOUNTER — Ambulatory Visit: Payer: PPO | Admitting: Vascular Surgery

## 2021-05-28 ENCOUNTER — Ambulatory Visit (HOSPITAL_COMMUNITY)
Admission: RE | Admit: 2021-05-28 | Discharge: 2021-05-28 | Disposition: A | Discharge status: Home / Self Care | Payer: PPO | Source: Ambulatory Visit | Attending: Vascular Surgery | Admitting: Vascular Surgery

## 2021-05-28 ENCOUNTER — Encounter: Payer: Self-pay | Admitting: Vascular Surgery

## 2021-05-28 ENCOUNTER — Other Ambulatory Visit: Payer: Self-pay

## 2021-05-28 DIAGNOSIS — I8390 Asymptomatic varicose veins of unspecified lower extremity: Secondary | ICD-10-CM | POA: Diagnosis not present

## 2021-05-28 DIAGNOSIS — I872 Venous insufficiency (chronic) (peripheral): Secondary | ICD-10-CM | POA: Diagnosis not present

## 2021-05-28 NOTE — Progress Notes (Signed)
Patient name: [name] MRN: [id number] DOB: 02-05-1951 Sex: male  REASON FOR CONSULT: Evaluate varicose veins and lower extremity reflux  HPI: [name] is a 70 y.o. male, with history of hypertension and hyperlipidemia and coronary artery disease that presents for evaluation of lower extremity varicose veins and reflux.  Patient states that he has noticed a number of varicosities on both lower extremities and wanted to get them checked out.  His son recently had a DVT and he became more concerned about his lower legs.  He states his legs do not really bother him and he is having no pain.  He specifically denies any heaviness, achiness, burning, stinging etc.  He did have what sounds like stab phlebectomies done in [place] around 2001 and states he had 20+ incisions to both lower extremities.  He does not remember any laser ablations or other procedures.  He denies any history of DVT.  Past Medical History:  Diagnosis Date  . Coronary artery disease    a. s/p CABG in 2014 with LIMA-LAD and SVG-D1 - performed in [place]  . Fracture, ribs   . Heart disease   . Hyperlipidemia   . Hypertension   . Peripheral neuropathy     Past Surgical History:  Procedure Laterality Date  . CATARACT EXTRACTION W/PHACO Right 02/15/2021   Procedure: CATARACT EXTRACTION PHACO AND INTRAOCULAR LENS PLACEMENT (IOC);  Surgeon: Baruch Goldmann, MD;  Location: [facility];  Service: Ophthalmology;  Laterality: Right;  CDE 8.65  . CATARACT EXTRACTION W/PHACO Left 03/04/2021   Procedure: CATARACT EXTRACTION PHACO AND INTRAOCULAR LENS PLACEMENT (IOC);  Surgeon: Baruch Goldmann, MD;  Location: [facility];  Service: Ophthalmology;  Laterality: Left;  CDE: 8.15  . COLONOSCOPY N/A 03/11/2017   Procedure: COLONOSCOPY;  Surgeon: Rogene Houston, MD;  [facility];  Service: Endoscopy;  Laterality: N/A;  930  . CORONARY ARTERY BYPASS GRAFT  2014  . HERNIA REPAIR  2010  . POLYPECTOMY  03/11/2017   Procedure: POLYPECTOMY;   Surgeon: Rogene Houston, MD;  [facility];  Service: Endoscopy;;  colon  . ROTATOR CUFF REPAIR Right 2014  . vericous veins  2001    Family History  Problem Relation Age of Onset  . Heart disease Father   . Dementia Sister   . Thyroid disease Sister   . Hypertension Sister   . Stroke Mother     SOCIAL HISTORY: Social History   Socioeconomic History  . Marital status: Single    Spouse name: Not on file  . Number of children: 2  . Years of education: 43  . Highest education level: Not on file  Occupational History    Comment: retired  Tobacco Use  . Smoking status: Never Smoker  . Smokeless tobacco: Never Used  Vaping Use  . Vaping Use: Never used  Substance and Sexual Activity  . Alcohol use: Yes    Alcohol/week: 0.0 standard drinks    Comment: Bourbon on the weekends x 4  . Drug use: No  . Sexual activity: Not on file  Other Topics Concern  . Not on file  Social History Narrative   Lives with sig other   caffeine-  A little   Social Determinants of Health   Financial Resource Strain: Not on file  Food Insecurity: Not on file  Transportation Needs: Not on file  Physical Activity: Not on file  Stress: Not on file  Social Connections: Not on file  Intimate Partner Violence: Not on  file    Allergies  Allergen Reactions  . Gabapentin Other (See Comments)    fatigue    Current Outpatient Medications  Medication Sig Dispense Refill  . acetaminophen (TYLENOL) 325 MG tablet Take 650 mg by mouth daily as needed for moderate pain or headache.    Marland Kitchen aspirin 81 MG tablet Take 1 tablet (81 mg total) by mouth daily. 30 tablet   . irbesartan (AVAPRO) 75 MG tablet Take 37.5 mg by mouth daily.    . metoprolol tartrate (LOPRESSOR) 25 MG tablet Take 12.5 mg by mouth every morning.    . Multiple Vitamins-Minerals (MULTIVITAMIN WITH MINERALS) tablet Take 1 tablet by mouth daily.    . pregabalin (LYRICA) 50 MG capsule Take 50 mg by mouth daily.    Marland Kitchen REPATHA  SURECLICK 161 MG/ML SOAJ INJECT 1 DOSE INTO THE SKIN EVERY 14 DAYS. (Patient taking differently: Inject 140 mg into the muscle every 14 (fourteen) days.) 2 mL 11  . sildenafil (REVATIO) 20 MG tablet Take 40-100 mg by mouth daily as needed (erectile dysfunction).     No current facility-administered medications for this visit.    REVIEW OF SYSTEMS:  [X]  denotes positive finding, [ ]  denotes negative finding Cardiac  Comments:  Chest pain or chest pressure:    Shortness of breath upon exertion:    Short of breath when lying flat:    Irregular heart rhythm:        Vascular    Pain in calf, thigh, or hip brought on by ambulation:    Pain in feet at night that wakes you up from your sleep:     Blood clot in your veins:    Leg swelling:         Pulmonary    Oxygen at home:    Productive cough:     Wheezing:         Neurologic    Sudden weakness in arms or legs:     Sudden numbness in arms or legs:     Sudden onset of difficulty speaking or slurred speech:    Temporary loss of vision in one eye:     Problems with dizziness:         Gastrointestinal    Blood in stool:     Vomited blood:         Genitourinary    Burning when urinating:     Blood in urine:        Psychiatric    Major depression:         Hematologic    Bleeding problems:    Problems with blood clotting too easily:        Skin    Rashes or ulcers:        Constitutional    Fever or chills:      PHYSICAL EXAM: Vitals:   05/28/21 1156  BP: 128/77  Pulse: (!) 57  Resp: 18  Temp: 97.6 F (36.4 C)  TempSrc: Temporal  SpO2: 98%  Weight: 182 lb (82.6 kg)  Height: 5\' 8"  (1.727 m)    GENERAL: The patient is a well-nourished male, in no acute distress. The vital signs are documented above. CARDIAC: There is a regular rate and rhythm.  VASCULAR:  Palpable femoral pulses bilaterally Palpable PT pulses bilaterally Varicosities bilateral lower extremities PULMONARY: No respiratory distress. ABDOMEN:  Soft and non-tender. MUSCULOSKELETAL: There are no major deformities or cyanosis. NEUROLOGIC: No focal weakness or paresthesias are detected. SKIN: There are no ulcers or  rashes noted. PSYCHIATRIC: The patient has a normal affect.  DATA:   No evidence of acute DVT on venous reflux study today.  He has deep reflux in the right leg plus superficial reflux in multiple varicosities.  He also has superficial reflux and varicosities in the left leg.  Assessment/Plan:   70 year old male presents with venous insufficiency in the bilateral lower extremities.  He does have evidence of fairly significant varicosities in both thighs and both calves.  Sounds like he had bilateral lower extremity stab phlebectomies done in 2001 in [place].  The tech was unable to identify any great saphenous vein and most of his reflux appeared to be in individual varicosities throughout bilateral lower extremities.  I suspect he may have had his GSV treated before - although we do not have any records.  That being said, he is having no significant symptoms of venous insufficiency and denies any leg heaviness, achiness, burning stinging etc.  He states he doesn't care how the varicosities appear.  I discussed in the absence of symptoms I would not recommend any intervention.  He can follow-up with me as needed in the future.  Also discussed no evidence of DVT which was a concern.   We discussed compression and leg elevation as primary modality of therapy.  Marty Heck, MD [facility] Office: [phone]

## 2021-06-06 DIAGNOSIS — Z85828 Personal history of other malignant neoplasm of skin: Secondary | ICD-10-CM | POA: Diagnosis not present

## 2021-06-06 DIAGNOSIS — L821 Other seborrheic keratosis: Secondary | ICD-10-CM | POA: Diagnosis not present

## 2021-06-06 DIAGNOSIS — Z08 Encounter for follow-up examination after completed treatment for malignant neoplasm: Secondary | ICD-10-CM | POA: Diagnosis not present

## 2021-08-05 ENCOUNTER — Other Ambulatory Visit: Payer: Self-pay | Admitting: Diagnostic Neuroimaging

## 2021-08-07 ENCOUNTER — Emergency Department (HOSPITAL_COMMUNITY): Payer: PPO

## 2021-08-07 ENCOUNTER — Encounter (HOSPITAL_COMMUNITY): Payer: Self-pay | Admitting: Emergency Medicine

## 2021-08-07 ENCOUNTER — Inpatient Hospital Stay (HOSPITAL_COMMUNITY): Payer: PPO

## 2021-08-07 ENCOUNTER — Other Ambulatory Visit: Payer: Self-pay

## 2021-08-07 ENCOUNTER — Inpatient Hospital Stay (HOSPITAL_COMMUNITY)
Admission: EM | Admit: 2021-08-07 | Discharge: 2021-08-16 | DRG: 439 | Disposition: A | Discharge status: Home / Self Care | Payer: PPO | Attending: Internal Medicine | Admitting: Internal Medicine

## 2021-08-07 DIAGNOSIS — E871 Hypo-osmolality and hyponatremia: Secondary | ICD-10-CM | POA: Diagnosis present

## 2021-08-07 DIAGNOSIS — K5900 Constipation, unspecified: Secondary | ICD-10-CM | POA: Diagnosis present

## 2021-08-07 DIAGNOSIS — I2510 Atherosclerotic heart disease of native coronary artery without angina pectoris: Secondary | ICD-10-CM | POA: Diagnosis not present

## 2021-08-07 DIAGNOSIS — Z20822 Contact with and (suspected) exposure to covid-19: Secondary | ICD-10-CM | POA: Diagnosis present

## 2021-08-07 DIAGNOSIS — G629 Polyneuropathy, unspecified: Secondary | ICD-10-CM | POA: Diagnosis not present

## 2021-08-07 DIAGNOSIS — E876 Hypokalemia: Secondary | ICD-10-CM | POA: Diagnosis present

## 2021-08-07 DIAGNOSIS — K219 Gastro-esophageal reflux disease without esophagitis: Secondary | ICD-10-CM | POA: Diagnosis present

## 2021-08-07 DIAGNOSIS — E782 Mixed hyperlipidemia: Secondary | ICD-10-CM | POA: Diagnosis present

## 2021-08-07 DIAGNOSIS — Z888 Allergy status to other drugs, medicaments and biological substances status: Secondary | ICD-10-CM | POA: Diagnosis not present

## 2021-08-07 DIAGNOSIS — I872 Venous insufficiency (chronic) (peripheral): Secondary | ICD-10-CM | POA: Diagnosis present

## 2021-08-07 DIAGNOSIS — D72829 Elevated white blood cell count, unspecified: Secondary | ICD-10-CM

## 2021-08-07 DIAGNOSIS — Z79899 Other long term (current) drug therapy: Secondary | ICD-10-CM | POA: Diagnosis not present

## 2021-08-07 DIAGNOSIS — K859 Acute pancreatitis without necrosis or infection, unspecified: Secondary | ICD-10-CM | POA: Diagnosis not present

## 2021-08-07 DIAGNOSIS — K8689 Other specified diseases of pancreas: Secondary | ICD-10-CM | POA: Diagnosis not present

## 2021-08-07 DIAGNOSIS — I25810 Atherosclerosis of coronary artery bypass graft(s) without angina pectoris: Secondary | ICD-10-CM

## 2021-08-07 DIAGNOSIS — R109 Unspecified abdominal pain: Secondary | ICD-10-CM | POA: Diagnosis not present

## 2021-08-07 DIAGNOSIS — R066 Hiccough: Secondary | ICD-10-CM | POA: Diagnosis present

## 2021-08-07 DIAGNOSIS — Z951 Presence of aortocoronary bypass graft: Secondary | ICD-10-CM | POA: Diagnosis not present

## 2021-08-07 DIAGNOSIS — I10 Essential (primary) hypertension: Secondary | ICD-10-CM

## 2021-08-07 DIAGNOSIS — J189 Pneumonia, unspecified organism: Secondary | ICD-10-CM | POA: Diagnosis not present

## 2021-08-07 DIAGNOSIS — K8591 Acute pancreatitis with uninfected necrosis, unspecified: Principal | ICD-10-CM

## 2021-08-07 DIAGNOSIS — N179 Acute kidney failure, unspecified: Secondary | ICD-10-CM | POA: Diagnosis present

## 2021-08-07 DIAGNOSIS — J90 Pleural effusion, not elsewhere classified: Secondary | ICD-10-CM | POA: Diagnosis not present

## 2021-08-07 DIAGNOSIS — K760 Fatty (change of) liver, not elsewhere classified: Secondary | ICD-10-CM | POA: Diagnosis not present

## 2021-08-07 DIAGNOSIS — Z7982 Long term (current) use of aspirin: Secondary | ICD-10-CM | POA: Diagnosis not present

## 2021-08-07 DIAGNOSIS — Z8249 Family history of ischemic heart disease and other diseases of the circulatory system: Secondary | ICD-10-CM | POA: Diagnosis not present

## 2021-08-07 DIAGNOSIS — M549 Dorsalgia, unspecified: Secondary | ICD-10-CM | POA: Diagnosis not present

## 2021-08-07 DIAGNOSIS — K8501 Idiopathic acute pancreatitis with uninfected necrosis: Secondary | ICD-10-CM

## 2021-08-07 DIAGNOSIS — I871 Compression of vein: Secondary | ICD-10-CM | POA: Diagnosis not present

## 2021-08-07 DIAGNOSIS — S2241XA Multiple fractures of ribs, right side, initial encounter for closed fracture: Secondary | ICD-10-CM | POA: Diagnosis not present

## 2021-08-07 LAB — CBC WITH DIFFERENTIAL/PLATELET
Abs Immature Granulocytes: 0.03 10*3/uL (ref 0.00–0.07)
Basophils Absolute: 0.1 10*3/uL (ref 0.0–0.1)
Basophils Relative: 1 %
Eosinophils Absolute: 0.3 10*3/uL (ref 0.0–0.5)
Eosinophils Relative: 3 %
HCT: 48.6 % (ref 39.0–52.0)
Hemoglobin: 16.5 g/dL (ref 13.0–17.0)
Immature Granulocytes: 0 %
Lymphocytes Relative: 16 %
Lymphs Abs: 2 10*3/uL (ref 0.7–4.0)
MCH: 29.2 pg (ref 26.0–34.0)
MCHC: 34 g/dL (ref 30.0–36.0)
MCV: 85.9 fL (ref 80.0–100.0)
Monocytes Absolute: 1.2 10*3/uL — ABNORMAL HIGH (ref 0.1–1.0)
Monocytes Relative: 10 %
Neutro Abs: 8.6 10*3/uL — ABNORMAL HIGH (ref 1.7–7.7)
Neutrophils Relative %: 70 %
Platelets: 242 10*3/uL (ref 150–400)
RBC: 5.66 MIL/uL (ref 4.22–5.81)
RDW: 13.2 % (ref 11.5–15.5)
WBC: 12.1 10*3/uL — ABNORMAL HIGH (ref 4.0–10.5)
nRBC: 0 % (ref 0.0–0.2)

## 2021-08-07 LAB — COMPREHENSIVE METABOLIC PANEL
ALT: 28 U/L (ref 0–44)
AST: 27 U/L (ref 15–41)
Albumin: 4.8 g/dL (ref 3.5–5.0)
Alkaline Phosphatase: 59 U/L (ref 38–126)
Anion gap: 10 (ref 5–15)
BUN: 19 mg/dL (ref 8–23)
CO2: 24 mmol/L (ref 22–32)
Calcium: 9.4 mg/dL (ref 8.9–10.3)
Chloride: 102 mmol/L (ref 98–111)
Creatinine, Ser: 1.01 mg/dL (ref 0.61–1.24)
GFR, Estimated: >60 mL/min (ref >60)
Glucose, Bld: 126 mg/dL — ABNORMAL HIGH (ref 70–99)
Potassium: 3.6 mmol/L (ref 3.5–5.1)
Sodium: 136 mmol/L (ref 135–145)
Total Bilirubin: 1 mg/dL (ref 0.3–1.2)
Total Protein: 8 g/dL (ref 6.5–8.1)

## 2021-08-07 LAB — LIPASE, BLOOD: Lipase: 8989 U/L — ABNORMAL HIGH (ref 11–51)

## 2021-08-07 LAB — LIPID PANEL
Cholesterol: 106 mg/dL (ref 0–200)
HDL: 37 mg/dL — ABNORMAL LOW (ref >40)
LDL Cholesterol: 47 mg/dL (ref 0–99)
Total CHOL/HDL Ratio: 2.9 RATIO
Triglycerides: 110 mg/dL (ref <150)
VLDL: 22 mg/dL (ref 0–40)

## 2021-08-07 LAB — TROPONIN I (HIGH SENSITIVITY): Troponin I (High Sensitivity): <2 ng/L (ref <18)

## 2021-08-07 LAB — RESP PANEL BY RT-PCR (FLU A&B, COVID) ARPGX2
Influenza A by PCR: NEGATIVE
Influenza B by PCR: NEGATIVE
SARS Coronavirus 2 by RT PCR: NEGATIVE

## 2021-08-07 LAB — LACTIC ACID, PLASMA: Lactic Acid, Venous: 4 mmol/L (ref 0.5–1.9)

## 2021-08-07 MED ORDER — LACTATED RINGERS IV BOLUS
1000.0000 mL | Freq: Once | INTRAVENOUS | Status: AC
  Administered 2021-08-07: 1000 mL via INTRAVENOUS

## 2021-08-07 MED ORDER — ACETAMINOPHEN 325 MG PO TABS
650.0000 mg | ORAL_TABLET | Freq: Four times a day (QID) | ORAL | Status: DC | PRN
  Filled 2021-08-07: qty 2

## 2021-08-07 MED ORDER — FENTANYL CITRATE PF 50 MCG/ML IJ SOSY
50.0000 ug | PREFILLED_SYRINGE | Freq: Once | INTRAMUSCULAR | Status: AC
  Administered 2021-08-07: 50 ug via INTRAVENOUS
  Filled 2021-08-07: qty 1

## 2021-08-07 MED ORDER — ONDANSETRON HCL 4 MG/2ML IJ SOLN
4.0000 mg | Freq: Once | INTRAMUSCULAR | Status: AC
  Administered 2021-08-07: 4 mg via INTRAVENOUS
  Filled 2021-08-07: qty 2

## 2021-08-07 MED ORDER — ASPIRIN 81 MG PO CHEW
81.0000 mg | CHEWABLE_TABLET | Freq: Every day | ORAL | Status: DC
  Administered 2021-08-08 – 2021-08-16 (×9): 81 mg via ORAL
  Filled 2021-08-07 (×10): qty 1

## 2021-08-07 MED ORDER — IOHEXOL 350 MG/ML SOLN
80.0000 mL | Freq: Once | INTRAVENOUS | Status: AC | PRN
  Administered 2021-08-07: 80 mL via INTRAVENOUS

## 2021-08-07 MED ORDER — ONDANSETRON HCL 4 MG/2ML IJ SOLN
4.0000 mg | Freq: Four times a day (QID) | INTRAMUSCULAR | Status: DC | PRN
  Administered 2021-08-07: 4 mg via INTRAVENOUS
  Filled 2021-08-07: qty 2

## 2021-08-07 MED ORDER — ENOXAPARIN SODIUM 40 MG/0.4ML IJ SOSY
40.0000 mg | PREFILLED_SYRINGE | INTRAMUSCULAR | Status: DC
  Administered 2021-08-07 – 2021-08-15 (×9): 40 mg via SUBCUTANEOUS
  Filled 2021-08-07 (×9): qty 0.4

## 2021-08-07 MED ORDER — ACETAMINOPHEN 650 MG RE SUPP: 650.0000 mg | Freq: Four times a day (QID) | RECTAL | Status: DC | PRN

## 2021-08-07 MED ORDER — POLYETHYLENE GLYCOL 3350 17 G PO PACK
17.0000 g | PACK | Freq: Every day | ORAL | Status: DC | PRN
  Administered 2021-08-09 – 2021-08-13 (×3): 17 g via ORAL
  Filled 2021-08-07 (×3): qty 1

## 2021-08-07 MED ORDER — LACTATED RINGERS IV SOLN: INTRAVENOUS | Status: AC

## 2021-08-07 MED ORDER — HYDROMORPHONE HCL 1 MG/ML IJ SOLN
0.5000 mg | INTRAMUSCULAR | Status: DC | PRN
  Filled 2021-08-07: qty 0.5

## 2021-08-07 MED ORDER — HYDROMORPHONE HCL 1 MG/ML IJ SOLN
1.0000 mg | Freq: Once | INTRAMUSCULAR | Status: AC
  Administered 2021-08-07: 1 mg via INTRAVENOUS
  Filled 2021-08-07: qty 1

## 2021-08-07 MED ORDER — HYDROMORPHONE HCL 1 MG/ML IJ SOLN
1.0000 mg | INTRAMUSCULAR | Status: DC | PRN
  Administered 2021-08-07 – 2021-08-10 (×15): 1 mg via INTRAVENOUS
  Filled 2021-08-07 (×16): qty 1

## 2021-08-07 MED ORDER — IRBESARTAN 75 MG PO TABS
37.5000 mg | ORAL_TABLET | Freq: Every day | ORAL | Status: DC
  Administered 2021-08-08 – 2021-08-16 (×9): 37.5 mg via ORAL
  Filled 2021-08-07 (×10): qty 1

## 2021-08-07 MED ORDER — METOPROLOL TARTRATE 25 MG PO TABS
12.5000 mg | ORAL_TABLET | Freq: Every morning | ORAL | Status: DC
  Administered 2021-08-08 – 2021-08-16 (×9): 12.5 mg via ORAL
  Filled 2021-08-07 (×9): qty 1

## 2021-08-07 MED ORDER — ADULT MULTIVITAMIN W/MINERALS CH
1.0000 | ORAL_TABLET | Freq: Every day | ORAL | Status: DC
  Administered 2021-08-08: 1 via ORAL
  Filled 2021-08-07: qty 1

## 2021-08-07 NOTE — ED Provider Notes (Signed)
[facility] Provider Note   CSN: [id number] Arrival date & time: 08/07/21  1035     History Chief Complaint  Patient presents with   Abdominal Pain    [name] is a 70 y.o. male.   Abdominal Pain Associated symptoms: nausea and vomiting   Associated symptoms: no chest pain, no fatigue and no shortness of breath   Patient presents abdominal pain.  Began somewhat acutely earlier today.  States it feels as if there is an Nurse, learning disability twisting around his abdomen.  States 2 days ago he did have a near syncopal episode.  No diarrhea.  No vomiting.  States that he has not really had pains like this before.  States he feels bad.  States earlier he was sitting back and his arms tingle but that is resolved.  Pain is somewhat diffuse over his abdomen.    Past Medical History:  Diagnosis Date   Coronary artery disease    a. s/p CABG in 2014 with LIMA-LAD and SVG-D1 - performed in [place]   Fracture, ribs    Heart disease    Hyperlipidemia    Hypertension    Peripheral neuropathy     Patient Active Problem List   Diagnosis Date Noted   Chronic venous insufficiency 05/28/2021   Peripheral neuropathy 12/31/2020   Positive ANA (antinuclear antibody) 12/31/2020   Statin myopathy 12/21/2019   History of colonic polyps 12/11/2016   S/P CABG x 2 07/03/2015   HTN (hypertension) 07/03/2015   Dyslipidemia 07/03/2015    Past Surgical History:  Procedure Laterality Date   CATARACT EXTRACTION W/PHACO Right 02/15/2021   Procedure: CATARACT EXTRACTION PHACO AND INTRAOCULAR LENS PLACEMENT ([place]);  Surgeon: Baruch Goldmann, MD;  Location: [facility];  Service: Ophthalmology;  Laterality: Right;  CDE 8.65   CATARACT EXTRACTION W/PHACO Left 03/04/2021   Procedure: CATARACT EXTRACTION PHACO AND INTRAOCULAR LENS PLACEMENT ([facility]);  Surgeon: Baruch Goldmann, MD;  Location: [facility];  Service: Ophthalmology;  Laterality: Left;  CDE: 8.15   COLONOSCOPY N/A 03/11/2017   Procedure: COLONOSCOPY;   Surgeon: Rogene Houston, MD;  Location: [facility];  Service: Endoscopy;  Laterality: N/A;  [place]   CORONARY ARTERY BYPASS GRAFT  2014   HERNIA REPAIR  2010   POLYPECTOMY  03/11/2017   Procedure: POLYPECTOMY;  Surgeon: Rogene Houston, MD;  Location: [facility];  Service: Endoscopy;;  colon   ROTATOR CUFF REPAIR Right 2014   vericous veins  2001       Family History  Problem Relation Age of Onset   Heart disease Father    Dementia Sister    Thyroid disease Sister    Hypertension Sister    Stroke Mother     Social History   Tobacco Use   Smoking status: Never   Smokeless tobacco: Never  Vaping Use   Vaping Use: Never used  Substance Use Topics   Alcohol use: Yes    Alcohol/week: 0.0 standard drinks    Comment: [place] on the weekends x 4   Drug use: No    Home Medications Prior to Admission medications   Medication Sig Start Date End Date Taking? Authorizing Provider  acetaminophen (TYLENOL) 325 MG tablet Take 650 mg by mouth daily as needed for moderate pain or headache.    [provider]  aspirin 81 MG tablet Take 1 tablet (81 mg total) by mouth daily. 03/12/17   [name], Mechele Dawley, MD  irbesartan (AVAPRO) 75 MG tablet Take 37.5 mg by mouth  daily.    [provider]  metoprolol tartrate (LOPRESSOR) 25 MG tablet Take 12.5 mg by mouth every morning.    [provider]  Multiple Vitamins-Minerals (MULTIVITAMIN WITH MINERALS) tablet Take 1 tablet by mouth daily.    [provider]  pregabalin (LYRICA) 50 MG capsule TAKE ONE CAPSULE ('50MG'$  TOTAL) BY MOUTH TWO TIMES DAILY 08/06/21   Lomax, Amy, NP  REPATHA SURECLICK [id number] MG/ML SOAJ INJECT 1 DOSE INTO THE SKIN EVERY 14 DAYS. Patient taking differently: Inject 140 mg into the muscle every 14 (fourteen) days. 11/05/20   Hilty, Nadean Corwin, MD  sildenafil (REVATIO) 20 MG tablet Take 40-100 mg by mouth daily as needed (erectile dysfunction).    [provider]    Allergies     Gabapentin  Review of Systems   Review of Systems  Constitutional:  Negative for fatigue.  HENT:  Negative for congestion.   Respiratory:  Negative for shortness of breath.   Cardiovascular:  Negative for chest pain.  Gastrointestinal:  Positive for abdominal pain, nausea and vomiting.  Genitourinary:  Negative for flank pain.  Musculoskeletal:  Negative for back pain.  Skin:  Negative for rash.  Neurological:  Positive for light-headedness. Negative for weakness.   Physical Exam Updated Vital Signs BP (!) 147/78 (BP Location: Left Arm)   Pulse 69   Temp 98.5 F (36.9 C) (Oral)   Resp 17   Ht '5\' 8"'$  (1.727 m)   Wt 82.6 kg   SpO2 100%   BMI 27.67 kg/m   Physical Exam Vitals and nursing note reviewed.  HENT:     Head: Atraumatic.  Cardiovascular:     Rate and Rhythm: Normal rate and regular rhythm.  Pulmonary:     Breath sounds: Normal breath sounds.  Abdominal:     Hernia: No hernia is present.     Comments: Somewhat diffuse mild tenderness without rebound or guarding.  No hernia palpated.  Skin:    General: Skin is warm.     Capillary Refill: Capillary refill takes less than 2 seconds.  Neurological:     Mental Status: He is alert and oriented to person, place, and time.    ED Results / Procedures / Treatments   Labs (all labs ordered are listed, but only abnormal results are displayed) Labs Reviewed  COMPREHENSIVE METABOLIC PANEL - Abnormal; Notable for the following components:      Result Value   Glucose, Bld 126 (*)    All other components within normal limits  LIPASE, BLOOD - Abnormal; Notable for the following components:   Lipase 8,989 (*)    All other components within normal limits  CBC WITH DIFFERENTIAL/PLATELET - Abnormal; Notable for the following components:   WBC 12.1 (*)    Neutro Abs 8.6 (*)    Monocytes Absolute 1.2 (*)    All other components within normal limits  RESP PANEL BY RT-PCR (FLU A&B, COVID) [id number]    EKG EKG  Interpretation  Date/Time:  Wednesday August 07 2021 10:51:39 EDT Ventricular Rate:  70 PR Interval:  200 QRS Duration: 92 QT Interval:  383 QTC Calculation: 414 R Axis:   25 Text Interpretation: Sinus rhythm Left atrial enlargement Anterior infarct, age indeterminate Confirmed by Davonna Belling [phone]) on 08/07/2021 12:47:12 PM  Radiology CT ABDOMEN PELVIS W CONTRAST  Result Date: 08/07/2021 CLINICAL DATA:  Left lower quadrant abdominal pain EXAM: CT ABDOMEN AND PELVIS WITH CONTRAST TECHNIQUE: Multidetector CT imaging of the abdomen and pelvis was performed using the  standard protocol following bolus administration of intravenous contrast. CONTRAST:  3m OMNIPAQUE IOHEXOL 350 MG/ML SOLN COMPARISON:  None. FINDINGS: Lower chest: Small hiatal hernia.  No acute abnormality. Hepatobiliary: No focal liver abnormality is seen. No gallstones, gallbladder wall thickening, or biliary dilatation. Pancreas: Inflammatory change and trace fluid seen about the pancreas. Mild gland hypoenhancement of the neck/body. Small volume fluid tracking into the right greater than left paracolic gutters. Spleen: Normal in size without focal abnormality. Adrenals/Urinary Tract: Adrenal glands are unremarkable. Kidneys are normal, without renal calculi, focal lesion, or hydronephrosis. Bladder is unremarkable. Stomach/Bowel: Stomach is within normal limits. Appendix appears normal. No evidence of bowel wall thickening, distention, or inflammatory changes. Vascular/Lymphatic: No significant vascular findings are present. No enlarged abdominal or pelvic lymph nodes. Reproductive: Prostatomegaly, measuring up to 5.2 cm. Other: No abdominal wall hernia or abnormality. No abdominopelvic ascites. Musculoskeletal: No acute or significant osseous findings. IMPRESSION: Findings compatible with acute pancreatitis.Mild gland hypoenhancement of the neck/body of the pancreas, concerning for necrosis. Electronically Signed   By: LYetta GlassmanM.D.   On: 08/07/2021 14:32   DG Abdomen Acute W/Chest  Result Date: 08/07/2021 CLINICAL DATA:  Abdominal pain EXAM: DG ABDOMEN ACUTE WITH 1 VIEW CHEST COMPARISON:  None. FINDINGS: Chest: The cardiomediastinal silhouette is within normal limits. There is no focal consolidation or pulmonary edema. There is no pleural effusion or pneumothorax. Median sternotomy wires are noted. Abdomen: There is a paucity of bowel gas with no evidence of mechanical obstruction. There is no evidence of free intraperitoneal air. There is no abnormal soft tissue calcification. Suture anchors are noted in the right humeral head. There is a mildly displaced fracture of the right lateral ninth rib. IMPRESSION: 1. Paucity of bowel gas with no evidence of mechanical obstruction. 2. Age-indeterminate mildly displaced fracture of the right lateral ninth rib. Correlate with point tenderness. Electronically Signed   By: PValetta MoleM.D.   On: 08/07/2021 12:45    Procedures Procedures   Medications Ordered in ED Medications  fentaNYL (SUBLIMAZE) injection 50 mcg (50 mcg Intravenous Given 08/07/21 1148)  ondansetron (ZOFRAN) injection 4 mg (4 mg Intravenous Given 08/07/21 1145)  HYDROmorphone (DILAUDID) injection 1 mg (1 mg Intravenous Given 08/07/21 1407)  ondansetron (ZOFRAN) injection 4 mg (4 mg Intravenous Given 08/07/21 1404)  iohexol (OMNIPAQUE) 350 MG/ML injection 80 mL (80 mLs Intravenous Contrast Given 08/07/21 1359)    ED Course  I have reviewed the triage vital signs and the nursing notes.  Pertinent labs & imaging results that were available during my care of the patient were reviewed by me and considered in my medical decision making (see chart for details).    MDM Rules/Calculators/A&P                           Patient with abdominal pain.  Began somewhat acutely but has been feeling a little off for a day or 2.  Vomiting and nausea pain.  Requiring narcotic pain medicines.  Found to have a lipase of  about 9000.  CT scan shows pancreatitis.  With continued pain will admit to hospital. Final Clinical Impression(s) / ED Diagnoses Final diagnoses:  Acute pancreatitis, unspecified complication status, unspecified pancreatitis type    Rx / DC Orders ED Discharge Orders     None        PDavonna Belling MD 08/07/21 1510

## 2021-08-07 NOTE — H&P (Signed)
History and Physical    Alyx Muskett [id number] DOB: 02-Aug-1951 DOA: 08/07/2021  PCP: Asencion Noble, MD  Patient coming from: Home   Chief Complaint:  Chief Complaint  Patient presents with   Abdominal Pain     HPI:    70 year old male with past medical history of coronary artery disease (S/P CABG 2014), hypertension, peripheral neuropathy (follows with [facility]), hyperlipidemia who presents to [facility] emergency department with complaints of nausea vomiting and abdominal pain.  Patient explains that on Monday he began to experience a general sense of malaise and intermittent lightheadedness.  The symptoms persisted over the following 48 hours.  On Wednesday morning the patient proceeded to mow the lawn and shortly after beginning to mow the lawn he began to feel worsening lightheadedness and tingling of the bilateral upper extremities.  Patient also began to exhibit episodes of loose stool.  Patient decided to present to [facility] emergency room for evaluation due to the symptoms but upon arrival patient began to experience rapidly worsening nausea and vomited several times while in the waiting room.  This was followed by rather rapid onset of severe epigastric pain.  Pain is dull in quality, severe in intensity and nonradiating.  Pain is worse with movement without any alleviating factors.  Patient denies any associated fevers, dysuria, low back pain.  Patient denies any sick contacts, recent travel, recent ingestion of undercooked food or recent confirmed contact with COVID-19 infection.  Patient denies heavy alcohol use, drinking 1-2 beers once or twice weekly.  Patient denies being placed on any new prescription medications with exception of being initiated on Lyrica several months ago by his neurologist for peripheral neuropathy.  Patient has no history of gallbladder disease and denies having a pattern of epigastric pain after eating fatty meals.  Patient  denies any illicit drug use.  Upon evaluation in the emergency department CT imaging of the abdomen pelvis revealed evidence of acute pancreatitis with mild gland hypoenhancement of the neck and body of the pancreas concerning for necrosis.  Patient was found to have a lipase of 8989.  Patient was found to have a mild leukocytosis of 12.1.  Patient was initiated on intravenous antiemetics and analgesics with intravenous fentanyl followed by Dilaudid as well as intravenous Zofran.  The hospitalist group was then called to assess the patient for admission to the hospital.  Review of Systems:   Review of Systems  Gastrointestinal:  Positive for abdominal pain, diarrhea, nausea and vomiting.  Neurological:  Positive for tingling.  All other systems reviewed and are negative.  Past Medical History:  Diagnosis Date   Coronary artery disease    a. s/p CABG in 2014 with LIMA-LAD and SVG-D1 - performed in [place]   Fracture, ribs    Heart disease    Hyperlipidemia    Hypertension    Peripheral neuropathy     Past Surgical History:  Procedure Laterality Date   CATARACT EXTRACTION W/PHACO Right 02/15/2021   Procedure: CATARACT EXTRACTION PHACO AND INTRAOCULAR LENS PLACEMENT ([place]);  Surgeon: Baruch Goldmann, MD;  Location: [facility];  Service: Ophthalmology;  Laterality: Right;  CDE 8.65   CATARACT EXTRACTION W/PHACO Left 03/04/2021   Procedure: CATARACT EXTRACTION PHACO AND INTRAOCULAR LENS PLACEMENT ([facility]);  Surgeon: Baruch Goldmann, MD;  Location: [facility];  Service: Ophthalmology;  Laterality: Left;  CDE: 8.15   COLONOSCOPY N/A 03/11/2017   Procedure: COLONOSCOPY;  Surgeon: Rogene Houston, MD;  Location: [facility];  Service: Endoscopy;  Laterality:  N/A;  930   CORONARY ARTERY BYPASS GRAFT  2014   HERNIA REPAIR  2010   POLYPECTOMY  03/11/2017   Procedure: POLYPECTOMY;  Surgeon: Rogene Houston, MD;  Location: [facility];  Service: Endoscopy;;  colon   ROTATOR CUFF REPAIR Right 2014   vericous veins   2001     reports that he has never smoked. He has never used smokeless tobacco. He reports current alcohol use. He reports that he does not use drugs.  Allergies  Allergen Reactions   Gabapentin Other (See Comments)    fatigue    Family History  Problem Relation Age of Onset   Heart disease Father    Dementia Sister    Thyroid disease Sister    Hypertension Sister    Stroke Mother      Prior to Admission medications   Medication Sig Start Date End Date Taking? Authorizing Provider  acetaminophen (TYLENOL) 325 MG tablet Take 650 mg by mouth daily as needed for moderate pain or headache.   Yes [provider]  aspirin 81 MG tablet Take 1 tablet (81 mg total) by mouth daily. 03/12/17  Yes [name], Mechele Dawley, MD  irbesartan (AVAPRO) 75 MG tablet Take 37.5 mg by mouth daily.   Yes [provider]  metoprolol tartrate (LOPRESSOR) 25 MG tablet Take 12.5 mg by mouth every morning.   Yes [provider]  Multiple Vitamins-Minerals (MULTIVITAMIN WITH MINERALS) tablet Take 1 tablet by mouth daily.   Yes [provider]  pregabalin (LYRICA) 50 MG capsule TAKE ONE CAPSULE ('50MG'$  TOTAL) BY MOUTH TWO TIMES DAILY Patient taking differently: Take 50 mg by mouth 2 (two) times daily. 08/06/21  Yes Lomax, Amy, NP  REPATHA SURECLICK [id number] MG/ML SOAJ INJECT 1 DOSE INTO THE SKIN EVERY 14 DAYS. Patient taking differently: Inject 140 mg into the muscle every 14 (fourteen) days. 11/05/20  Yes Hilty, Nadean Corwin, MD  sildenafil (REVATIO) 20 MG tablet Take 40-100 mg by mouth daily as needed (erectile dysfunction).   Yes [provider]  valACYclovir (VALTREX) 1000 MG tablet Take 1,000 mg by mouth daily. 04/22/21  Yes [provider]    Physical Exam: Vitals:   08/07/21 1300 08/07/21 1409 08/07/21 1500 08/07/21 1559  BP: 109/60 (!) 147/78 134/81 (!) 147/77  Pulse: 64 69 69 75  Resp: '12 17 18 15  '$ Temp:      TempSrc:      SpO2: 97% 100% 100% 100%  Weight:       Height:        Constitutional: Awake alert and oriented x3, patient is notably in distress due to epigastric pain. Skin: no rashes, no lesions, somewhat poor skin turgor noted. Eyes: Pupils are equally reactive to light.  No evidence of scleral icterus or conjunctival pallor.  ENMT: Dry mucous membranes noted.  Posterior pharynx clear of any exudate or lesions.   Neck: normal, supple, no masses, no thyromegaly.  No evidence of jugular venous distension.   Respiratory: clear to auscultation bilaterally, no wheezing, no crackles. Normal respiratory effort. No accessory muscle use.  Cardiovascular: Regular rate and rhythm, no murmurs / rubs / gallops. No extremity edema. 2+ pedal pulses. No carotid bruits.  Chest:   Nontender without crepitus or deformity.   Back:   Nontender without crepitus or deformity. Abdomen: Severe epigastric tenderness.  Abdomen is soft however.  No evidence of intra-abdominal masses.  Positive bowel sounds noted in all quadrants.   Musculoskeletal: No joint deformity upper and  lower extremities. Good ROM, no contractures. Normal muscle tone.  Neurologic: CN 2-12 grossly intact. Sensation intact.  Patient moving all 4 extremities spontaneously.  Patient is following all commands.  Patient is responsive to verbal stimuli.   Psychiatric: Patient exhibits anxious mood with appropriate affect.  Patient seems to possess insight as to their current situation.     Labs on Admission: I have personally reviewed following labs and imaging studies -   CBC: Recent Labs  Lab 08/07/21 1130  WBC 12.1*  NEUTROABS 8.6*  HGB 16.5  HCT 48.6  MCV 85.9  PLT [id number]   Basic Metabolic Panel: Recent Labs  Lab 08/07/21 1130  NA 136  K 3.6  CL 102  CO2 24  GLUCOSE 126*  BUN 19  CREATININE 1.01  CALCIUM 9.4   GFR: Estimated Creatinine Clearance: 69.3 mL/min (by C-G formula based on SCr of 1.01 mg/dL). Liver Function Tests: Recent Labs  Lab 08/07/21 1130  AST 27  ALT 28   ALKPHOS 59  BILITOT 1.0  PROT 8.0  ALBUMIN 4.8   Recent Labs  Lab 08/07/21 1130  LIPASE 8,989*   No results for input(s): AMMONIA in the last 168 hours. Coagulation Profile: No results for input(s): INR, PROTIME in the last 168 hours. Cardiac Enzymes: No results for input(s): CKTOTAL, CKMB, CKMBINDEX, TROPONINI in the last 168 hours. BNP (last 3 results) No results for input(s): PROBNP in the last 8760 hours. HbA1C: No results for input(s): HGBA1C in the last 72 hours. CBG: No results for input(s): GLUCAP in the last 168 hours. Lipid Profile: No results for input(s): CHOL, HDL, LDLCALC, TRIG, CHOLHDL, LDLDIRECT in the last 72 hours. Thyroid Function Tests: No results for input(s): TSH, T4TOTAL, FREET4, T3FREE, THYROIDAB in the last 72 hours. Anemia Panel: No results for input(s): VITAMINB12, FOLATE, FERRITIN, TIBC, IRON, RETICCTPCT in the last 72 hours. Urine analysis: No results found for: COLORURINE, APPEARANCEUR, LABSPEC, PHURINE, GLUCOSEU, HGBUR, BILIRUBINUR, KETONESUR, PROTEINUR, UROBILINOGEN, NITRITE, LEUKOCYTESUR  Radiological Exams on Admission - Personally Reviewed: CT ABDOMEN PELVIS W CONTRAST  Result Date: 08/07/2021 CLINICAL DATA:  Left lower quadrant abdominal pain EXAM: CT ABDOMEN AND PELVIS WITH CONTRAST TECHNIQUE: Multidetector CT imaging of the abdomen and pelvis was performed using the standard protocol following bolus administration of intravenous contrast. CONTRAST:  23m OMNIPAQUE IOHEXOL 350 MG/ML SOLN COMPARISON:  None. FINDINGS: Lower chest: Small hiatal hernia.  No acute abnormality. Hepatobiliary: No focal liver abnormality is seen. No gallstones, gallbladder wall thickening, or biliary dilatation. Pancreas: Inflammatory change and trace fluid seen about the pancreas. Mild gland hypoenhancement of the neck/body. Small volume fluid tracking into the right greater than left paracolic gutters. Spleen: Normal in size without focal abnormality. Adrenals/Urinary  Tract: Adrenal glands are unremarkable. Kidneys are normal, without renal calculi, focal lesion, or hydronephrosis. Bladder is unremarkable. Stomach/Bowel: Stomach is within normal limits. Appendix appears normal. No evidence of bowel wall thickening, distention, or inflammatory changes. Vascular/Lymphatic: No significant vascular findings are present. No enlarged abdominal or pelvic lymph nodes. Reproductive: Prostatomegaly, measuring up to 5.2 cm. Other: No abdominal wall hernia or abnormality. No abdominopelvic ascites. Musculoskeletal: No acute or significant osseous findings. IMPRESSION: Findings compatible with acute pancreatitis.Mild gland hypoenhancement of the neck/body of the pancreas, concerning for necrosis. Electronically Signed   By: LYetta GlassmanM.D.   On: 08/07/2021 14:32   DG Abdomen Acute W/Chest  Result Date: 08/07/2021 CLINICAL DATA:  Abdominal pain EXAM: DG ABDOMEN ACUTE WITH 1 VIEW CHEST COMPARISON:  None. FINDINGS: Chest: The cardiomediastinal  silhouette is within normal limits. There is no focal consolidation or pulmonary edema. There is no pleural effusion or pneumothorax. Median sternotomy wires are noted. Abdomen: There is a paucity of bowel gas with no evidence of mechanical obstruction. There is no evidence of free intraperitoneal air. There is no abnormal soft tissue calcification. Suture anchors are noted in the right humeral head. There is a mildly displaced fracture of the right lateral ninth rib. IMPRESSION: 1. Paucity of bowel gas with no evidence of mechanical obstruction. 2. Age-indeterminate mildly displaced fracture of the right lateral ninth rib. Correlate with point tenderness. Electronically Signed   By: Valetta Mole M.D.   On: 08/07/2021 12:45    EKG: Personally reviewed.  Rhythm is normal sinus rhythm with heart rate of 70 bpm.  No dynamic ST segment changes appreciated.  Assessment/Plan Principal Problem:   Acute pancreatitis with uninfected  necrosis  Etiology unclear Obtaining urine toxicology screen, right upper quad ultrasound (no evidence of biliary disease on CT abdomen), lipid panel Patient reports being placed on Lyrica several months ago.  Lyrica can rarely cause pancreatitis but I feel it is unlikely that this is the culprit.  We will hold this medication for now. Keeping patient n.p.o. for now Hydrating patient aggressively with intravenous isotonic fluids As needed intravenous antiemetics and intravenous opiate-based analgesics While there is evidence of necrosis on CT imaging there is really no clinical evidence of concurrent infection despite mild leukocytosis and therefore antibiotics are not indicated To evaluate for any evidence of underlying infectious process obtaining blood cultures, urinalysis, lactic acid Will advance diet once pain is improved.  Active Problems:   Essential hypertension  Continue home regimen of antihypertensives while monitoring patient closely for any episodic hypotension considering relative hypovolemia    Mixed hyperlipidemia  Patient typically on Repatha in the outpatient setting    Coronary artery disease involving native heart without angina pectoris  Patient is currently chest pain-free EKG reveals no dynamic ST segment changes Obtaining troponin in case patient's frequent bouts of vomiting are an ischemic equivalent. Continue home regimen of beta-blocker, aspirin therapy Monitoring patient on telemetry   Code Status:  Full code Family Communication: deferred   Status is: Inpatient  Remains inpatient appropriate because:Ongoing diagnostic testing needed not appropriate for outpatient work up, IV treatments appropriate due to intensity of illness or inability to take PO, and Inpatient level of care appropriate due to severity of illness  Dispo: The patient is from: Home              Anticipated d/c is to: Home              Patient currently is not medically stable to  d/c.   Difficult to place patient No        Vernelle Emerald MD [facility] Pager [phone]  If 7PM-7AM, please contact night-coverage [email or web address] Use universal [place] password for that web site. If you do not have the password, please call the hospital operator.  08/07/2021, 5:57 PM

## 2021-08-07 NOTE — ED Triage Notes (Signed)
Pt c/o abd pain, n/v starting earlier about 1 hour ago. Pt states earlier arms were tingling but quickly resolved.

## 2021-08-08 DIAGNOSIS — K8501 Idiopathic acute pancreatitis with uninfected necrosis: Secondary | ICD-10-CM | POA: Diagnosis not present

## 2021-08-08 DIAGNOSIS — I10 Essential (primary) hypertension: Secondary | ICD-10-CM | POA: Diagnosis not present

## 2021-08-08 DIAGNOSIS — I25810 Atherosclerosis of coronary artery bypass graft(s) without angina pectoris: Secondary | ICD-10-CM | POA: Diagnosis not present

## 2021-08-08 DIAGNOSIS — E782 Mixed hyperlipidemia: Secondary | ICD-10-CM | POA: Diagnosis not present

## 2021-08-08 LAB — CBC WITH DIFFERENTIAL/PLATELET
Abs Immature Granulocytes: 0.15 10*3/uL — ABNORMAL HIGH (ref 0.00–0.07)
Basophils Absolute: 0.1 10*3/uL (ref 0.0–0.1)
Basophils Relative: 0 %
Eosinophils Absolute: 0 10*3/uL (ref 0.0–0.5)
Eosinophils Relative: 0 %
HCT: 51.4 % (ref 39.0–52.0)
Hemoglobin: 17.1 g/dL — ABNORMAL HIGH (ref 13.0–17.0)
Immature Granulocytes: 1 %
Lymphocytes Relative: 6 %
Lymphs Abs: 1.2 10*3/uL (ref 0.7–4.0)
MCH: 28.9 pg (ref 26.0–34.0)
MCHC: 33.3 g/dL (ref 30.0–36.0)
MCV: 87 fL (ref 80.0–100.0)
Monocytes Absolute: 1.2 10*3/uL — ABNORMAL HIGH (ref 0.1–1.0)
Monocytes Relative: 6 %
Neutro Abs: 17.8 10*3/uL — ABNORMAL HIGH (ref 1.7–7.7)
Neutrophils Relative %: 87 %
Platelets: 194 10*3/uL (ref 150–400)
RBC: 5.91 MIL/uL — ABNORMAL HIGH (ref 4.22–5.81)
RDW: 13.6 % (ref 11.5–15.5)
WBC: 20.3 10*3/uL — ABNORMAL HIGH (ref 4.0–10.5)
nRBC: 0 % (ref 0.0–0.2)

## 2021-08-08 LAB — COMPREHENSIVE METABOLIC PANEL
ALT: 23 U/L (ref 0–44)
ALT: 22 U/L (ref 0–44)
AST: 39 U/L (ref 15–41)
AST: 43 U/L — ABNORMAL HIGH (ref 15–41)
Albumin: 4.1 g/dL (ref 3.5–5.0)
Albumin: 3.9 g/dL (ref 3.5–5.0)
Alkaline Phosphatase: 58 U/L (ref 38–126)
Alkaline Phosphatase: 54 U/L (ref 38–126)
Anion gap: 10 (ref 5–15)
Anion gap: 10 (ref 5–15)
BUN: 25 mg/dL — ABNORMAL HIGH (ref 8–23)
BUN: 24 mg/dL — ABNORMAL HIGH (ref 8–23)
CO2: 27 mmol/L (ref 22–32)
CO2: 25 mmol/L (ref 22–32)
Calcium: 8.8 mg/dL — ABNORMAL LOW (ref 8.9–10.3)
Calcium: 7.8 mg/dL — ABNORMAL LOW (ref 8.9–10.3)
Chloride: 101 mmol/L (ref 98–111)
Chloride: 101 mmol/L (ref 98–111)
Creatinine, Ser: 1.37 mg/dL — ABNORMAL HIGH (ref 0.61–1.24)
Creatinine, Ser: 1.05 mg/dL (ref 0.61–1.24)
GFR, Estimated: 55 mL/min — ABNORMAL LOW (ref >60)
GFR, Estimated: >60 mL/min (ref >60)
Glucose, Bld: 161 mg/dL — ABNORMAL HIGH (ref 70–99)
Glucose, Bld: 107 mg/dL — ABNORMAL HIGH (ref 70–99)
Potassium: 4.6 mmol/L (ref 3.5–5.1)
Potassium: 3.7 mmol/L (ref 3.5–5.1)
Sodium: 138 mmol/L (ref 135–145)
Sodium: 136 mmol/L (ref 135–145)
Total Bilirubin: 1.6 mg/dL — ABNORMAL HIGH (ref 0.3–1.2)
Total Bilirubin: 2 mg/dL — ABNORMAL HIGH (ref 0.3–1.2)
Total Protein: 7.1 g/dL (ref 6.5–8.1)
Total Protein: 6.7 g/dL (ref 6.5–8.1)

## 2021-08-08 LAB — MAGNESIUM: Magnesium: 1.7 mg/dL (ref 1.7–2.4)

## 2021-08-08 LAB — CBC
HCT: 48.4 % (ref 39.0–52.0)
Hemoglobin: 16.1 g/dL (ref 13.0–17.0)
MCH: 29.7 pg (ref 26.0–34.0)
MCHC: 33.3 g/dL (ref 30.0–36.0)
MCV: 89.1 fL (ref 80.0–100.0)
Platelets: 140 10*3/uL — ABNORMAL LOW (ref 150–400)
RBC: 5.43 MIL/uL (ref 4.22–5.81)
RDW: 14.1 % (ref 11.5–15.5)
WBC: 20.7 10*3/uL — ABNORMAL HIGH (ref 4.0–10.5)
nRBC: 0 % (ref 0.0–0.2)

## 2021-08-08 LAB — C-REACTIVE PROTEIN: CRP: 26.2 mg/dL — ABNORMAL HIGH (ref <1.0)

## 2021-08-08 MED ORDER — LACTATED RINGERS IV SOLN: INTRAVENOUS | Status: DC

## 2021-08-08 MED ORDER — KETOROLAC TROMETHAMINE 30 MG/ML IJ SOLN
30.0000 mg | Freq: Once | INTRAMUSCULAR | Status: AC
  Administered 2021-08-08: 30 mg via INTRAVENOUS
  Filled 2021-08-08: qty 1

## 2021-08-08 MED ORDER — SENNOSIDES-DOCUSATE SODIUM 8.6-50 MG PO TABS
1.0000 | ORAL_TABLET | Freq: Every day | ORAL | Status: DC
  Administered 2021-08-08 – 2021-08-10 (×3): 1 via ORAL
  Filled 2021-08-08 (×3): qty 1

## 2021-08-08 NOTE — Progress Notes (Signed)
Ronalee Belts got a fever this afternoon. In concert with rising leukocytosis and overall slight worsening of labs (bilirubin, Cr, BUN), and his significant tenderness on exam, I went to check on him this evening. He states he feels fine, feels better, in fact. Says he was told he had a fever, didn't know it before that. No dyspnea, cough, chest pain, dysuria, hematuria, wounds, or diarrhea. No vomiting.   BP 136/77  Pulse 93 bpm (has been steadily increasing from ~70's at admission)  Temp 101.3 F (oral)   Resp 20/min  SpO2 96%  Nontoxic, resting quietly Nonlabored, clear RRR, rate hovering in high 90's, no new murmur. No new edema.  Abdomen remains somewhat distended but not taut, tender with guarding but no rebound, +BS. This is overall stable from earlier today. No suprapubic tenderness.   A/P: 70yo M w/severe pancreatitis with necrosis. Imaging did not reveal pseudocyst or walled off necrosis, so infection there is less likely. No alternative infectious nidus noted either. Patient reports stable-to-improved pain overall but has nonspecific findings suggesting we are early in the course of illness (including rising HR, fever, worsening leukocytosis).  - We will recheck labs to trend leukocytosis, LFTs, renal function. Check CRP and blood cultures.  - Given current nontoxic appearance and subjective improvement, will not start empiric antibiotics at this time, but I have discussed my low threshold to start pending culture data. The patient consent to abx were he to clinically worsen. - Continue IVF, will adjust rate based on labs. He is not currently overloaded.  Vance Gather, MD 08/08/2021 6:45 PM

## 2021-08-08 NOTE — Progress Notes (Addendum)
PROGRESS NOTE  [name]  [id number] DOB: 05/09/1951 DOA: 08/07/2021 PCP: Asencion Noble, MD   Brief Narrative: [name] is a 70 y.o. male with a history of CAD s/p CABG 2014, HTN, HLD on evolocumab, peripheral neuropathy who presented to the ED 8/17 with complaints of malaise, fatigue for a couple days and began having severe abdominal pain and several episodes of vomiting in the ED waiting room. He was afebrile with stable vital signs. Labs revealed leukocytosis (12.1k) and lipase elevation (8,989) with normal LFTs. CT abd/pelvis confirmed acute pancreatic inflammation with areas of hypoenhancement consistent with necrosis of the neck and body of the pancreas.   Assessment & Plan: Principal Problem:   Acute pancreatitis with uninfected necrosis Active Problems:   Essential hypertension   Mixed hyperlipidemia   Coronary artery disease involving native heart without angina pectoris  Acute pancreatitis with necrosis: Initial episode of pancreatitis with hypoenhancement on CT but no fluid collections at this time. No biliary cause noted on CT or labs, triglycerides not elevated.  - Supportive measures including IV fluids, NPO, IV analgesics and IV antiemetics will be provided. - EtOH cessation discussed. Drinking ~6 drinks per week on average.  - Will hold lyrica and evolocumab for now as these have been rarely associated with pancreatitis.  - Monitor for evidence of infection off abx for now. Check and trend CRP. WBC rising 12 > 20 today. Now having fever. Will check labs this PM (also having rising creatinine and bilirubin) in addition to blood cultures.  CAD s/p CABG 2014, HLD, HTN: No anginal complaints.  - Continue aspirin, metoprolol, irbesartan once able to take po.  - Holding repatha as above. LDL is 47.  Peripheral neuropathy: Followed by [facility]. - Will hold lyrica for now, though this is unlikely to be culprit at this time.   Constipation:  - Will start  scheduled and prn laxatives  DVT prophylaxis: Lovenox Code Status: Full Family Communication: Wife at bedside, Son by speaker phone Disposition Plan:  Status is: Inpatient  Remains inpatient appropriate because:Ongoing active pain requiring inpatient pain management and IV treatments appropriate due to intensity of illness or inability to take PO  Dispo: The patient is from: Home              Anticipated d/c is to: Home              Patient currently is not medically stable to d/c.   Difficult to place patient No  Consultants:  None  Procedures:  None  Antimicrobials: None   Subjective: Pain is subjectively better controlled, though he's having trouble resting and the dose of pain medication wears off after a couple hours.  Objective: Vitals:   08/07/21 1846 08/07/21 2245 08/08/21 0458 08/08/21 1038  BP: (!) 153/78 (!) 150/75 (!) 148/72 (!) 161/86  Pulse: 80 85 90 (!) 109  Resp: '18 18 18   '$ Temp: 98.3 F (36.8 C) 98.6 F (37 C) 98.4 F (36.9 C)   TempSrc: Oral     SpO2: 100% 100% 100%   Weight:      Height:        Intake/Output Summary (Last 24 hours) at 08/08/2021 1804 Last data filed at 08/08/2021 1310 Gross per 24 hour  Intake --  Output 1100 ml  Net -1100 ml   Filed Weights   08/07/21 1044  Weight: 82.6 kg   Gen: 70 y.o. male in no distress Pulm: Non-labored breathing room air. Clear to auscultation bilaterally.  CV:  Regular rate and rhythm. No murmur, rub, or gallop. No JVD, very minimal pedal edema. GI: Abdomen distended with voluntary guarding, and tendernerss worst in epigastrium, +BS.  Ext: Warm, no deformities Skin: No rashes, lesions or ulcers Neuro: Alert and oriented. No focal neurological deficits. Psych: Judgement and insight appear normal. Mood & affect appropriate.   Data Reviewed: I have personally reviewed following labs and imaging studies  CBC: Recent Labs  Lab 08/07/21 1130 08/08/21 0727  WBC 12.1* 20.3*  NEUTROABS 8.6* 17.8*   HGB 16.5 17.1*  HCT 48.6 51.4  MCV 85.9 87.0  PLT 242 [id number]   Basic Metabolic Panel: Recent Labs  Lab 08/07/21 1130 08/08/21 0727  NA 136 138  K 3.6 4.6  CL 102 101  CO2 24 27  GLUCOSE 126* 161*  BUN 19 25*  CREATININE 1.01 1.37*  CALCIUM 9.4 8.8*  MG  --  1.7   GFR: Estimated Creatinine Clearance: 51.1 mL/min (A) (by C-G formula based on SCr of 1.37 mg/dL (H)). Liver Function Tests: Recent Labs  Lab 08/07/21 1130 08/08/21 0727  AST 27 39  ALT 28 23  ALKPHOS 59 58  BILITOT 1.0 1.6*  PROT 8.0 7.1  ALBUMIN 4.8 4.1   Recent Labs  Lab 08/07/21 1130  LIPASE 8,989*   No results for input(s): AMMONIA in the last 168 hours. Coagulation Profile: No results for input(s): INR, PROTIME in the last 168 hours. Cardiac Enzymes: No results for input(s): CKTOTAL, CKMB, CKMBINDEX, TROPONINI in the last 168 hours. BNP (last 3 results) No results for input(s): PROBNP in the last 8760 hours. HbA1C: No results for input(s): HGBA1C in the last 72 hours. CBG: No results for input(s): GLUCAP in the last 168 hours. Lipid Profile: Recent Labs    08/07/21 1744  CHOL 106  HDL 37*  LDLCALC 47  TRIG 110  CHOLHDL 2.9   Thyroid Function Tests: No results for input(s): TSH, T4TOTAL, FREET4, T3FREE, THYROIDAB in the last 72 hours. Anemia Panel: No results for input(s): VITAMINB12, FOLATE, FERRITIN, TIBC, IRON, RETICCTPCT in the last 72 hours. Urine analysis: No results found for: COLORURINE, APPEARANCEUR, LABSPEC, PHURINE, GLUCOSEU, HGBUR, BILIRUBINUR, KETONESUR, PROTEINUR, UROBILINOGEN, NITRITE, LEUKOCYTESUR Recent Results (from the past 240 hour(s))  Resp Panel by RT-PCR (Flu A&B, Covid) Nasopharyngeal Swab     Status: None   Collection Time: 08/07/21  2:14 PM   Specimen: Nasopharyngeal Swab; Nasopharyngeal(NP) swabs in vial transport medium  Result Value Ref Range Status   SARS Coronavirus 2 by RT PCR NEGATIVE NEGATIVE Final    Comment: (NOTE) SARS-CoV-2 target nucleic acids  are NOT DETECTED.  The SARS-CoV-2 RNA is generally detectable in upper respiratory specimens during the acute phase of infection. The lowest concentration of SARS-CoV-2 viral copies this assay can detect is 138 copies/mL. A negative result does not preclude SARS-Cov-2 infection and should not be used as the sole basis for treatment or other patient management decisions. A negative result may occur with  improper specimen collection/handling, submission of specimen other than nasopharyngeal swab, presence of viral mutation(s) within the areas targeted by this assay, and inadequate number of viral copies(<138 copies/mL). A negative result must be combined with clinical observations, patient history, and epidemiological information. The expected result is Negative.  Fact Sheet for Patients:  EntrepreneurPulse.com.au  Fact Sheet for Healthcare Providers:  IncredibleEmployment.be  This test is no t yet approved or cleared by the [place] FDA and  has been authorized for detection and/or diagnosis of SARS-CoV-2 by FDA under an  Emergency Use Authorization (EUA). This EUA will remain  in effect (meaning this test can be used) for the duration of the COVID-19 declaration under Section 564(b)(1) of the Act, 21 U.S.C.section 360bbb-3(b)(1), unless the authorization is terminated  or revoked sooner.       Influenza A by PCR NEGATIVE NEGATIVE Final   Influenza B by PCR NEGATIVE NEGATIVE Final    Comment: (NOTE) The Xpert Xpress SARS-CoV-2/FLU/RSV plus assay is intended as an aid in the diagnosis of influenza from Nasopharyngeal swab specimens and should not be used as a sole basis for treatment. Nasal washings and aspirates are unacceptable for Xpert Xpress SARS-CoV-2/FLU/RSV testing.  Fact Sheet for Patients: EntrepreneurPulse.com.au  Fact Sheet for Healthcare Providers: IncredibleEmployment.be  This test is  not yet approved or cleared by the [place] FDA and has been authorized for detection and/or diagnosis of SARS-CoV-2 by FDA under an Emergency Use Authorization (EUA). This EUA will remain in effect (meaning this test can be used) for the duration of the COVID-19 declaration under Section 564(b)(1) of the Act, 21 U.S.C. section 360bbb-3(b)(1), unless the authorization is terminated or revoked.  Performed at [facility], [address], [place], [place] [address]   Culture, blood (routine x 2)     Status: None (Preliminary result)   Collection Time: 08/07/21  5:44 PM   Specimen: BLOOD LEFT HAND  Result Value Ref Range Status   Specimen Description BLOOD LEFT HAND  Final   Special Requests   Final    BOTTLES DRAWN AEROBIC AND ANAEROBIC Blood Culture adequate volume   Culture   Final    NO GROWTH < 24 HOURS Performed at [facility], [address] [place], [place] [address]    Report Status PENDING  Incomplete  Culture, blood (routine x 2)     Status: None (Preliminary result)   Collection Time: 08/07/21  5:44 PM   Specimen: BLOOD RIGHT HAND  Result Value Ref Range Status   Specimen Description BLOOD RIGHT HAND  Final   Special Requests   Final    BOTTLES DRAWN AEROBIC AND ANAEROBIC Blood Culture adequate volume   Culture   Final    NO GROWTH < 24 HOURS Performed at [facility], [address] [place], [place] [address]    Report Status PENDING  Incomplete      Radiology Studies: CT ABDOMEN PELVIS W CONTRAST  Result Date: 08/07/2021 CLINICAL DATA:  Left lower quadrant abdominal pain EXAM: CT ABDOMEN AND PELVIS WITH CONTRAST TECHNIQUE: Multidetector CT imaging of the abdomen and pelvis was performed using the standard protocol following bolus administration of intravenous contrast. CONTRAST:  16m OMNIPAQUE IOHEXOL 350 MG/ML SOLN COMPARISON:  None. FINDINGS: Lower chest: Small hiatal hernia.  No acute abnormality. Hepatobiliary: No focal liver abnormality is seen. No  gallstones, gallbladder wall thickening, or biliary dilatation. Pancreas: Inflammatory change and trace fluid seen about the pancreas. Mild gland hypoenhancement of the neck/body. Small volume fluid tracking into the right greater than left paracolic gutters. Spleen: Normal in size without focal abnormality. Adrenals/Urinary Tract: Adrenal glands are unremarkable. Kidneys are normal, without renal calculi, focal lesion, or hydronephrosis. Bladder is unremarkable. Stomach/Bowel: Stomach is within normal limits. Appendix appears normal. No evidence of bowel wall thickening, distention, or inflammatory changes. Vascular/Lymphatic: No significant vascular findings are present. No enlarged abdominal or pelvic lymph nodes. Reproductive: Prostatomegaly, measuring up to 5.2 cm. Other: No abdominal wall hernia or abnormality. No abdominopelvic ascites. Musculoskeletal: No acute or significant osseous findings. IMPRESSION: Findings compatible with  acute pancreatitis.Mild gland hypoenhancement of the neck/body of the pancreas, concerning for necrosis. Electronically Signed   By: Yetta Glassman M.D.   On: 08/07/2021 14:32   DG Abdomen Acute W/Chest  Result Date: 08/07/2021 CLINICAL DATA:  Abdominal pain EXAM: DG ABDOMEN ACUTE WITH 1 VIEW CHEST COMPARISON:  None. FINDINGS: Chest: The cardiomediastinal silhouette is within normal limits. There is no focal consolidation or pulmonary edema. There is no pleural effusion or pneumothorax. Median sternotomy wires are noted. Abdomen: There is a paucity of bowel gas with no evidence of mechanical obstruction. There is no evidence of free intraperitoneal air. There is no abnormal soft tissue calcification. Suture anchors are noted in the right humeral head. There is a mildly displaced fracture of the right lateral ninth rib. IMPRESSION: 1. Paucity of bowel gas with no evidence of mechanical obstruction. 2. Age-indeterminate mildly displaced fracture of the right lateral ninth rib.  Correlate with point tenderness. Electronically Signed   By: Valetta Mole M.D.   On: 08/07/2021 12:45   US Abdomen Limited RUQ (LIVER/GB)  Result Date: 08/07/2021 CLINICAL DATA:  History of acute pancreatitis on recent CT EXAM: ULTRASOUND ABDOMEN LIMITED RIGHT UPPER QUADRANT COMPARISON:  CT from earlier in the same day. FINDINGS: Gallbladder: Gallbladder is well distended without evidence of wall thickening or pericholecystic fluid. No cholelithiasis is noted. Ring down artifact is noted in the anterior wall of the gallbladder likely related adenomyomatosis Common bile duct: Diameter: 6.2 mm within normal limits for the patient's given age. Liver: Mild increase in echogenicity is noted. No focal mass is noted. Portal vein is patent on color Doppler imaging with normal direction of blood flow towards the liver. Other: None. IMPRESSION: Fatty liver. Adenomyomatosis of the gallbladder. No acute abnormality is noted. Electronically Signed   By: Inez Catalina M.D.   On: 08/07/2021 18:21    Scheduled Meds:  aspirin  81 mg Oral Daily   enoxaparin (LOVENOX) injection  40 mg Subcutaneous Q24H   irbesartan  37.5 mg Oral Daily   metoprolol tartrate  12.5 mg Oral q morning   multivitamin with minerals  1 tablet Oral Daily   Continuous Infusions:   LOS: 1 day   Time spent: 35 minutes.  Patrecia Pour, MD [facility] [email or web address] 08/08/2021, 6:04 PM

## 2021-08-09 DIAGNOSIS — E782 Mixed hyperlipidemia: Secondary | ICD-10-CM | POA: Diagnosis not present

## 2021-08-09 DIAGNOSIS — I10 Essential (primary) hypertension: Secondary | ICD-10-CM | POA: Diagnosis not present

## 2021-08-09 DIAGNOSIS — K8501 Idiopathic acute pancreatitis with uninfected necrosis: Secondary | ICD-10-CM | POA: Diagnosis not present

## 2021-08-09 DIAGNOSIS — I25810 Atherosclerosis of coronary artery bypass graft(s) without angina pectoris: Secondary | ICD-10-CM | POA: Diagnosis not present

## 2021-08-09 LAB — CBC
HCT: 45.9 % (ref 39.0–52.0)
Hemoglobin: 15.1 g/dL (ref 13.0–17.0)
MCH: 28.9 pg (ref 26.0–34.0)
MCHC: 32.9 g/dL (ref 30.0–36.0)
MCV: 87.9 fL (ref 80.0–100.0)
Platelets: 133 10*3/uL — ABNORMAL LOW (ref 150–400)
RBC: 5.22 MIL/uL (ref 4.22–5.81)
RDW: 13.9 % (ref 11.5–15.5)
WBC: 20.7 10*3/uL — ABNORMAL HIGH (ref 4.0–10.5)
nRBC: 0 % (ref 0.0–0.2)

## 2021-08-09 LAB — COMPREHENSIVE METABOLIC PANEL
ALT: 22 U/L (ref 0–44)
AST: 46 U/L — ABNORMAL HIGH (ref 15–41)
Albumin: 3.3 g/dL — ABNORMAL LOW (ref 3.5–5.0)
Alkaline Phosphatase: 51 U/L (ref 38–126)
Anion gap: 9 (ref 5–15)
BUN: 21 mg/dL (ref 8–23)
CO2: 29 mmol/L (ref 22–32)
Calcium: 8.2 mg/dL — ABNORMAL LOW (ref 8.9–10.3)
Chloride: 100 mmol/L (ref 98–111)
Creatinine, Ser: 1.03 mg/dL (ref 0.61–1.24)
GFR, Estimated: >60 mL/min (ref >60)
Glucose, Bld: 108 mg/dL — ABNORMAL HIGH (ref 70–99)
Potassium: 3.8 mmol/L (ref 3.5–5.1)
Sodium: 138 mmol/L (ref 135–145)
Total Bilirubin: 2.2 mg/dL — ABNORMAL HIGH (ref 0.3–1.2)
Total Protein: 6.1 g/dL — ABNORMAL LOW (ref 6.5–8.1)

## 2021-08-09 LAB — C-REACTIVE PROTEIN: CRP: 29.7 mg/dL — ABNORMAL HIGH (ref <1.0)

## 2021-08-09 NOTE — Progress Notes (Signed)
PROGRESS NOTE  [name]  [id number] DOB: 1951-06-20 DOA: 08/07/2021 PCP: Asencion Noble, MD   Brief Narrative: [name] is a 70 y.o. male with a history of CAD s/p CABG 2014, HTN, HLD on evolocumab, peripheral neuropathy who presented to the ED 8/17 with complaints of malaise, fatigue for a couple days and began having severe abdominal pain and several episodes of vomiting in the ED waiting room. He was afebrile with stable vital signs. Labs revealed leukocytosis (12.1k) and lipase elevation (8,989) with normal LFTs. CT abd/pelvis confirmed acute pancreatic inflammation with areas of hypoenhancement consistent with necrosis of the neck and body of the pancreas.   Assessment & Plan: Principal Problem:   Acute pancreatitis with uninfected necrosis Active Problems:   Essential hypertension   Mixed hyperlipidemia   Coronary artery disease involving native heart without angina pectoris  Acute pancreatitis with necrosis: Initial episode of pancreatitis with hypoenhancement on CT but no fluid collections at this time. No biliary cause noted on CT or labs, triglycerides not elevated.  - Supportive measures including IV fluids, NPO, IV analgesics and IV antiemetics to continue. - EtOH cessation discussed. Drinking ~6 drinks per week on average.  - Will hold lyrica and evolocumab for now as these have been rarely associated with pancreatitis.   SIRS: Due to pancreatitis without evidence of infection at this time.  - Continue trending fever curve, leukocytosis, CRP and monitoring blood cultures off abx.   AKI: Related to SIRS from pancreatitis (not sepsis at this time). Since resolved.  - continue IVF as above.  Hyperbilirubinemia:  - Trend with fractionation in AM. No evidence of hemolysis currently.  CAD s/p CABG 2014, HLD, HTN: No anginal complaints.  - Continue aspirin, metoprolol, irbesartan  - Holding repatha as above. LDL is 47.  Peripheral neuropathy: Followed by [facility]  Neurology. - Will hold lyrica for now, though this is unlikely to be culprit at this time.   Constipation:  - Continue scheduled and prn laxatives  DVT prophylaxis: Lovenox Code Status: Full Family Communication: Wife at bedside on 2nd rounds this morning. Disposition Plan:  Status is: Inpatient  Remains inpatient appropriate because:Ongoing active pain requiring inpatient pain management and IV treatments appropriate due to intensity of illness or inability to take PO  Dispo: The patient is from: Home              Anticipated d/c is to: Home              Patient currently is not medically stable to d/c.   Difficult to place patient No  Consultants:  None  Procedures:  None  Antimicrobials: None   Subjective: Fever last night, Tmax this AM was 100. Feels overall better today than last night, less pain, no wearing off of analgesia at end of dose. Able to get OOB. Not hungry, does want ice chips.   Objective: Vitals:   08/08/21 1812 08/08/21 1900 08/09/21 0035 08/09/21 0751  BP: 136/77  132/69 (!) 148/72  Pulse: 93  94 99  Resp: 20  18   Temp: (!) 101.3 F (38.5 C) 98.9 F (37.2 C) 100 F (37.8 C)   TempSrc: Oral Oral Oral   SpO2: 96%  92% 90%  Weight:      Height:        Intake/Output Summary (Last 24 hours) at 08/09/2021 1023 Last data filed at 08/09/2021 0900 Gross per 24 hour  Intake 1126.93 ml  Output 925 ml  Net 201.93 ml   Danley Danker  Weights   08/07/21 1044  Weight: 82.6 kg   Gen: 70 y.o. male in no distress Pulm: Nonlabored breathing room air. Clear. CV: Regular rate and rhythm. No murmur, rub, or gallop. No JVD, no dependent edema. GI: Abdomen distended and diffusely tender with attention to epigastrium with guarding. No rebound. Overall less distended with less tenderness and guarding than yesterday.   Ext: Warm, no deformities Skin: No rashes, lesions or ulcers on visualized skin. Neuro: Alert and oriented. No focal neurological deficits. Psych:  Judgement and insight appear fair. Mood euthymic & affect congruent. Behavior is appropriate.    Data Reviewed: I have personally reviewed following labs and imaging studies  CBC: Recent Labs  Lab 08/07/21 1130 08/08/21 0727 08/08/21 1911 08/09/21 0541  WBC 12.1* 20.3* 20.7* 20.7*  NEUTROABS 8.6* 17.8*  --   --   HGB 16.5 17.1* 16.1 15.1  HCT 48.6 51.4 48.4 45.9  MCV 85.9 87.0 89.1 87.9  PLT 242 194 140* [id number]*   Basic Metabolic Panel: Recent Labs  Lab 08/07/21 1130 08/08/21 0727 08/08/21 1911 08/09/21 0541  NA 136 138 136 138  K 3.6 4.6 3.7 3.8  CL 102 101 101 100  CO2 '24 27 25 29  '$ GLUCOSE 126* 161* 107* 108*  BUN 19 25* 24* 21  CREATININE 1.01 1.37* 1.05 1.03  CALCIUM 9.4 8.8* 7.8* 8.2*  MG  --  1.7  --   --    Liver Function Tests: Recent Labs  Lab 08/07/21 1130 08/08/21 0727 08/08/21 1911 08/09/21 0541  AST 27 39 43* 46*  ALT '28 23 22 22  '$ ALKPHOS 59 58 54 51  BILITOT 1.0 1.6* 2.0* 2.2*  PROT 8.0 7.1 6.7 6.1*  ALBUMIN 4.8 4.1 3.9 3.3*   Recent Labs  Lab 08/07/21 1130  LIPASE 8,989*   Lipid Profile: Recent Labs    08/07/21 1744  CHOL 106  HDL 37*  LDLCALC 47  TRIG 110  CHOLHDL 2.9   Recent Results (from the past 240 hour(s))  Resp Panel by RT-PCR (Flu A&B, Covid) Nasopharyngeal Swab     Status: None   Collection Time: 08/07/21  2:14 PM   Specimen: Nasopharyngeal Swab; Nasopharyngeal(NP) swabs in vial transport medium  Result Value Ref Range Status   SARS Coronavirus 2 by RT PCR NEGATIVE NEGATIVE Final    Comment: (NOTE) SARS-CoV-2 target nucleic acids are NOT DETECTED.  The SARS-CoV-2 RNA is generally detectable in upper respiratory specimens during the acute phase of infection. The lowest concentration of SARS-CoV-2 viral copies this assay can detect is 138 copies/mL. A negative result does not preclude SARS-Cov-2 infection and should not be used as the sole basis for treatment or other patient management decisions. A negative result may  occur with  improper specimen collection/handling, submission of specimen other than nasopharyngeal swab, presence of viral mutation(s) within the areas targeted by this assay, and inadequate number of viral copies(<138 copies/mL). A negative result must be combined with clinical observations, patient history, and epidemiological information. The expected result is Negative.  Fact Sheet for Patients:  EntrepreneurPulse.com.au  Fact Sheet for Healthcare Providers:  IncredibleEmployment.be  This test is no t yet approved or cleared by the [place] FDA and  has been authorized for detection and/or diagnosis of SARS-CoV-2 by FDA under an Emergency Use Authorization (EUA). This EUA will remain  in effect (meaning this test can be used) for the duration of the COVID-19 declaration under Section 564(b)(1) of the Act, 21 U.S.C.section 360bbb-3(b)(1), unless the authorization  is terminated  or revoked sooner.       Influenza A by PCR NEGATIVE NEGATIVE Final   Influenza B by PCR NEGATIVE NEGATIVE Final    Comment: (NOTE) The Xpert Xpress SARS-CoV-2/FLU/RSV plus assay is intended as an aid in the diagnosis of influenza from Nasopharyngeal swab specimens and should not be used as a sole basis for treatment. Nasal washings and aspirates are unacceptable for Xpert Xpress SARS-CoV-2/FLU/RSV testing.  Fact Sheet for Patients: EntrepreneurPulse.com.au  Fact Sheet for Healthcare Providers: IncredibleEmployment.be  This test is not yet approved or cleared by the [place] FDA and has been authorized for detection and/or diagnosis of SARS-CoV-2 by FDA under an Emergency Use Authorization (EUA). This EUA will remain in effect (meaning this test can be used) for the duration of the COVID-19 declaration under Section 564(b)(1) of the Act, 21 U.S.C. section 360bbb-3(b)(1), unless the authorization is terminated  or revoked.  Performed at [facility], [address], [place], [place] [address]   Culture, blood (routine x 2)     Status: None (Preliminary result)   Collection Time: 08/07/21  5:44 PM   Specimen: BLOOD LEFT HAND  Result Value Ref Range Status   Specimen Description BLOOD LEFT HAND  Final   Special Requests   Final    BOTTLES DRAWN AEROBIC AND ANAEROBIC Blood Culture adequate volume   Culture   Final    NO GROWTH 2 DAYS Performed at [facility], [address], [place], [place] [address]    Report Status PENDING  Incomplete  Culture, blood (routine x 2)     Status: None (Preliminary result)   Collection Time: 08/07/21  5:44 PM   Specimen: BLOOD RIGHT HAND  Result Value Ref Range Status   Specimen Description BLOOD RIGHT HAND  Final   Special Requests   Final    BOTTLES DRAWN AEROBIC AND ANAEROBIC Blood Culture adequate volume   Culture   Final    NO GROWTH 2 DAYS Performed at [facility], [address], [place], [place] [address]    Report Status PENDING  Incomplete  Culture, blood (routine x 2)     Status: None (Preliminary result)   Collection Time: 08/08/21  7:11 PM   Specimen: BLOOD  Result Value Ref Range Status   Specimen Description BLOOD BLOOD RIGHT HAND  Final   Special Requests   Final    BOTTLES DRAWN AEROBIC AND ANAEROBIC Blood Culture adequate volume   Culture   Final    NO GROWTH < 12 HOURS Performed at [facility], [address], [place], [place] [address]    Report Status PENDING  Incomplete  Culture, blood (routine x 2)     Status: None (Preliminary result)   Collection Time: 08/08/21  7:11 PM   Specimen: Right Antecubital; Blood  Result Value Ref Range Status   Specimen Description RIGHT ANTECUBITAL  Final   Special Requests   Final    BOTTLES DRAWN AEROBIC AND ANAEROBIC Blood Culture adequate volume   Culture   Final    NO GROWTH < 12 HOURS Performed at [facility], [address] [place], [place] [address]    Report Status PENDING   Incomplete      Radiology Studies: CT ABDOMEN PELVIS W CONTRAST  Result Date: 08/07/2021 CLINICAL DATA:  Left lower quadrant abdominal pain EXAM: CT ABDOMEN AND PELVIS WITH CONTRAST TECHNIQUE: Multidetector CT imaging of the abdomen and pelvis was performed using the standard protocol following bolus administration of intravenous contrast. CONTRAST:  32m OMNIPAQUE IOHEXOL 350 MG/ML SOLN COMPARISON:  None. FINDINGS: Lower chest: Small hiatal hernia.  No acute abnormality. Hepatobiliary: No focal liver abnormality is seen. No gallstones, gallbladder wall thickening, or biliary dilatation. Pancreas: Inflammatory change and trace fluid seen about the pancreas. Mild gland hypoenhancement of the neck/body. Small volume fluid tracking into the right greater than left paracolic gutters. Spleen: Normal in size without focal abnormality. Adrenals/Urinary Tract: Adrenal glands are unremarkable. Kidneys are normal, without renal calculi, focal lesion, or hydronephrosis. Bladder is unremarkable. Stomach/Bowel: Stomach is within normal limits. Appendix appears normal. No evidence of bowel wall thickening, distention, or inflammatory changes. Vascular/Lymphatic: No significant vascular findings are present. No enlarged abdominal or pelvic lymph nodes. Reproductive: Prostatomegaly, measuring up to 5.2 cm. Other: No abdominal wall hernia or abnormality. No abdominopelvic ascites. Musculoskeletal: No acute or significant osseous findings. IMPRESSION: Findings compatible with acute pancreatitis.Mild gland hypoenhancement of the neck/body of the pancreas, concerning for necrosis. Electronically Signed   By: LYetta GlassmanM.D.   On: 08/07/2021 14:32   DG Abdomen Acute W/Chest  Result Date: 08/07/2021 CLINICAL DATA:  Abdominal pain EXAM: DG ABDOMEN ACUTE WITH 1 VIEW CHEST COMPARISON:  None. FINDINGS: Chest: The cardiomediastinal silhouette is within normal limits. There is no focal consolidation or pulmonary edema. There is  no pleural effusion or pneumothorax. Median sternotomy wires are noted. Abdomen: There is a paucity of bowel gas with no evidence of mechanical obstruction. There is no evidence of free intraperitoneal air. There is no abnormal soft tissue calcification. Suture anchors are noted in the right humeral head. There is a mildly displaced fracture of the right lateral ninth rib. IMPRESSION: 1. Paucity of bowel gas with no evidence of mechanical obstruction. 2. Age-indeterminate mildly displaced fracture of the right lateral ninth rib. Correlate with point tenderness. Electronically Signed   By: PValetta MoleM.D.   On: 08/07/2021 12:45   [place]Abdomen Limited RUQ (LIVER/GB)  Result Date: 08/07/2021 CLINICAL DATA:  History of acute pancreatitis on recent CT EXAM: ULTRASOUND ABDOMEN LIMITED RIGHT UPPER QUADRANT COMPARISON:  CT from earlier in the same day. FINDINGS: Gallbladder: Gallbladder is well distended without evidence of wall thickening or pericholecystic fluid. No cholelithiasis is noted. Ring down artifact is noted in the anterior wall of the gallbladder likely related adenomyomatosis Common bile duct: Diameter: 6.2 mm within normal limits for the patient's given age. Liver: Mild increase in echogenicity is noted. No focal mass is noted. Portal vein is patent on color Doppler imaging with normal direction of blood flow towards the liver. Other: None. IMPRESSION: Fatty liver. Adenomyomatosis of the gallbladder. No acute abnormality is noted. Electronically Signed   By: MInez CatalinaM.D.   On: 08/07/2021 18:21    Scheduled Meds:  aspirin  81 mg Oral Daily   enoxaparin (LOVENOX) injection  40 mg Subcutaneous Q24H   irbesartan  37.5 mg Oral Daily   metoprolol tartrate  12.5 mg Oral q morning   senna-docusate  1 tablet Oral Daily   Continuous Infusions:  lactated ringers 125 mL/hr at 08/08/21 2019     LOS: 2 days   Time spent: 25 minutes.  RPatrecia Pour MD [facility] [email or web address] 08/09/2021,  10:23 AM

## 2021-08-09 NOTE — Care Management Important Message (Signed)
Important Message  Patient Details  Name: [name] MRN: [id number] Date of Birth: [birth year]   Medicare Important Message Given:  Yes     [name] 08/09/2021, 12:26 PM

## 2021-08-10 ENCOUNTER — Inpatient Hospital Stay (HOSPITAL_COMMUNITY): Payer: PPO

## 2021-08-10 DIAGNOSIS — I10 Essential (primary) hypertension: Secondary | ICD-10-CM | POA: Diagnosis not present

## 2021-08-10 DIAGNOSIS — I25810 Atherosclerosis of coronary artery bypass graft(s) without angina pectoris: Secondary | ICD-10-CM | POA: Diagnosis not present

## 2021-08-10 DIAGNOSIS — K8501 Idiopathic acute pancreatitis with uninfected necrosis: Secondary | ICD-10-CM | POA: Diagnosis not present

## 2021-08-10 DIAGNOSIS — E782 Mixed hyperlipidemia: Secondary | ICD-10-CM | POA: Diagnosis not present

## 2021-08-10 LAB — COMPREHENSIVE METABOLIC PANEL
ALT: 24 U/L (ref 0–44)
AST: 46 U/L — ABNORMAL HIGH (ref 15–41)
Albumin: 3.2 g/dL — ABNORMAL LOW (ref 3.5–5.0)
Alkaline Phosphatase: 62 U/L (ref 38–126)
Anion gap: 11 (ref 5–15)
BUN: 18 mg/dL (ref 8–23)
CO2: 28 mmol/L (ref 22–32)
Calcium: 8.3 mg/dL — ABNORMAL LOW (ref 8.9–10.3)
Chloride: 97 mmol/L — ABNORMAL LOW (ref 98–111)
Creatinine, Ser: 0.86 mg/dL (ref 0.61–1.24)
GFR, Estimated: >60 mL/min (ref >60)
Glucose, Bld: 108 mg/dL — ABNORMAL HIGH (ref 70–99)
Potassium: 3.1 mmol/L — ABNORMAL LOW (ref 3.5–5.1)
Sodium: 136 mmol/L (ref 135–145)
Total Bilirubin: 1.8 mg/dL — ABNORMAL HIGH (ref 0.3–1.2)
Total Protein: 6.4 g/dL — ABNORMAL LOW (ref 6.5–8.1)

## 2021-08-10 LAB — CBC
HCT: 44.1 % (ref 39.0–52.0)
Hemoglobin: 14.1 g/dL (ref 13.0–17.0)
MCH: 28.3 pg (ref 26.0–34.0)
MCHC: 32 g/dL (ref 30.0–36.0)
MCV: 88.4 fL (ref 80.0–100.0)
Platelets: 141 10*3/uL — ABNORMAL LOW (ref 150–400)
RBC: 4.99 MIL/uL (ref 4.22–5.81)
RDW: 13.6 % (ref 11.5–15.5)
WBC: 16.8 10*3/uL — ABNORMAL HIGH (ref 4.0–10.5)
nRBC: 0 % (ref 0.0–0.2)

## 2021-08-10 LAB — C-REACTIVE PROTEIN: CRP: 28 mg/dL — ABNORMAL HIGH (ref <1.0)

## 2021-08-10 MED ORDER — SENNOSIDES-DOCUSATE SODIUM 8.6-50 MG PO TABS
1.0000 | ORAL_TABLET | Freq: Once | ORAL | Status: AC
  Administered 2021-08-10: 1 via ORAL
  Filled 2021-08-10: qty 1

## 2021-08-10 MED ORDER — MORPHINE SULFATE (PF) 4 MG/ML IV SOLN
4.0000 mg | INTRAVENOUS | Status: DC | PRN
  Administered 2021-08-10: 6 mg via INTRAVENOUS
  Administered 2021-08-10: 4 mg via INTRAVENOUS
  Administered 2021-08-11 (×2): 6 mg via INTRAVENOUS
  Filled 2021-08-10 (×2): qty 2
  Filled 2021-08-10: qty 1
  Filled 2021-08-10: qty 2

## 2021-08-10 MED ORDER — IOHEXOL 9 MG/ML PO SOLN
ORAL | Status: AC
  Administered 2021-08-10: 500 mL
  Filled 2021-08-10: qty 1000

## 2021-08-10 MED ORDER — SENNOSIDES-DOCUSATE SODIUM 8.6-50 MG PO TABS
2.0000 | ORAL_TABLET | Freq: Two times a day (BID) | ORAL | Status: DC
  Administered 2021-08-10 – 2021-08-16 (×12): 2 via ORAL
  Filled 2021-08-10 (×12): qty 2

## 2021-08-10 MED ORDER — FLEET ENEMA 7-19 GM/118ML RE ENEM
1.0000 | ENEMA | Freq: Once | RECTAL | Status: AC
  Administered 2021-08-10: 1 via RECTAL

## 2021-08-10 MED ORDER — MORPHINE SULFATE (PF) 4 MG/ML IV SOLN
4.0000 mg | Freq: Once | INTRAVENOUS | Status: AC
  Administered 2021-08-10: 4 mg via INTRAVENOUS
  Filled 2021-08-10: qty 1

## 2021-08-10 MED ORDER — MORPHINE SULFATE (PF) 2 MG/ML IV SOLN
2.0000 mg | INTRAVENOUS | Status: DC | PRN
  Administered 2021-08-10: 4 mg via INTRAVENOUS
  Filled 2021-08-10 (×2): qty 2

## 2021-08-10 MED ORDER — IOHEXOL 350 MG/ML SOLN
85.0000 mL | Freq: Once | INTRAVENOUS | Status: AC | PRN
  Administered 2021-08-10: 85 mL via INTRAVENOUS

## 2021-08-10 MED ORDER — BISACODYL 10 MG RE SUPP: 10.0000 mg | Freq: Every day | RECTAL | Status: DC | PRN

## 2021-08-10 MED ORDER — LACTATED RINGERS IV SOLN
INTRAVENOUS | Status: DC
  Filled 2021-08-10 (×10): qty 1000

## 2021-08-10 NOTE — Progress Notes (Signed)
PROGRESS NOTE  [name]  [id number] DOB: Mar 22, 1951 DOA: 08/07/2021 PCP: Asencion Noble, MD   Brief Narrative: [name] is a 70 y.o. male with a history of CAD s/p CABG 2014, HTN, HLD on evolocumab, peripheral neuropathy who presented to the ED 8/17 with complaints of malaise, fatigue for a couple days and began having severe abdominal pain and several episodes of vomiting in the ED waiting room. He was afebrile with stable vital signs. Labs revealed leukocytosis (12.1k) and lipase elevation (8,989) with normal LFTs. CT abd/pelvis confirmed acute pancreatic inflammation with areas of hypoenhancement consistent with necrosis of the neck and body of the pancreas.   Assessment & Plan: Principal Problem:   Acute pancreatitis with uninfected necrosis Active Problems:   Essential hypertension   Mixed hyperlipidemia   Coronary artery disease involving native heart without angina pectoris  Acute pancreatitis with necrosis: Initial episode of pancreatitis with hypoenhancement on CT but no fluid collections at this time. No biliary cause noted on CT or labs, triglycerides not elevated.  - Supportive measures including IV fluids, NPO, IV analgesics and IV antiemetics to continue. Change dilaudid to IV morphine, may need increased dose. Titrate with q2h prn dosing.  - Labs overall improving, fever curve stabilizing. CRP remains elevated and will be trended. Not ready to progress to any diet, suspect we're in this for a long haul.  - EtOH cessation discussed. Drinking ~6 drinks per week on average.  - Will hold lyrica and evolocumab for now as these have been rarely associated with pancreatitis.   Constipation: At risk for ileus.  - Has been on scheduled and prn laxatives. Add senna now, and augment to 2 tabs BID. If still no BM, try dulcolax supp. Consider abd XR.   SIRS: Due to pancreatitis without evidence of infection at this time.  - Continue trending fever curve, leukocytosis, CRP and  monitoring blood cultures off abx (remain NGTD on 8/20).   AKI: Related to SIRS from pancreatitis (not sepsis at this time). Since resolved.  - Has improved. Continue IVF as above.  Hypokalemia:  - Supplement in IVF.  Hyperbilirubinemia:  - Trend with fractionation in AM. No evidence of hemolysis currently.  CAD s/p CABG 2014, HLD, HTN: No anginal complaints.  - Continue aspirin, metoprolol, irbesartan  - Holding repatha as above. LDL is 47.  Peripheral neuropathy: Followed by [facility]. - Will hold lyrica for now, though this is unlikely to be culprit at this time.   DVT prophylaxis: Lovenox Code Status: Full Family Communication: None at bedside this AM. Will speak with them today. Disposition Plan:  Status is: Inpatient  Remains inpatient appropriate because:Ongoing active pain requiring inpatient pain management and IV treatments appropriate due to intensity of illness or inability to take PO  Dispo: The patient is from: Home              Anticipated d/c is to: Home              Patient currently is not medically stable to d/c.   Difficult to place patient No  Consultants:  None  Procedures:  None  Antimicrobials: None   Subjective: No fevers, feels bloating is a bit worse, no BM since arrival. End of dose waning of dilaudid is worse, never completely gone with this dose, lowest is 2/10. Continues to be severe pain in epigastrium primarily. Minimal complaints of nausea.   Objective: Vitals:   08/09/21 0751 08/09/21 1352 08/09/21 2036 08/10/21 0451  BP: (!) 148/72 Marland Kitchen)  150/81 (!) 158/76 (!) 141/72  Pulse: 99 93 (!) 106 (!) 106  Resp:  '20 17 14  '$ Temp:  99.1 F (37.3 C) 100 F (37.8 C) 99.3 F (37.4 C)  TempSrc:  Oral Oral Oral  SpO2: 90% 91% 90% 93%  Weight:      Height:        Intake/Output Summary (Last 24 hours) at 08/10/2021 1128 Last data filed at 08/10/2021 0900 Gross per 24 hour  Intake 1000 ml  Output 1350 ml  Net -350 ml   Filed  Weights   08/07/21 1044  Weight: 82.6 kg   Gen: 70 y.o. male in no distress Pulm: Nonlabored breathing room air. Clear. CV: Regular rate and rhythm. No murmur, rub, or gallop. No JVD, no dependent edema. GI: Abdomen distended and tender mostly in epigastrium but remains soft without rebound +guarding, hypoactive BS. Overall stable. Ext: Warm, no deformities Skin: No rashes, lesions or ulcers on visualized skin. Neuro: Alert and oriented. No focal neurological deficits. Psych: Judgement and insight appear fair. Mood euthymic & affect congruent. Behavior is appropriate.    Data Reviewed: I have personally reviewed following labs and imaging studies  CBC: Recent Labs  Lab 08/07/21 1130 08/08/21 0727 08/08/21 1911 08/09/21 0541 08/10/21 0615  WBC 12.1* 20.3* 20.7* 20.7* 16.8*  NEUTROABS 8.6* 17.8*  --   --   --   HGB 16.5 17.1* 16.1 15.1 14.1  HCT 48.6 51.4 48.4 45.9 44.1  MCV 85.9 87.0 89.1 87.9 88.4  PLT 242 194 140* 133* [id number]*   Basic Metabolic Panel: Recent Labs  Lab 08/07/21 1130 08/08/21 0727 08/08/21 1911 08/09/21 0541 08/10/21 0615  NA 136 138 136 138 136  K 3.6 4.6 3.7 3.8 3.1*  CL 102 101 101 100 97*  CO2 '24 27 25 29 28  '$ GLUCOSE 126* 161* 107* 108* 108*  BUN 19 25* 24* 21 18  CREATININE 1.01 1.37* 1.05 1.03 0.86  CALCIUM 9.4 8.8* 7.8* 8.2* 8.3*  MG  --  1.7  --   --   --    Liver Function Tests: Recent Labs  Lab 08/07/21 1130 08/08/21 0727 08/08/21 1911 08/09/21 0541 08/10/21 0615  AST 27 39 43* 46* 46*  ALT '28 23 22 22 24  '$ ALKPHOS 59 58 54 51 62  BILITOT 1.0 1.6* 2.0* 2.2* 1.8*  PROT 8.0 7.1 6.7 6.1* 6.4*  ALBUMIN 4.8 4.1 3.9 3.3* 3.2*   Recent Labs  Lab 08/07/21 1130  LIPASE 8,989*   Lipid Profile: Recent Labs    08/07/21 1744  CHOL 106  HDL 37*  LDLCALC 47  TRIG 110  CHOLHDL 2.9   Recent Results (from the past 240 hour(s))  Resp Panel by RT-PCR (Flu A&B, Covid) Nasopharyngeal Swab     Status: None   Collection Time: 08/07/21   2:14 PM   Specimen: Nasopharyngeal Swab; Nasopharyngeal(NP) swabs in vial transport medium  Result Value Ref Range Status   SARS Coronavirus 2 by RT PCR NEGATIVE NEGATIVE Final    Comment: (NOTE) SARS-CoV-2 target nucleic acids are NOT DETECTED.  The SARS-CoV-2 RNA is generally detectable in upper respiratory specimens during the acute phase of infection. The lowest concentration of SARS-CoV-2 viral copies this assay can detect is 138 copies/mL. A negative result does not preclude SARS-Cov-2 infection and should not be used as the sole basis for treatment or other patient management decisions. A negative result may occur with  improper specimen collection/handling, submission of specimen other than nasopharyngeal swab, presence  of viral mutation(s) within the areas targeted by this assay, and inadequate number of viral copies(<138 copies/mL). A negative result must be combined with clinical observations, patient history, and epidemiological information. The expected result is Negative.  Fact Sheet for Patients:  EntrepreneurPulse.com.au  Fact Sheet for Healthcare Providers:  IncredibleEmployment.be  This test is no t yet approved or cleared by the [place] FDA and  has been authorized for detection and/or diagnosis of SARS-CoV-2 by FDA under an Emergency Use Authorization (EUA). This EUA will remain  in effect (meaning this test can be used) for the duration of the COVID-19 declaration under Section 564(b)(1) of the Act, 21 U.S.C.section 360bbb-3(b)(1), unless the authorization is terminated  or revoked sooner.       Influenza A by PCR NEGATIVE NEGATIVE Final   Influenza B by PCR NEGATIVE NEGATIVE Final    Comment: (NOTE) The Xpert Xpress SARS-CoV-2/FLU/RSV plus assay is intended as an aid in the diagnosis of influenza from Nasopharyngeal swab specimens and should not be used as a sole basis for treatment. Nasal washings and aspirates  are unacceptable for Xpert Xpress SARS-CoV-2/FLU/RSV testing.  Fact Sheet for Patients: EntrepreneurPulse.com.au  Fact Sheet for Healthcare Providers: IncredibleEmployment.be  This test is not yet approved or cleared by the [place] FDA and has been authorized for detection and/or diagnosis of SARS-CoV-2 by FDA under an Emergency Use Authorization (EUA). This EUA will remain in effect (meaning this test can be used) for the duration of the COVID-19 declaration under Section 564(b)(1) of the Act, 21 U.S.C. section 360bbb-3(b)(1), unless the authorization is terminated or revoked.  Performed at [facility], [address] [place], [place] [address]   Culture, blood (routine x 2)     Status: None (Preliminary result)   Collection Time: 08/07/21  5:44 PM   Specimen: BLOOD LEFT HAND  Result Value Ref Range Status   Specimen Description BLOOD LEFT HAND  Final   Special Requests   Final    BOTTLES DRAWN AEROBIC AND ANAEROBIC Blood Culture adequate volume   Culture   Final    NO GROWTH 3 DAYS Performed at [facility], [address], [place], [place] [address]    Report Status PENDING  Incomplete  Culture, blood (routine x 2)     Status: None (Preliminary result)   Collection Time: 08/07/21  5:44 PM   Specimen: BLOOD RIGHT HAND  Result Value Ref Range Status   Specimen Description BLOOD RIGHT HAND  Final   Special Requests   Final    BOTTLES DRAWN AEROBIC AND ANAEROBIC Blood Culture adequate volume   Culture   Final    NO GROWTH 3 DAYS Performed at [facility], [address], [place], [place] [address]    Report Status PENDING  Incomplete  Culture, blood (routine x 2)     Status: None (Preliminary result)   Collection Time: 08/08/21  7:11 PM   Specimen: BLOOD  Result Value Ref Range Status   Specimen Description BLOOD BLOOD RIGHT HAND  Final   Special Requests   Final    BOTTLES DRAWN AEROBIC AND ANAEROBIC Blood Culture adequate  volume   Culture   Final    NO GROWTH 2 DAYS Performed at [facility], [address] [place], [place] [address]    Report Status PENDING  Incomplete  Culture, blood (routine x 2)     Status: None (Preliminary result)   Collection Time: 08/08/21  7:11 PM   Specimen: Right Antecubital; Blood  Result Value  Ref Range Status   Specimen Description RIGHT ANTECUBITAL  Final   Special Requests   Final    BOTTLES DRAWN AEROBIC AND ANAEROBIC Blood Culture adequate volume   Culture   Final    NO GROWTH 2 DAYS Performed at [facility], [address], [place], [place] [address]    Report Status PENDING  Incomplete      Radiology Studies: No results found.  Scheduled Meds:  aspirin  81 mg Oral Daily   enoxaparin (LOVENOX) injection  40 mg Subcutaneous Q24H   irbesartan  37.5 mg Oral Daily   metoprolol tartrate  12.5 mg Oral q morning   senna-docusate  1 tablet Oral Once   senna-docusate  2 tablet Oral BID   Continuous Infusions:  lactated ringers with kcl 125 mL/hr at 08/10/21 1101     LOS: 3 days   Time spent: 35 minutes.  Patrecia Pour, MD [facility] [email or web address] 08/10/2021, 11:28 AM

## 2021-08-10 NOTE — Progress Notes (Signed)
Patient c/o constipation with last BM this past Wednesday. . On call provider paged via [place]. Order given for fleets enema with noted results after administration

## 2021-08-11 DIAGNOSIS — I25810 Atherosclerosis of coronary artery bypass graft(s) without angina pectoris: Secondary | ICD-10-CM | POA: Diagnosis not present

## 2021-08-11 DIAGNOSIS — I10 Essential (primary) hypertension: Secondary | ICD-10-CM | POA: Diagnosis not present

## 2021-08-11 DIAGNOSIS — K8501 Idiopathic acute pancreatitis with uninfected necrosis: Secondary | ICD-10-CM | POA: Diagnosis not present

## 2021-08-11 DIAGNOSIS — E782 Mixed hyperlipidemia: Secondary | ICD-10-CM | POA: Diagnosis not present

## 2021-08-11 LAB — C-REACTIVE PROTEIN: CRP: 22.4 mg/dL — ABNORMAL HIGH (ref <1.0)

## 2021-08-11 MED ORDER — FLEET ENEMA 7-19 GM/118ML RE ENEM
1.0000 | ENEMA | Freq: Every day | RECTAL | Status: DC | PRN
  Administered 2021-08-11 – 2021-08-12 (×2): 1 via RECTAL

## 2021-08-11 MED ORDER — MORPHINE SULFATE (PF) 2 MG/ML IV SOLN
2.0000 mg | INTRAVENOUS | Status: DC | PRN
  Administered 2021-08-11: 4 mg via INTRAVENOUS
  Administered 2021-08-11 (×2): 6 mg via INTRAVENOUS
  Administered 2021-08-11: 4 mg via INTRAVENOUS
  Administered 2021-08-12 (×5): 6 mg via INTRAVENOUS
  Administered 2021-08-12: 2 mg via INTRAVENOUS
  Administered 2021-08-12 – 2021-08-13 (×4): 6 mg via INTRAVENOUS
  Administered 2021-08-13: 2 mg via INTRAVENOUS
  Administered 2021-08-13: 4 mg via INTRAVENOUS
  Administered 2021-08-13 – 2021-08-14 (×2): 6 mg via INTRAVENOUS
  Administered 2021-08-14 (×2): 4 mg via INTRAVENOUS
  Administered 2021-08-15: 2 mg via INTRAVENOUS
  Filled 2021-08-11 (×5): qty 3
  Filled 2021-08-11 (×2): qty 2
  Filled 2021-08-11: qty 1
  Filled 2021-08-11 (×2): qty 2
  Filled 2021-08-11 (×2): qty 3
  Filled 2021-08-11: qty 2
  Filled 2021-08-11: qty 1
  Filled 2021-08-11: qty 3
  Filled 2021-08-11: qty 1
  Filled 2021-08-11 (×2): qty 3
  Filled 2021-08-11: qty 2
  Filled 2021-08-11 (×2): qty 3
  Filled 2021-08-11: qty 1
  Filled 2021-08-11: qty 3

## 2021-08-11 NOTE — Progress Notes (Signed)
Had small amount of clear liquid tray for supper, some jello and drink with no increased pain or discomfort.  Asking to have 4 mg instead of 6 mg of morphine before shift change.  Had small bm after fleets enema this afternoon.

## 2021-08-11 NOTE — Progress Notes (Signed)
PROGRESS NOTE  [name]  [id number] DOB: August 22, 1951 DOA: 08/07/2021 PCP: Asencion Noble, MD   Brief Narrative: [name] is a 70 y.o. male with a history of CAD s/p CABG 2014, HTN, HLD on evolocumab, peripheral neuropathy who presented to the ED 8/17 with complaints of malaise, fatigue for a couple days and began having severe abdominal pain and several episodes of vomiting in the ED waiting room. He was afebrile with stable vital signs. Labs revealed leukocytosis (12.1k) and lipase elevation (8,989) with normal LFTs. CT abd/pelvis confirmed acute pancreatic inflammation with areas of hypoenhancement consistent with necrosis of the neck and body of the pancreas.   Assessment & Plan: Principal Problem:   Acute pancreatitis with uninfected necrosis Active Problems:   Essential hypertension   Mixed hyperlipidemia   Coronary artery disease involving native heart without angina pectoris  Acute pancreatitis with necrosis: Initial episode of pancreatitis with hypoenhancement on CT but no fluid collections at this time. No biliary cause noted on CT or labs, triglycerides not elevated.  - Supportive measures including IV fluids, NPO, IV analgesics and IV antiemetics to continue. Changed dilaudid to IV morphine with improved control/duration of effect. Change to 2-'6mg'$  IV prn based on pain scale to allow for lower dosing. No sedation noted.   - Leukocytosis improving, fever dissipating, pain improving and exam continues to be benign. No new fluid collection or complication on repeat CT 8/20. Pt desires to advance diet which is a good sign. Will start to allow water ad lib today. If this goes well, IV pain medication need decreases, and CRP begins to improve, would allow for clears in next 24-48 hours.   - EtOH cessation discussed. Drinking ~6 drinks per week on average.  - Will hold lyrica and evolocumab for now as these have been rarely associated with pancreatitis.   Constipation: At risk for  ileus. No obstruction on CT - Has been on scheduled and prn laxatives. +BM w/enema and contrast administration 8/20.  SIRS: Due to pancreatitis without evidence of infection at this time.  - Continue trending fever curve, leukocytosis, CRP and monitoring blood cultures off abx (remain NGTD on 8/20).   AKI: Related to SIRS from pancreatitis (not sepsis at this time). Since resolved.  - Has improved. Continue IVF as above. Decrease rate based on his plan to start water and presence of ascites/pleural effusion developing on my personal review of CT abd/pelvis from 8/20.  Hypokalemia:  - Supplement in IVF. Recheck in AM.  Hyperbilirubinemia:  - Trend with fractionation in AM. No evidence of hemolysis currently.  CAD s/p CABG 2014, HLD, HTN: No anginal complaints.  - Continue aspirin, metoprolol, irbesartan  - Holding repatha as above. LDL is 47.  Peripheral neuropathy: Followed by [facility]. - Will hold lyrica for now, though this is unlikely to be culprit at this time.   DVT prophylaxis: Lovenox Code Status: Full Family Communication: None at bedside this AM. Speaking with wife daily. Disposition Plan:  Status is: Inpatient  Remains inpatient appropriate because:Ongoing active pain requiring inpatient pain management and IV treatments appropriate due to intensity of illness or inability to take PO  Dispo: The patient is from: Home              Anticipated d/c is to: Home              Patient currently is not medically stable to d/c.   Difficult to place patient No  Consultants:  None  Procedures:  None  Antimicrobials: None   Subjective: Feeling better this morning, wants to advance diet. Had BM w/enema last night. No fevers, chills, or systemic feeling of illness. Abdominal pain is constant, improved with morphine, and overall less severe than before. No nausea or vomiting.  Objective: Vitals:   08/10/21 0451 08/10/21 1251 08/10/21 2102 08/11/21 0508  BP: (!)  141/72 (!) 146/85 (!) 162/79 (!) 142/84  Pulse: (!) 106 89 99 (!) 108  Resp: '14 18 18 16  '$ Temp: 99.3 F (37.4 C) 98.7 F (37.1 C) 98.3 F (36.8 C) 98.1 F (36.7 C)  TempSrc: Oral Oral    SpO2: 93% 92% 94% 92%  Weight:      Height:        Intake/Output Summary (Last 24 hours) at 08/11/2021 [id number] Last data filed at 08/11/2021 0510 Gross per 24 hour  Intake 2288.48 ml  Output 1401 ml  Net 887.48 ml   Filed Weights   08/07/21 1044  Weight: 82.6 kg   Gen: 70 y.o. male in no distress Pulm: Nonlabored breathing room air. Clear, diminished at base. CV: Regular rate and rhythm. No murmur, rub, or gallop. No JVD, no dependent edema. GI: Abdomen distended but soft, tender in epigastrium, less so elsewhere, Improved active bowel sounds.  Ext: Warm, no deformities Skin: No rashes, lesions or ulcers on visualized skin. Neuro: Alert and oriented. No focal neurological deficits. Psych: Judgement and insight appear fair. Mood euthymic & affect congruent. Behavior is appropriate.    Data Reviewed: I have personally reviewed following labs and imaging studies  CBC: Recent Labs  Lab 08/07/21 1130 08/08/21 0727 08/08/21 1911 08/09/21 0541 08/10/21 0615  WBC 12.1* 20.3* 20.7* 20.7* 16.8*  NEUTROABS 8.6* 17.8*  --   --   --   HGB 16.5 17.1* 16.1 15.1 14.1  HCT 48.6 51.4 48.4 45.9 44.1  MCV 85.9 87.0 89.1 87.9 88.4  PLT 242 194 140* 133* [id number]*   Basic Metabolic Panel: Recent Labs  Lab 08/07/21 1130 08/08/21 0727 08/08/21 1911 08/09/21 0541 08/10/21 0615  NA 136 138 136 138 136  K 3.6 4.6 3.7 3.8 3.1*  CL 102 101 101 100 97*  CO2 '24 27 25 29 28  '$ GLUCOSE 126* 161* 107* 108* 108*  BUN 19 25* 24* 21 18  CREATININE 1.01 1.37* 1.05 1.03 0.86  CALCIUM 9.4 8.8* 7.8* 8.2* 8.3*  MG  --  1.7  --   --   --    Liver Function Tests: Recent Labs  Lab 08/07/21 1130 08/08/21 0727 08/08/21 1911 08/09/21 0541 08/10/21 0615  AST 27 39 43* 46* 46*  ALT '28 23 22 22 24  '$ ALKPHOS 59 58 54  51 62  BILITOT 1.0 1.6* 2.0* 2.2* 1.8*  PROT 8.0 7.1 6.7 6.1* 6.4*  ALBUMIN 4.8 4.1 3.9 3.3* 3.2*   Recent Labs  Lab 08/07/21 1130  LIPASE 8,989*   Lipid Profile: No results for input(s): CHOL, HDL, LDLCALC, TRIG, CHOLHDL, LDLDIRECT in the last 72 hours.  Recent Results (from the past 240 hour(s))  Resp Panel by RT-PCR (Flu A&B, Covid) Nasopharyngeal Swab     Status: None   Collection Time: 08/07/21  2:14 PM   Specimen: Nasopharyngeal Swab; Nasopharyngeal(NP) swabs in vial transport medium  Result Value Ref Range Status   SARS Coronavirus 2 by RT PCR NEGATIVE NEGATIVE Final    Comment: (NOTE) SARS-CoV-2 target nucleic acids are NOT DETECTED.  The SARS-CoV-2 RNA is generally detectable in upper respiratory specimens during the acute phase of  infection. The lowest concentration of SARS-CoV-2 viral copies this assay can detect is 138 copies/mL. A negative result does not preclude SARS-Cov-2 infection and should not be used as the sole basis for treatment or other patient management decisions. A negative result may occur with  improper specimen collection/handling, submission of specimen other than nasopharyngeal swab, presence of viral mutation(s) within the areas targeted by this assay, and inadequate number of viral copies(<138 copies/mL). A negative result must be combined with clinical observations, patient history, and epidemiological information. The expected result is Negative.  Fact Sheet for Patients:  EntrepreneurPulse.com.au  Fact Sheet for Healthcare Providers:  IncredibleEmployment.be  This test is no t yet approved or cleared by the [place] FDA and  has been authorized for detection and/or diagnosis of SARS-CoV-2 by FDA under an Emergency Use Authorization (EUA). This EUA will remain  in effect (meaning this test can be used) for the duration of the COVID-19 declaration under Section 564(b)(1) of the Act,  21 U.S.C.section 360bbb-3(b)(1), unless the authorization is terminated  or revoked sooner.       Influenza A by PCR NEGATIVE NEGATIVE Final   Influenza B by PCR NEGATIVE NEGATIVE Final    Comment: (NOTE) The Xpert Xpress SARS-CoV-2/FLU/RSV plus assay is intended as an aid in the diagnosis of influenza from Nasopharyngeal swab specimens and should not be used as a sole basis for treatment. Nasal washings and aspirates are unacceptable for Xpert Xpress SARS-CoV-2/FLU/RSV testing.  Fact Sheet for Patients: EntrepreneurPulse.com.au  Fact Sheet for Healthcare Providers: IncredibleEmployment.be  This test is not yet approved or cleared by the [place] FDA and has been authorized for detection and/or diagnosis of SARS-CoV-2 by FDA under an Emergency Use Authorization (EUA). This EUA will remain in effect (meaning this test can be used) for the duration of the COVID-19 declaration under Section 564(b)(1) of the Act, 21 U.S.C. section 360bbb-3(b)(1), unless the authorization is terminated or revoked.  Performed at [facility], [address], [place], [place] [address]   Culture, blood (routine x 2)     Status: None (Preliminary result)   Collection Time: 08/07/21  5:44 PM   Specimen: BLOOD LEFT HAND  Result Value Ref Range Status   Specimen Description BLOOD LEFT HAND  Final   Special Requests   Final    BOTTLES DRAWN AEROBIC AND ANAEROBIC Blood Culture adequate volume   Culture   Final    NO GROWTH 3 DAYS Performed at [facility], [address], [place], [place] [address]    Report Status PENDING  Incomplete  Culture, blood (routine x 2)     Status: None (Preliminary result)   Collection Time: 08/07/21  5:44 PM   Specimen: BLOOD RIGHT HAND  Result Value Ref Range Status   Specimen Description BLOOD RIGHT HAND  Final   Special Requests   Final    BOTTLES DRAWN AEROBIC AND ANAEROBIC Blood Culture adequate volume   Culture   Final     NO GROWTH 3 DAYS Performed at [facility], [address], [place],  [address]    Report Status PENDING  Incomplete  Culture, blood (routine x 2)     Status: None (Preliminary result)   Collection Time: 08/08/21  7:11 PM   Specimen: BLOOD  Result Value Ref Range Status   Specimen Description BLOOD BLOOD RIGHT HAND  Final   Special Requests   Final    BOTTLES DRAWN AEROBIC AND ANAEROBIC Blood Culture adequate volume   Culture   Final  NO GROWTH 2 DAYS Performed at [facility], [address] [place], [place] [address]    Report Status PENDING  Incomplete  Culture, blood (routine x 2)     Status: None (Preliminary result)   Collection Time: 08/08/21  7:11 PM   Specimen: Right Antecubital; Blood  Result Value Ref Range Status   Specimen Description RIGHT ANTECUBITAL  Final   Special Requests   Final    BOTTLES DRAWN AEROBIC AND ANAEROBIC Blood Culture adequate volume   Culture   Final    NO GROWTH 2 DAYS Performed at [facility], [address], [place], [place] [address]    Report Status PENDING  Incomplete      Radiology Studies: CT ABDOMEN PELVIS W CONTRAST  Result Date: 08/10/2021 CLINICAL DATA:  Abdominal pain.  Recent diagnosis of pancreatitis. EXAM: CT ABDOMEN AND PELVIS WITH CONTRAST TECHNIQUE: Multidetector CT imaging of the abdomen and pelvis was performed using the standard protocol following bolus administration of intravenous contrast. CONTRAST:  33m OMNIPAQUE IOHEXOL 350 MG/ML SOLN COMPARISON:  CT of the abdomen pelvis dated 08/07/2021. FINDINGS: Lower chest: Small bilateral pleural effusions with partial compressive atelectasis of the lower lobes, new since the prior CT. Pneumonia is not excluded. Clinical correlation is recommended. There is advanced coronary vascular calcification. No intra-abdominal free air. Small ascites, increased since the prior CT. Hepatobiliary: The liver is unremarkable. High attenuating content within the gallbladder may represent  sludge or small stones or vicarious excretion of contrast. Pancreas: Diffuse inflammatory changes of the pancreas in keeping with known pancreatitis. No drainable fluid collection/abscess or pseudocyst. Areas of hypoenhancement primarily involving the head and uncinate process of the pancreas may be due to edema or early necrosis. Spleen: Normal in size without focal abnormality. Adrenals/Urinary Tract: Indeterminate 15 mm left adrenal nodule. The right adrenal gland is unremarkable. There is no hydronephrosis on either side. There is symmetric enhancement and excretion of contrast by both kidneys. The visualized ureters and urinary bladder appear unremarkable. Stomach/Bowel: There is no bowel obstruction or active inflammation. The appendix is normal. Vascular/Lymphatic: Moderate aortoiliac atherosclerotic disease. The IVC is unremarkable. No portal venous gas. There is no adenopathy. Reproductive: Mildly enlarged prostate gland measuring 5 cm in transverse axial diameter. The seminal vesicles are symmetric. Other: Mild subcutaneous edema. Musculoskeletal: Mild degenerative changes. No acute osseous pathology. IMPRESSION: 1. Acute pancreatitis. No drainable fluid collection/abscess or pseudocyst. 2. Small bilateral pleural effusions with partial compressive atelectasis of the lower lobes, new since the prior CT. Pneumonia is not excluded. Clinical correlation is recommended. 3. Small ascites, increased since the prior CT. 4. No bowel obstruction. Normal appendix. 5. Aortic Atherosclerosis (ICD10-I70.0). Electronically Signed   By: AAnner CreteM.D.   On: 08/10/2021 19:38    Scheduled Meds:  aspirin  81 mg Oral Daily   enoxaparin (LOVENOX) injection  40 mg Subcutaneous Q24H   irbesartan  37.5 mg Oral Daily   metoprolol tartrate  12.5 mg Oral q morning   senna-docusate  2 tablet Oral BID   Continuous Infusions:  lactated ringers with kcl 125 mL/hr at 08/10/21 1853     LOS: 4 days   Time spent: 35  minutes.  RPatrecia Pour MD [facility] [email or web address] 08/11/2021, 8:32 AM

## 2021-08-12 DIAGNOSIS — K8501 Idiopathic acute pancreatitis with uninfected necrosis: Secondary | ICD-10-CM | POA: Diagnosis not present

## 2021-08-12 DIAGNOSIS — I10 Essential (primary) hypertension: Secondary | ICD-10-CM | POA: Diagnosis not present

## 2021-08-12 DIAGNOSIS — E782 Mixed hyperlipidemia: Secondary | ICD-10-CM | POA: Diagnosis not present

## 2021-08-12 DIAGNOSIS — I25810 Atherosclerosis of coronary artery bypass graft(s) without angina pectoris: Secondary | ICD-10-CM | POA: Diagnosis not present

## 2021-08-12 LAB — CBC WITH DIFFERENTIAL/PLATELET
Abs Immature Granulocytes: 0.49 10*3/uL — ABNORMAL HIGH (ref 0.00–0.07)
Basophils Absolute: 0.1 10*3/uL (ref 0.0–0.1)
Basophils Relative: 1 %
Eosinophils Absolute: 0.2 10*3/uL (ref 0.0–0.5)
Eosinophils Relative: 1 %
HCT: 35.9 % — ABNORMAL LOW (ref 39.0–52.0)
Hemoglobin: 11.9 g/dL — ABNORMAL LOW (ref 13.0–17.0)
Immature Granulocytes: 4 %
Lymphocytes Relative: 6 %
Lymphs Abs: 0.9 10*3/uL (ref 0.7–4.0)
MCH: 29.4 pg (ref 26.0–34.0)
MCHC: 33.1 g/dL (ref 30.0–36.0)
MCV: 88.6 fL (ref 80.0–100.0)
Monocytes Absolute: 1.4 10*3/uL — ABNORMAL HIGH (ref 0.1–1.0)
Monocytes Relative: 10 %
Neutro Abs: 11 10*3/uL — ABNORMAL HIGH (ref 1.7–7.7)
Neutrophils Relative %: 78 %
Platelets: 167 10*3/uL (ref 150–400)
RBC: 4.05 MIL/uL — ABNORMAL LOW (ref 4.22–5.81)
RDW: 13.5 % (ref 11.5–15.5)
WBC: 14 10*3/uL — ABNORMAL HIGH (ref 4.0–10.5)
nRBC: 0 % (ref 0.0–0.2)

## 2021-08-12 LAB — CULTURE, BLOOD (ROUTINE X 2)
Culture: NO GROWTH
Culture: NO GROWTH
Special Requests: ADEQUATE
Special Requests: ADEQUATE

## 2021-08-12 LAB — COMPREHENSIVE METABOLIC PANEL
ALT: 33 U/L (ref 0–44)
AST: 36 U/L (ref 15–41)
Albumin: 2.8 g/dL — ABNORMAL LOW (ref 3.5–5.0)
Alkaline Phosphatase: 60 U/L (ref 38–126)
Anion gap: 9 (ref 5–15)
BUN: 16 mg/dL (ref 8–23)
CO2: 28 mmol/L (ref 22–32)
Calcium: 7.9 mg/dL — ABNORMAL LOW (ref 8.9–10.3)
Chloride: 98 mmol/L (ref 98–111)
Creatinine, Ser: 0.78 mg/dL (ref 0.61–1.24)
GFR, Estimated: >60 mL/min (ref >60)
Glucose, Bld: 102 mg/dL — ABNORMAL HIGH (ref 70–99)
Potassium: 3.3 mmol/L — ABNORMAL LOW (ref 3.5–5.1)
Sodium: 135 mmol/L (ref 135–145)
Total Bilirubin: 2.1 mg/dL — ABNORMAL HIGH (ref 0.3–1.2)
Total Protein: 5.6 g/dL — ABNORMAL LOW (ref 6.5–8.1)

## 2021-08-12 LAB — C-REACTIVE PROTEIN: CRP: 19.3 mg/dL — ABNORMAL HIGH (ref <1.0)

## 2021-08-12 MED ORDER — POTASSIUM CHLORIDE 2 MEQ/ML IV SOLN
INTRAVENOUS | Status: DC
  Filled 2021-08-12 (×6): qty 1000

## 2021-08-12 NOTE — Care Management Important Message (Signed)
Important Message  Patient Details  Name: [name] MRN: [id number] Date of Birth: [birth year]   Medicare Important Message Given:  Yes     [name] 08/12/2021, 1:42 PM

## 2021-08-12 NOTE — Progress Notes (Signed)
PROGRESS NOTE  [name]  [id number] DOB: [birth year] DOA: 08/07/2021 PCP: Asencion Noble, MD   Brief Narrative: [name] is a 70 y.o. male with a history of CAD s/p CABG 2014, HTN, HLD on evolocumab, peripheral neuropathy who presented to the ED 8/17 with complaints of malaise, fatigue for a couple days and began having severe abdominal pain and several episodes of vomiting in the ED waiting room. He was afebrile with stable vital signs. Labs revealed leukocytosis (12.1k) and lipase elevation (8,989) with normal LFTs. CT abd/pelvis confirmed acute pancreatic inflammation with areas of hypoenhancement consistent with necrosis of the neck and body of the pancreas.   Assessment & Plan: Principal Problem:   Acute pancreatitis with uninfected necrosis Active Problems:   Essential hypertension   Mixed hyperlipidemia   Coronary artery disease involving native heart without angina pectoris  Acute pancreatitis with necrosis: Initial episode of pancreatitis with hypoenhancement on CT but no fluid collections at this time. No biliary cause noted on CT or labs, triglycerides not elevated.  - Supportive measures including IV fluids, NPO, IV analgesics and IV antiemetics to continue. Continue morphine 2-'6mg'$  IV prn, received 6 doses in past 24 hours. Exam stable. No sedation noted.   - Leukocytosis and CRP are improving, fever resolved, exam continues to be benign. No new fluid collection or complication on repeat CT 8/20. We've advanced to clears which we will continue with until IV analgesic needs decline. - EtOH cessation discussed. Drinking ~6 drinks per week on average.  - Will hold lyrica and evolocumab for now as these have been rarely associated with pancreatitis.   Constipation: At risk for ileus. No obstruction on CT - Has been on scheduled and prn laxatives. +BM w/enema and contrast administration 8/20. Can continue to use this prn.   SIRS: Due to pancreatitis without evidence of infection  at this time.  - Continue trending fever curve, leukocytosis, CRP and monitoring blood cultures off abx (remain NGTD on 8/20).   AKI: Related to SIRS from pancreatitis (not sepsis at this time). Since resolved.  - Resolved. Continue IVF as above. Decreased rate based on his plan to start water and presence of ascites/pleural effusion developing on my personal review of CT abd/pelvis from 8/20.  Hypokalemia:  - Augment supplementation in IVF. Recheck in AM.  Hyperbilirubinemia: Other LFTs have normalized. - Check fractionated levels in AM.    CAD s/p CABG 2014, HLD, HTN: No anginal complaints.  - Continue aspirin, metoprolol, irbesartan  - Holding repatha as above. LDL is 47.  Peripheral neuropathy: Followed by [facility]. - Will hold lyrica for now, though this is unlikely to be culprit at this time.   DVT prophylaxis: Lovenox Code Status: Full Family Communication: None at bedside this AM.  Disposition Plan:  Status is: Inpatient  Remains inpatient appropriate because:Ongoing active pain requiring inpatient pain management and IV treatments appropriate due to intensity of illness or inability to take PO  Dispo: The patient is from: Home              Anticipated d/c is to: Home              Patient currently is not medically stable to d/c.   Difficult to place patient No  Consultants:  None  Procedures:  None  Antimicrobials: None   Subjective: Had no increased pain with jello last night, states pain control is improved, though still requiring '6mg'$  IV morphine 6 times in past 24 hours. No nausea or vomiting  reported. Continues to have BMs with enema. No fever or other complaints. Still not terribly hungry.  Objective: Vitals:   08/11/21 0508 08/11/21 1543 08/11/21 2056 08/12/21 0349  BP: (!) 142/84 (!) 165/84 (!) 149/81 (!) 156/73  Pulse: (!) 108 80 81 82  Resp: '16 18 18 16  '$ Temp: 98.1 F (36.7 C) 99 F (37.2 C) 100 F (37.8 C) 98 F (36.7 C)  TempSrc:   Oral Oral   SpO2: 92% 91% 95% 98%  Weight:      Height:        Intake/Output Summary (Last 24 hours) at 08/12/2021 [id number] Last data filed at 08/12/2021 0606 Gross per 24 hour  Intake 840 ml  Output 2000 ml  Net -1160 ml   Filed Weights   08/07/21 1044  Weight: 82.6 kg   Gen: 70 y.o. male in no distress Pulm: Nonlabored breathing room air. Clear. CV: Regular rate and rhythm. No murmur, rub, or gallop. No JVD, no dependent edema. GI: Abdomen soft, moderately tender in epigastrium, distention decreased, +BS.  Ext: Warm, no deformities Skin: No rashes, lesions or ulcers on visualized skin. Neuro: Alert and oriented. No focal neurological deficits. Psych: Judgement and insight appear fair. Mood euthymic & affect congruent. Behavior is appropriate.    Data Reviewed: I have personally reviewed following labs and imaging studies  CBC: Recent Labs  Lab 08/07/21 1130 08/08/21 0727 08/08/21 1911 08/09/21 0541 08/10/21 0615 08/12/21 0640  WBC 12.1* 20.3* 20.7* 20.7* 16.8* 14.0*  NEUTROABS 8.6* 17.8*  --   --   --  11.0*  HGB 16.5 17.1* 16.1 15.1 14.1 11.9*  HCT 48.6 51.4 48.4 45.9 44.1 35.9*  MCV 85.9 87.0 89.1 87.9 88.4 88.6  PLT 242 194 140* 133* 141* [id number]   Basic Metabolic Panel: Recent Labs  Lab 08/08/21 0727 08/08/21 1911 08/09/21 0541 08/10/21 0615 08/12/21 0640  NA 138 136 138 136 135  K 4.6 3.7 3.8 3.1* 3.3*  CL 101 101 100 97* 98  CO2 '27 25 29 28 28  '$ GLUCOSE 161* 107* 108* 108* 102*  BUN 25* 24* '21 18 16  '$ CREATININE 1.37* 1.05 1.03 0.86 0.78  CALCIUM 8.8* 7.8* 8.2* 8.3* 7.9*  MG 1.7  --   --   --   --    Liver Function Tests: Recent Labs  Lab 08/08/21 0727 08/08/21 1911 08/09/21 0541 08/10/21 0615 08/12/21 0640  AST 39 43* 46* 46* 36  ALT '23 22 22 24 '$ 33  ALKPHOS 58 54 51 62 60  BILITOT 1.6* 2.0* 2.2* 1.8* 2.1*  PROT 7.1 6.7 6.1* 6.4* 5.6*  ALBUMIN 4.1 3.9 3.3* 3.2* 2.8*   Recent Labs  Lab 08/07/21 1130  LIPASE 8,989*   Lipid Profile: No  results for input(s): CHOL, HDL, LDLCALC, TRIG, CHOLHDL, LDLDIRECT in the last 72 hours.  Recent Results (from the past 240 hour(s))  Resp Panel by RT-PCR (Flu A&B, Covid) Nasopharyngeal Swab     Status: None   Collection Time: 08/07/21  2:14 PM   Specimen: Nasopharyngeal Swab; Nasopharyngeal(NP) swabs in vial transport medium  Result Value Ref Range Status   SARS Coronavirus 2 by RT PCR NEGATIVE NEGATIVE Final    Comment: (NOTE) SARS-CoV-2 target nucleic acids are NOT DETECTED.  The SARS-CoV-2 RNA is generally detectable in upper respiratory specimens during the acute phase of infection. The lowest concentration of SARS-CoV-2 viral copies this assay can detect is 138 copies/mL. A negative result does not preclude SARS-Cov-2 infection and should not be used  as the sole basis for treatment or other patient management decisions. A negative result may occur with  improper specimen collection/handling, submission of specimen other than nasopharyngeal swab, presence of viral mutation(s) within the areas targeted by this assay, and inadequate number of viral copies(<138 copies/mL). A negative result must be combined with clinical observations, patient history, and epidemiological information. The expected result is Negative.  Fact Sheet for Patients:  EntrepreneurPulse.com.au  Fact Sheet for Healthcare Providers:  IncredibleEmployment.be  This test is no t yet approved or cleared by the [place] FDA and  has been authorized for detection and/or diagnosis of SARS-CoV-2 by FDA under an Emergency Use Authorization (EUA). This EUA will remain  in effect (meaning this test can be used) for the duration of the COVID-19 declaration under Section 564(b)(1) of the Act, 21 U.S.C.section 360bbb-3(b)(1), unless the authorization is terminated  or revoked sooner.       Influenza A by PCR NEGATIVE NEGATIVE Final   Influenza B by PCR NEGATIVE NEGATIVE Final     Comment: (NOTE) The Xpert Xpress SARS-CoV-2/FLU/RSV plus assay is intended as an aid in the diagnosis of influenza from Nasopharyngeal swab specimens and should not be used as a sole basis for treatment. Nasal washings and aspirates are unacceptable for Xpert Xpress SARS-CoV-2/FLU/RSV testing.  Fact Sheet for Patients: EntrepreneurPulse.com.au  Fact Sheet for Healthcare Providers: IncredibleEmployment.be  This test is not yet approved or cleared by the [place] FDA and has been authorized for detection and/or diagnosis of SARS-CoV-2 by FDA under an Emergency Use Authorization (EUA). This EUA will remain in effect (meaning this test can be used) for the duration of the COVID-19 declaration under Section 564(b)(1) of the Act, 21 U.S.C. section 360bbb-3(b)(1), unless the authorization is terminated or revoked.  Performed at [facility], [address] [place], [place] [address]   Culture, blood (routine x 2)     Status: None   Collection Time: 08/07/21  5:44 PM   Specimen: BLOOD LEFT HAND  Result Value Ref Range Status   Specimen Description BLOOD LEFT HAND  Final   Special Requests   Final    BOTTLES DRAWN AEROBIC AND ANAEROBIC Blood Culture adequate volume   Culture   Final    NO GROWTH 5 DAYS Performed at [facility], [address], [place], [place] [address]    Report Status 08/12/2021 FINAL  Final  Culture, blood (routine x 2)     Status: None   Collection Time: 08/07/21  5:44 PM   Specimen: BLOOD RIGHT HAND  Result Value Ref Range Status   Specimen Description BLOOD RIGHT HAND  Final   Special Requests   Final    BOTTLES DRAWN AEROBIC AND ANAEROBIC Blood Culture adequate volume   Culture   Final    NO GROWTH 5 DAYS Performed at [facility], [address], [place], [place] [address]    Report Status 08/12/2021 FINAL  Final  Culture, blood (routine x 2)     Status: None (Preliminary result)   Collection Time: 08/08/21   7:11 PM   Specimen: BLOOD  Result Value Ref Range Status   Specimen Description BLOOD BLOOD RIGHT HAND  Final   Special Requests   Final    BOTTLES DRAWN AEROBIC AND ANAEROBIC Blood Culture adequate volume   Culture   Final    NO GROWTH 4 DAYS Performed at [facility], [address], [place], [place] [address]    Report Status PENDING  Incomplete  Culture, blood (routine  x 2)     Status: None (Preliminary result)   Collection Time: 08/08/21  7:11 PM   Specimen: Right Antecubital; Blood  Result Value Ref Range Status   Specimen Description RIGHT ANTECUBITAL  Final   Special Requests   Final    BOTTLES DRAWN AEROBIC AND ANAEROBIC Blood Culture adequate volume   Culture   Final    NO GROWTH 4 DAYS Performed at [facility], [address] [place], [place] [address]    Report Status PENDING  Incomplete      Radiology Studies: CT ABDOMEN PELVIS W CONTRAST  Result Date: 08/10/2021 CLINICAL DATA:  Abdominal pain.  Recent diagnosis of pancreatitis. EXAM: CT ABDOMEN AND PELVIS WITH CONTRAST TECHNIQUE: Multidetector CT imaging of the abdomen and pelvis was performed using the standard protocol following bolus administration of intravenous contrast. CONTRAST:  24m OMNIPAQUE IOHEXOL 350 MG/ML SOLN COMPARISON:  CT of the abdomen pelvis dated 08/07/2021. FINDINGS: Lower chest: Small bilateral pleural effusions with partial compressive atelectasis of the lower lobes, new since the prior CT. Pneumonia is not excluded. Clinical correlation is recommended. There is advanced coronary vascular calcification. No intra-abdominal free air. Small ascites, increased since the prior CT. Hepatobiliary: The liver is unremarkable. High attenuating content within the gallbladder may represent sludge or small stones or vicarious excretion of contrast. Pancreas: Diffuse inflammatory changes of the pancreas in keeping with known pancreatitis. No drainable fluid collection/abscess or pseudocyst. Areas of hypoenhancement  primarily involving the head and uncinate process of the pancreas may be due to edema or early necrosis. Spleen: Normal in size without focal abnormality. Adrenals/Urinary Tract: Indeterminate 15 mm left adrenal nodule. The right adrenal gland is unremarkable. There is no hydronephrosis on either side. There is symmetric enhancement and excretion of contrast by both kidneys. The visualized ureters and urinary bladder appear unremarkable. Stomach/Bowel: There is no bowel obstruction or active inflammation. The appendix is normal. Vascular/Lymphatic: Moderate aortoiliac atherosclerotic disease. The IVC is unremarkable. No portal venous gas. There is no adenopathy. Reproductive: Mildly enlarged prostate gland measuring 5 cm in transverse axial diameter. The seminal vesicles are symmetric. Other: Mild subcutaneous edema. Musculoskeletal: Mild degenerative changes. No acute osseous pathology. IMPRESSION: 1. Acute pancreatitis. No drainable fluid collection/abscess or pseudocyst. 2. Small bilateral pleural effusions with partial compressive atelectasis of the lower lobes, new since the prior CT. Pneumonia is not excluded. Clinical correlation is recommended. 3. Small ascites, increased since the prior CT. 4. No bowel obstruction. Normal appendix. 5. Aortic Atherosclerosis (ICD10-I70.0). Electronically Signed   By: AAnner CreteM.D.   On: 08/10/2021 19:38    Scheduled Meds:  aspirin  81 mg Oral Daily   enoxaparin (LOVENOX) injection  40 mg Subcutaneous Q24H   irbesartan  37.5 mg Oral Daily   metoprolol tartrate  12.5 mg Oral q morning   senna-docusate  2 tablet Oral BID   Continuous Infusions:  lactated ringers with kcl 75 mL/hr at 08/12/21 0353     LOS: 5 days   Time spent: 35 minutes.  RPatrecia Pour MD [facility] [email or web address] 08/12/2021, 8:33 AM

## 2021-08-13 DIAGNOSIS — E782 Mixed hyperlipidemia: Secondary | ICD-10-CM | POA: Diagnosis not present

## 2021-08-13 DIAGNOSIS — I10 Essential (primary) hypertension: Secondary | ICD-10-CM | POA: Diagnosis not present

## 2021-08-13 DIAGNOSIS — K8501 Idiopathic acute pancreatitis with uninfected necrosis: Secondary | ICD-10-CM | POA: Diagnosis not present

## 2021-08-13 DIAGNOSIS — I25810 Atherosclerosis of coronary artery bypass graft(s) without angina pectoris: Secondary | ICD-10-CM | POA: Diagnosis not present

## 2021-08-13 LAB — CBC WITH DIFFERENTIAL/PLATELET
Abs Immature Granulocytes: 1.09 10*3/uL — ABNORMAL HIGH (ref 0.00–0.07)
Basophils Absolute: 0.1 10*3/uL (ref 0.0–0.1)
Basophils Relative: 1 %
Eosinophils Absolute: 0.2 10*3/uL (ref 0.0–0.5)
Eosinophils Relative: 2 %
HCT: 36.2 % — ABNORMAL LOW (ref 39.0–52.0)
Hemoglobin: 12 g/dL — ABNORMAL LOW (ref 13.0–17.0)
Immature Granulocytes: 8 %
Lymphocytes Relative: 8 %
Lymphs Abs: 1.1 10*3/uL (ref 0.7–4.0)
MCH: 28.4 pg (ref 26.0–34.0)
MCHC: 33.1 g/dL (ref 30.0–36.0)
MCV: 85.8 fL (ref 80.0–100.0)
Monocytes Absolute: 1.4 10*3/uL — ABNORMAL HIGH (ref 0.1–1.0)
Monocytes Relative: 10 %
Neutro Abs: 10.1 10*3/uL — ABNORMAL HIGH (ref 1.7–7.7)
Neutrophils Relative %: 71 %
Platelets: 199 10*3/uL (ref 150–400)
RBC: 4.22 MIL/uL (ref 4.22–5.81)
RDW: 13.4 % (ref 11.5–15.5)
WBC: 14 10*3/uL — ABNORMAL HIGH (ref 4.0–10.5)
nRBC: 0 % (ref 0.0–0.2)

## 2021-08-13 LAB — BASIC METABOLIC PANEL
Anion gap: 11 (ref 5–15)
BUN: 13 mg/dL (ref 8–23)
CO2: 27 mmol/L (ref 22–32)
Calcium: 8 mg/dL — ABNORMAL LOW (ref 8.9–10.3)
Chloride: 97 mmol/L — ABNORMAL LOW (ref 98–111)
Creatinine, Ser: 0.73 mg/dL (ref 0.61–1.24)
GFR, Estimated: >60 mL/min (ref >60)
Glucose, Bld: 121 mg/dL — ABNORMAL HIGH (ref 70–99)
Potassium: 3.4 mmol/L — ABNORMAL LOW (ref 3.5–5.1)
Sodium: 135 mmol/L (ref 135–145)

## 2021-08-13 LAB — CULTURE, BLOOD (ROUTINE X 2)
Culture: NO GROWTH
Culture: NO GROWTH
Special Requests: ADEQUATE
Special Requests: ADEQUATE

## 2021-08-13 LAB — BILIRUBIN, FRACTIONATED(TOT/DIR/INDIR)
Bilirubin, Direct: 0.8 mg/dL — ABNORMAL HIGH (ref 0.0–0.2)
Indirect Bilirubin: 1.3 mg/dL — ABNORMAL HIGH (ref 0.3–0.9)
Total Bilirubin: 2.1 mg/dL — ABNORMAL HIGH (ref 0.3–1.2)

## 2021-08-13 LAB — C-REACTIVE PROTEIN: CRP: 18.8 mg/dL — ABNORMAL HIGH (ref <1.0)

## 2021-08-13 MED ORDER — CALCIUM CARBONATE ANTACID 500 MG PO CHEW
1.0000 | CHEWABLE_TABLET | Freq: Three times a day (TID) | ORAL | Status: DC | PRN
  Administered 2021-08-13: 400 mg via ORAL
  Filled 2021-08-13 (×2): qty 2

## 2021-08-13 MED ORDER — CALCIUM CARBONATE ANTACID 500 MG PO CHEW
1.0000 | CHEWABLE_TABLET | Freq: Once | ORAL | Status: AC
  Administered 2021-08-13: 200 mg via ORAL
  Filled 2021-08-13: qty 1

## 2021-08-13 MED ORDER — PANTOPRAZOLE SODIUM 40 MG PO TBEC
40.0000 mg | DELAYED_RELEASE_TABLET | Freq: Every day | ORAL | Status: DC
  Administered 2021-08-13 – 2021-08-14 (×2): 40 mg via ORAL
  Filled 2021-08-13 (×2): qty 1

## 2021-08-13 NOTE — Progress Notes (Signed)
PROGRESS NOTE  [name]  [id number] DOB: 1951/03/14 DOA: 08/07/2021 PCP: Asencion Noble, MD   Brief Narrative: [name] is a 71 y.o. male with a history of CAD s/p CABG 2014, HTN, HLD on evolocumab, peripheral neuropathy who presented to the ED 8/17 with complaints of malaise, fatigue for a couple days and began having severe abdominal pain and several episodes of vomiting in the ED waiting room. He was afebrile with stable vital signs. Labs revealed leukocytosis (12.1k) and lipase elevation (8,989) with normal LFTs. CT abd/pelvis confirmed acute pancreatic inflammation with areas of hypoenhancement consistent with necrosis of the neck and body of the pancreas.   Assessment & Plan: Principal Problem:   Acute pancreatitis with uninfected necrosis Active Problems:   Essential hypertension   Mixed hyperlipidemia   Coronary artery disease involving native heart without angina pectoris  Acute pancreatitis with necrosis: Initial episode of pancreatitis with hypoenhancement on CT but no fluid collections at this time. No biliary cause noted on CT or labs, triglycerides not elevated.  - Supportive measures including IV fluids, IV analgesics and IV antiemetics to continue. Continue morphine 2-'6mg'$  IV prn, still receiving frequent doses. Continue with clear liquid diet for at least another 24 hours.    - Leukocytosis and CRP have improved. Fever resolved, exam continues to be benign. No new fluid collection or complication on repeat CT 8/20. We've advanced to clears which we will continue with until IV analgesic needs decline. - EtOH cessation discussed. Drinking ~6 drinks per week on average. Pt today reports a milder episode somewhat similar to this earlier this years after drinking some beer and margaritas.  - For completeness, check IgG4. - Will hold lyrica and evolocumab for now as these have been rarely associated with pancreatitis.   Constipation: At risk for ileus. No obstruction on CT -  Has been on scheduled and prn laxatives. +BM w/enema and contrast administration 8/20. Can continue to use this prn.   SIRS: Due to pancreatitis without evidence of infection at this time.  - Continue trending fever curve, leukocytosis, CRP and monitoring blood cultures off abx (remain NGTD on 8/20).   AKI: Related to SIRS from pancreatitis (not sepsis at this time). Since resolved.  - Resolved. Continue IVF as above. Decreased rate based on his plan to start water and presence of ascites/pleural effusion developing on my personal review of CT abd/pelvis from 8/20.  Hypokalemia:  - Continue supplementation in IVF.    Hyperbilirubinemia: Other LFTs have normalized. - Check fractionated levels in AM.    CAD s/p CABG 2014, HLD, HTN: No anginal complaints.  - Continue aspirin, metoprolol, irbesartan  - Holding repatha as above. LDL is 47.  Peripheral neuropathy: Followed by [facility]. - Will hold lyrica for now, though this is unlikely to be culprit at this time.   Acid reflux:  - Start PPI, continue prn tums. No red flags on history of exam to suggest EGD needed urgently.   DVT prophylaxis: Lovenox Code Status: Full Family Communication: None at bedside this AM.  Disposition Plan:  Status is: Inpatient  Remains inpatient appropriate because:Ongoing active pain requiring inpatient pain management and IV treatments appropriate due to intensity of illness or inability to take PO  Dispo: The patient is from: Home              Anticipated d/c is to: Home              Patient currently is not medically stable to d/c.  Difficult to place patient No  Consultants:  None  Procedures:  None  Antimicrobials: None   Subjective: Feels slightly better today, though still with severe pain treating with IV morphine frequently. Taking enemas daily with good results. Still bloated and had an episode of reflux described as uncomfortable belching with bad taste. This improved with  tums. No fever, cough, dysuria, or wounds.   Objective: Vitals:   08/12/21 1215 08/12/21 2114 08/13/21 0418 08/13/21 0745  BP: (!) 148/81 (!) 161/82 (!) 169/82 (!) 158/85  Pulse: 74 90 90 100  Resp: '17 16 15   '$ Temp: 99.2 F (37.3 C) 99.3 F (37.4 C) 98.6 F (37 C)   TempSrc: Oral Oral Oral   SpO2: 97% 95% 92% 93%  Weight:      Height:        Intake/Output Summary (Last 24 hours) at 08/13/2021 1014 Last data filed at 08/13/2021 0700 Gross per 24 hour  Intake 757.14 ml  Output 600 ml  Net 157.14 ml   Filed Weights   08/07/21 1044  Weight: 82.6 kg   Gen: 70 y.o. male in no distress Pulm: Nonlabored breathing room air. Clear. CV: Regular rate and rhythm. No murmur, rub, or gallop. No JVD, no dependent edema. GI: Abdomen soft, mildly distended, moderately tender in epigastrium without rebound or guarding. +BS.   Ext: Warm, no deformities Skin: No rashes, lesions or ulcers on visualized skin. Neuro: Alert and oriented. No focal neurological deficits. Psych: Judgement and insight appear fair. Mood euthymic & affect congruent. Behavior is appropriate.    Data Reviewed: I have personally reviewed following labs and imaging studies  CBC: Recent Labs  Lab 08/07/21 1130 08/08/21 0727 08/08/21 1911 08/09/21 0541 08/10/21 0615 08/12/21 0640 08/13/21 0552  WBC 12.1* 20.3* 20.7* 20.7* 16.8* 14.0* 14.0*  NEUTROABS 8.6* 17.8*  --   --   --  11.0* 10.1*  HGB 16.5 17.1* 16.1 15.1 14.1 11.9* 12.0*  HCT 48.6 51.4 48.4 45.9 44.1 35.9* 36.2*  MCV 85.9 87.0 89.1 87.9 88.4 88.6 85.8  PLT 242 194 140* 133* 141* 167 [id number]   Basic Metabolic Panel: Recent Labs  Lab 08/08/21 0727 08/08/21 1911 08/09/21 0541 08/10/21 0615 08/12/21 0640 08/13/21 0552  NA 138 136 138 136 135 135  K 4.6 3.7 3.8 3.1* 3.3* 3.4*  CL 101 101 100 97* 98 97*  CO2 '27 25 29 28 28 27  '$ GLUCOSE 161* 107* 108* 108* 102* 121*  BUN 25* 24* '21 18 16 13  '$ CREATININE 1.37* 1.05 1.03 0.86 0.78 0.73  CALCIUM 8.8* 7.8*  8.2* 8.3* 7.9* 8.0*  MG 1.7  --   --   --   --   --    Liver Function Tests: Recent Labs  Lab 08/08/21 0727 08/08/21 1911 08/09/21 0541 08/10/21 0615 08/12/21 0640 08/13/21 0552  AST 39 43* 46* 46* 36  --   ALT '23 22 22 24 '$ 33  --   ALKPHOS 58 54 51 62 60  --   BILITOT 1.6* 2.0* 2.2* 1.8* 2.1* 2.1*  PROT 7.1 6.7 6.1* 6.4* 5.6*  --   ALBUMIN 4.1 3.9 3.3* 3.2* 2.8*  --    Recent Labs  Lab 08/07/21 1130  LIPASE 8,989*   Lipid Profile: No results for input(s): CHOL, HDL, LDLCALC, TRIG, CHOLHDL, LDLDIRECT in the last 72 hours.  Recent Results (from the past 240 hour(s))  Resp Panel by RT-PCR (Flu A&B, Covid) Nasopharyngeal Swab     Status: None   Collection  Time: 08/07/21  2:14 PM   Specimen: Nasopharyngeal Swab; Nasopharyngeal(NP) swabs in vial transport medium  Result Value Ref Range Status   SARS Coronavirus 2 by RT PCR NEGATIVE NEGATIVE Final    Comment: (NOTE) SARS-CoV-2 target nucleic acids are NOT DETECTED.  The SARS-CoV-2 RNA is generally detectable in upper respiratory specimens during the acute phase of infection. The lowest concentration of SARS-CoV-2 viral copies this assay can detect is 138 copies/mL. A negative result does not preclude SARS-Cov-2 infection and should not be used as the sole basis for treatment or other patient management decisions. A negative result may occur with  improper specimen collection/handling, submission of specimen other than nasopharyngeal swab, presence of viral mutation(s) within the areas targeted by this assay, and inadequate number of viral copies(<138 copies/mL). A negative result must be combined with clinical observations, patient history, and epidemiological information. The expected result is Negative.  Fact Sheet for Patients:  EntrepreneurPulse.com.au  Fact Sheet for Healthcare Providers:  IncredibleEmployment.be  This test is no t yet approved or cleared by the [place] FDA  and  has been authorized for detection and/or diagnosis of SARS-CoV-2 by FDA under an Emergency Use Authorization (EUA). This EUA will remain  in effect (meaning this test can be used) for the duration of the COVID-19 declaration under Section 564(b)(1) of the Act, 21 U.S.C.section 360bbb-3(b)(1), unless the authorization is terminated  or revoked sooner.       Influenza A by PCR NEGATIVE NEGATIVE Final   Influenza B by PCR NEGATIVE NEGATIVE Final    Comment: (NOTE) The Xpert Xpress SARS-CoV-2/FLU/RSV plus assay is intended as an aid in the diagnosis of influenza from Nasopharyngeal swab specimens and should not be used as a sole basis for treatment. Nasal washings and aspirates are unacceptable for Xpert Xpress SARS-CoV-2/FLU/RSV testing.  Fact Sheet for Patients: EntrepreneurPulse.com.au  Fact Sheet for Healthcare Providers: IncredibleEmployment.be  This test is not yet approved or cleared by the [place] FDA and has been authorized for detection and/or diagnosis of SARS-CoV-2 by FDA under an Emergency Use Authorization (EUA). This EUA will remain in effect (meaning this test can be used) for the duration of the COVID-19 declaration under Section 564(b)(1) of the Act, 21 U.S.C. section 360bbb-3(b)(1), unless the authorization is terminated or revoked.  Performed at [facility], [address] [place], [place] [address]   Culture, blood (routine x 2)     Status: None   Collection Time: 08/07/21  5:44 PM   Specimen: BLOOD LEFT HAND  Result Value Ref Range Status   Specimen Description BLOOD LEFT HAND  Final   Special Requests   Final    BOTTLES DRAWN AEROBIC AND ANAEROBIC Blood Culture adequate volume   Culture   Final    NO GROWTH 5 DAYS Performed at [facility], [address] [place], [place] [address]    Report Status 08/12/2021 FINAL  Final  Culture, blood (routine x 2)     Status: None   Collection Time: 08/07/21  5:44 PM    Specimen: BLOOD RIGHT HAND  Result Value Ref Range Status   Specimen Description BLOOD RIGHT HAND  Final   Special Requests   Final    BOTTLES DRAWN AEROBIC AND ANAEROBIC Blood Culture adequate volume   Culture   Final    NO GROWTH 5 DAYS Performed at [facility], [address] [place],  [address]    Report Status 08/12/2021 FINAL  Final  Culture, blood (routine x 2)  Status: None   Collection Time: 08/08/21  7:11 PM   Specimen: BLOOD  Result Value Ref Range Status   Specimen Description BLOOD BLOOD RIGHT HAND  Final   Special Requests   Final    BOTTLES DRAWN AEROBIC AND ANAEROBIC Blood Culture adequate volume   Culture   Final    NO GROWTH 5 DAYS Performed at [facility], [address] [place], [place] [address]    Report Status 08/13/2021 FINAL  Final  Culture, blood (routine x 2)     Status: None   Collection Time: 08/08/21  7:11 PM   Specimen: Right Antecubital; Blood  Result Value Ref Range Status   Specimen Description RIGHT ANTECUBITAL  Final   Special Requests   Final    BOTTLES DRAWN AEROBIC AND ANAEROBIC Blood Culture adequate volume   Culture   Final    NO GROWTH 5 DAYS Performed at [facility], [address], [place], [place] [address]    Report Status 08/13/2021 FINAL  Final      Radiology Studies: No results found.  Scheduled Meds:  aspirin  81 mg Oral Daily   enoxaparin (LOVENOX) injection  40 mg Subcutaneous Q24H   irbesartan  37.5 mg Oral Daily   metoprolol tartrate  12.5 mg Oral q morning   senna-docusate  2 tablet Oral BID   Continuous Infusions:  lactated ringers with kcl 75 mL/hr at 08/13/21 0247     LOS: 6 days   Time spent: 25 minutes.  Patrecia Pour, MD [facility] [email or web address] 08/13/2021, 10:14 AM

## 2021-08-14 LAB — BASIC METABOLIC PANEL
Anion gap: 9 (ref 5–15)
BUN: 10 mg/dL (ref 8–23)
CO2: 26 mmol/L (ref 22–32)
Calcium: 8 mg/dL — ABNORMAL LOW (ref 8.9–10.3)
Chloride: 97 mmol/L — ABNORMAL LOW (ref 98–111)
Creatinine, Ser: 0.71 mg/dL (ref 0.61–1.24)
GFR, Estimated: >60 mL/min (ref >60)
Glucose, Bld: 111 mg/dL — ABNORMAL HIGH (ref 70–99)
Potassium: 3.6 mmol/L (ref 3.5–5.1)
Sodium: 132 mmol/L — ABNORMAL LOW (ref 135–145)

## 2021-08-14 LAB — CBC
HCT: 36.8 % — ABNORMAL LOW (ref 39.0–52.0)
Hemoglobin: 12.1 g/dL — ABNORMAL LOW (ref 13.0–17.0)
MCH: 28.5 pg (ref 26.0–34.0)
MCHC: 32.9 g/dL (ref 30.0–36.0)
MCV: 86.8 fL (ref 80.0–100.0)
Platelets: 230 10*3/uL (ref 150–400)
RBC: 4.24 MIL/uL (ref 4.22–5.81)
RDW: 13.5 % (ref 11.5–15.5)
WBC: 15.1 10*3/uL — ABNORMAL HIGH (ref 4.0–10.5)
nRBC: 0 % (ref 0.0–0.2)

## 2021-08-14 LAB — C-REACTIVE PROTEIN: CRP: 16.8 mg/dL — ABNORMAL HIGH (ref <1.0)

## 2021-08-14 MED ORDER — PANTOPRAZOLE SODIUM 40 MG PO TBEC
40.0000 mg | DELAYED_RELEASE_TABLET | Freq: Two times a day (BID) | ORAL | Status: DC
  Administered 2021-08-14 – 2021-08-16 (×4): 40 mg via ORAL
  Filled 2021-08-14 (×4): qty 1

## 2021-08-14 MED ORDER — OXYCODONE HCL 5 MG PO TABS
5.0000 mg | ORAL_TABLET | Freq: Four times a day (QID) | ORAL | Status: DC | PRN
  Administered 2021-08-14 – 2021-08-16 (×6): 5 mg via ORAL
  Filled 2021-08-14 (×7): qty 1

## 2021-08-14 MED ORDER — POLYETHYLENE GLYCOL 3350 17 G PO PACK
17.0000 g | PACK | Freq: Every day | ORAL | Status: DC
  Administered 2021-08-14 – 2021-08-16 (×3): 17 g via ORAL
  Filled 2021-08-14 (×4): qty 1

## 2021-08-14 MED ORDER — POTASSIUM CHLORIDE IN NACL 20-0.9 MEQ/L-% IV SOLN: INTRAVENOUS | Status: DC

## 2021-08-14 NOTE — Progress Notes (Signed)
PROGRESS NOTE    [name]  [id number] DOB: 23-Dec-1950 DOA: 08/07/2021 PCP: Asencion Noble, MD   Brief Narrative: 70 year old with past medical history significant for CAD status post CABG 2014, hypertension, HLD on evolocumab, peripheral neuropathy who presents to the ED 8/17 with complaints of malaise, fatigue for a couple of days, severe abdominal pain and several episodes of vomiting while in the ED.  He was found to have lipase level 8989 with normal liver function test.  CT abdomen and pelvis confirmed acute pancreatic inflammation with areas of hypoenhancement consistent with necrosis of the neck and body of the pancreas.  Patient has been treated with IV fluids, IV morphine, supportive care.   Assessment & Plan:   Principal Problem:   Acute pancreatitis with uninfected necrosis Active Problems:   Essential hypertension   Mixed hyperlipidemia   Coronary artery disease involving native heart without angina pectoris   1-Acute Pancreatitis with necrosis: -Alcohol cessation discussed, plan to continue to  hold Lyrica and evolocumab at discharge.  -Patient presented with abdominal pain, lipase level 8000, CT abdomen pelvis show hyperenhancement consistent with necrosis of the neck and body of the pancreas. -He has received supportive care, IV fluids and IV morphine. -He has been  tolerating a clear liquid diet.  Plan to advance diet to full liquid diet. -We will start oral pain medication as needed, to try to transition from IV to oral. -White blood cell fluctuates, but overall lower than a few days ago. Afebrile/  CRP continues to decrease. -IgG4: Pending -Repeated CT abdomen and pelvis 8/20: Acute pancreatitis no drainable fluid collection/abscess or pseudocyst.  Small bilateral pleural effusion with partial compressive atelectasis lower lobe.  2-Constipation; He had small BM yesterday.  He has required enema x3 during this hospitalization. Continue with Senokot, will   scheduled MiraLAX.  3-Hypertension: Continue with metoprolol and Avapro.  4-Hyponatremia; Change IV fluids.  5-SIRS: Due to pancreatitis without evidence of infection. Afebrile.  Hypokalemia: Resolved supplementation with IV fluids. Bilirubinemia: Follow trend  CAD status post CABG 2014, hypertension hyperlipidemia Continue with aspirin and metoprolol and eprosartan Holding Repatha Peripheral neuropathy: Followed by Neurology Plan to [facility] for [facility] GERD: Change PPI to twice daily continue as needed Tums.  Estimated body mass index is 27.67 kg/m as calculated from the following:   Height as of this encounter: '5\' 8"'$  (1.727 m).   Weight as of this encounter: 82.6 kg.   DVT prophylaxis: Lovenox Code Status: Full code Family Communication: Discussed with patient Disposition Plan:  Status is: Inpatient  Remains inpatient appropriate because:IV treatments appropriate due to intensity of illness or inability to take PO  Dispo: The patient is from: Home              Anticipated d/c is to: Home              Patient currently is not medically stable to d/c.  Plan to discharge home when he is able to tolerate regular diet   Difficult to place patient No        Consultants:  None  Procedures:  None  Antimicrobials:  None  Subjective: He report feeling better, today is the first day that his feels this   well.  He denies  abdominal pain after he eats.  He has been tolerating clear liquid diet.  We discussed about advancing diet to full liquid diet.  Of note he has been eating a small amount.,  He does not have an appetite. He rate  abdominal pain 3/10 I encourage him to ambulate and sit recliner, out of bed.   Objective: Vitals:   08/13/21 1331 08/13/21 2053 08/14/21 0545 08/14/21 0847  BP: (!) 168/87 (!) 167/86 (!) 155/79 (!) 158/91  Pulse: 80 88 91 (!) 105  Resp: 18  18   Temp: 98.9 F (37.2 C) 99.8 F (37.7 C) 99.1 F (37.3 C)   TempSrc: Oral Oral Oral    SpO2: 95% 95% 96% 95%  Weight:      Height:        Intake/Output Summary (Last 24 hours) at 08/14/2021 1427 Last data filed at 08/14/2021 1200 Gross per 24 hour  Intake 1463.3 ml  Output 1250 ml  Net 213.3 ml   Filed Weights   08/07/21 1044  Weight: 82.6 kg    Examination:  General exam: Appears calm and comfortable  Respiratory system: Clear to auscultation. Respiratory effort normal. Cardiovascular system: S1 & S2 heard, RRR. No JVD, murmurs, rubs, gallops or clicks. No pedal edema. Gastrointestinal system: Abdomen is soft, mild distend, obese, mild tender, no rigidity.   Central nervous system: Alert and oriented.  Extremities: Symmetric 5 x 5 power.   Data Reviewed: I have personally reviewed following labs and imaging studies  CBC: Recent Labs  Lab 08/08/21 0727 08/08/21 1911 08/09/21 0541 08/10/21 0615 08/12/21 0640 08/13/21 0552 08/14/21 0544  WBC 20.3*   < > 20.7* 16.8* 14.0* 14.0* 15.1*  NEUTROABS 17.8*  --   --   --  11.0* 10.1*  --   HGB 17.1*   < > 15.1 14.1 11.9* 12.0* 12.1*  HCT 51.4   < > 45.9 44.1 35.9* 36.2* 36.8*  MCV 87.0   < > 87.9 88.4 88.6 85.8 86.8  PLT 194   < > 133* 141* 167 199 230   < > = values in this interval not displayed.   Basic Metabolic Panel: Recent Labs  Lab 08/08/21 0727 08/08/21 1911 08/09/21 0541 08/10/21 0615 08/12/21 0640 08/13/21 0552 08/14/21 0544  NA 138   < > 138 136 135 135 132*  K 4.6   < > 3.8 3.1* 3.3* 3.4* 3.6  CL 101   < > 100 97* 98 97* 97*  CO2 27   < > '29 28 28 27 26  '$ GLUCOSE 161*   < > 108* 108* 102* 121* 111*  BUN 25*   < > '21 18 16 13 10  '$ CREATININE 1.37*   < > 1.03 0.86 0.78 0.73 0.71  CALCIUM 8.8*   < > 8.2* 8.3* 7.9* 8.0* 8.0*  MG 1.7  --   --   --   --   --   --    < > = values in this interval not displayed.   GFR: Estimated Creatinine Clearance: 87.5 mL/min (by C-G formula based on SCr of 0.71 mg/dL). Liver Function Tests: Recent Labs  Lab 08/08/21 0727 08/08/21 1911  08/09/21 0541 08/10/21 0615 08/12/21 0640 08/13/21 0552  AST 39 43* 46* 46* 36  --   ALT '23 22 22 24 '$ 33  --   ALKPHOS 58 54 51 62 60  --   BILITOT 1.6* 2.0* 2.2* 1.8* 2.1* 2.1*  PROT 7.1 6.7 6.1* 6.4* 5.6*  --   ALBUMIN 4.1 3.9 3.3* 3.2* 2.8*  --    No results for input(s): LIPASE, AMYLASE in the last 168 hours. No results for input(s): AMMONIA in the last 168 hours. Coagulation Profile: No results for input(s): INR, PROTIME in the  last 168 hours. Cardiac Enzymes: No results for input(s): CKTOTAL, CKMB, CKMBINDEX, TROPONINI in the last 168 hours. BNP (last 3 results) No results for input(s): PROBNP in the last 8760 hours. HbA1C: No results for input(s): HGBA1C in the last 72 hours. CBG: No results for input(s): GLUCAP in the last 168 hours. Lipid Profile: No results for input(s): CHOL, HDL, LDLCALC, TRIG, CHOLHDL, LDLDIRECT in the last 72 hours. Thyroid Function Tests: No results for input(s): TSH, T4TOTAL, FREET4, T3FREE, THYROIDAB in the last 72 hours. Anemia Panel: No results for input(s): VITAMINB12, FOLATE, FERRITIN, TIBC, IRON, RETICCTPCT in the last 72 hours. Sepsis Labs: Recent Labs  Lab 08/07/21 1744  LATICACIDVEN 4.0*    Recent Results (from the past 240 hour(s))  Resp Panel by RT-PCR (Flu A&B, Covid) Nasopharyngeal Swab     Status: None   Collection Time: 08/07/21  2:14 PM   Specimen: Nasopharyngeal Swab; Nasopharyngeal(NP) swabs in vial transport medium  Result Value Ref Range Status   SARS Coronavirus 2 by RT PCR NEGATIVE NEGATIVE Final    Comment: (NOTE) SARS-CoV-2 target nucleic acids are NOT DETECTED.  The SARS-CoV-2 RNA is generally detectable in upper respiratory specimens during the acute phase of infection. The lowest concentration of SARS-CoV-2 viral copies this assay can detect is 138 copies/mL. A negative result does not preclude SARS-Cov-2 infection and should not be used as the sole basis for treatment or other patient management decisions.  A negative result may occur with  improper specimen collection/handling, submission of specimen other than nasopharyngeal swab, presence of viral mutation(s) within the areas targeted by this assay, and inadequate number of viral copies(<138 copies/mL). A negative result must be combined with clinical observations, patient history, and epidemiological information. The expected result is Negative.  Fact Sheet for Patients:  EntrepreneurPulse.com.au  Fact Sheet for Healthcare Providers:  IncredibleEmployment.be  This test is no t yet approved or cleared by the [place] FDA and  has been authorized for detection and/or diagnosis of SARS-CoV-2 by FDA under an Emergency Use Authorization (EUA). This EUA will remain  in effect (meaning this test can be used) for the duration of the COVID-19 declaration under Section 564(b)(1) of the Act, 21 U.S.C.section 360bbb-3(b)(1), unless the authorization is terminated  or revoked sooner.       Influenza A by PCR NEGATIVE NEGATIVE Final   Influenza B by PCR NEGATIVE NEGATIVE Final    Comment: (NOTE) The Xpert Xpress SARS-CoV-2/FLU/RSV plus assay is intended as an aid in the diagnosis of influenza from Nasopharyngeal swab specimens and should not be used as a sole basis for treatment. Nasal washings and aspirates are unacceptable for Xpert Xpress SARS-CoV-2/FLU/RSV testing.  Fact Sheet for Patients: EntrepreneurPulse.com.au  Fact Sheet for Healthcare Providers: IncredibleEmployment.be  This test is not yet approved or cleared by the [place] FDA and has been authorized for detection and/or diagnosis of SARS-CoV-2 by FDA under an Emergency Use Authorization (EUA). This EUA will remain in effect (meaning this test can be used) for the duration of the COVID-19 declaration under Section 564(b)(1) of the Act, 21 U.S.C. section 360bbb-3(b)(1), unless the authorization  is terminated or revoked.  Performed at [facility], [address], [place], [place] [address]   Culture, blood (routine x 2)     Status: None   Collection Time: 08/07/21  5:44 PM   Specimen: BLOOD LEFT HAND  Result Value Ref Range Status   Specimen Description BLOOD LEFT HAND  Final   Special Requests   Final  BOTTLES DRAWN AEROBIC AND ANAEROBIC Blood Culture adequate volume   Culture   Final    NO GROWTH 5 DAYS Performed at [facility], [address] [place], [place] [address]    Report Status 08/12/2021 FINAL  Final  Culture, blood (routine x 2)     Status: None   Collection Time: 08/07/21  5:44 PM   Specimen: BLOOD RIGHT HAND  Result Value Ref Range Status   Specimen Description BLOOD RIGHT HAND  Final   Special Requests   Final    BOTTLES DRAWN AEROBIC AND ANAEROBIC Blood Culture adequate volume   Culture   Final    NO GROWTH 5 DAYS Performed at [facility], [address], [place], [place] [address]    Report Status 08/12/2021 FINAL  Final  Culture, blood (routine x 2)     Status: None   Collection Time: 08/08/21  7:11 PM   Specimen: BLOOD  Result Value Ref Range Status   Specimen Description BLOOD BLOOD RIGHT HAND  Final   Special Requests   Final    BOTTLES DRAWN AEROBIC AND ANAEROBIC Blood Culture adequate volume   Culture   Final    NO GROWTH 5 DAYS Performed at [facility], [address] [place], [place] [address]    Report Status 08/13/2021 FINAL  Final  Culture, blood (routine x 2)     Status: None   Collection Time: 08/08/21  7:11 PM   Specimen: Right Antecubital; Blood  Result Value Ref Range Status   Specimen Description RIGHT ANTECUBITAL  Final   Special Requests   Final    BOTTLES DRAWN AEROBIC AND ANAEROBIC Blood Culture adequate volume   Culture   Final    NO GROWTH 5 DAYS Performed at [facility], [address] [place], [place] [address]    Report Status 08/13/2021 FINAL  Final         Radiology Studies: No results  found.      Scheduled Meds:  aspirin  81 mg Oral Daily   enoxaparin (LOVENOX) injection  40 mg Subcutaneous Q24H   irbesartan  37.5 mg Oral Daily   metoprolol tartrate  12.5 mg Oral q morning   pantoprazole  40 mg Oral BID   polyethylene glycol  17 g Oral Daily   senna-docusate  2 tablet Oral BID   Continuous Infusions:  0.9 % NaCl with KCl 20 mEq / L       LOS: 7 days    Time spent: 35 minutes.     Elmarie Shiley, MD [facility]   If 7PM-7AM, please contact night-coverage [email or web address]  08/14/2021, 2:27 PM

## 2021-08-15 ENCOUNTER — Inpatient Hospital Stay (HOSPITAL_COMMUNITY): Payer: PPO

## 2021-08-15 ENCOUNTER — Encounter (HOSPITAL_COMMUNITY): Payer: Self-pay | Admitting: Internal Medicine

## 2021-08-15 DIAGNOSIS — K8591 Acute pancreatitis with uninfected necrosis, unspecified: Principal | ICD-10-CM

## 2021-08-15 LAB — CBC
HCT: 38 % — ABNORMAL LOW (ref 39.0–52.0)
Hemoglobin: 12.4 g/dL — ABNORMAL LOW (ref 13.0–17.0)
MCH: 28.2 pg (ref 26.0–34.0)
MCHC: 32.6 g/dL (ref 30.0–36.0)
MCV: 86.4 fL (ref 80.0–100.0)
Platelets: 275 10*3/uL (ref 150–400)
RBC: 4.4 MIL/uL (ref 4.22–5.81)
RDW: 13.3 % (ref 11.5–15.5)
WBC: 17.5 10*3/uL — ABNORMAL HIGH (ref 4.0–10.5)
nRBC: 0 % (ref 0.0–0.2)

## 2021-08-15 LAB — URINALYSIS, ROUTINE W REFLEX MICROSCOPIC
Bacteria, UA: NONE SEEN
Bilirubin Urine: NEGATIVE
Glucose, UA: NEGATIVE mg/dL
Ketones, ur: 20 mg/dL — AB
Leukocytes,Ua: NEGATIVE
Nitrite: NEGATIVE
Protein, ur: NEGATIVE mg/dL
Specific Gravity, Urine: 1.013 (ref 1.005–1.030)
pH: 6 (ref 5.0–8.0)

## 2021-08-15 LAB — BASIC METABOLIC PANEL
Anion gap: 11 (ref 5–15)
BUN: 10 mg/dL (ref 8–23)
CO2: 27 mmol/L (ref 22–32)
Calcium: 8.2 mg/dL — ABNORMAL LOW (ref 8.9–10.3)
Chloride: 96 mmol/L — ABNORMAL LOW (ref 98–111)
Creatinine, Ser: 0.8 mg/dL (ref 0.61–1.24)
GFR, Estimated: >60 mL/min (ref >60)
Glucose, Bld: 116 mg/dL — ABNORMAL HIGH (ref 70–99)
Potassium: 3.6 mmol/L (ref 3.5–5.1)
Sodium: 134 mmol/L — ABNORMAL LOW (ref 135–145)

## 2021-08-15 LAB — IGG 4: IgG, Subclass 4: 30 mg/dL (ref 2–96)

## 2021-08-15 LAB — C-REACTIVE PROTEIN: CRP: 15.3 mg/dL — ABNORMAL HIGH (ref <1.0)

## 2021-08-15 MED ORDER — IOHEXOL 350 MG/ML SOLN
80.0000 mL | Freq: Once | INTRAVENOUS | Status: AC | PRN
  Administered 2021-08-15: 80 mL via INTRAVENOUS

## 2021-08-15 NOTE — Consult Note (Signed)
Referring Provider: Elmarie Shiley, MD Primary Care Physician:  Asencion Noble, MD Primary Gastroenterologist:  Hildred Laser, MD Date of admission August 07, 2021 Date of consultation August 15, 2021  Reason for Consultation: Acute pancreatitis, worsening leukocytosis.  Do we need to repeat CT abdomen.  Thanks.  Also should we start IV antibiotics for necrotizing pancreatitis.  HPI: [name] is a 70 y.o. male with history of coronary artery disease, hypertension, polyneuropathy, hyperlipidemia who presented to the ED August 17 with complaints of nausea, vomiting, abdominal pain.  GI has been consulted for worsening leukocytosis with history of necrotizing pancreatitis, with question of need to repeat CT or start IV antibiotics.  In the ED on August 17, patient evaluated with CT abdomen pelvis with contrast that revealed acute pancreatitis with mild gland hypoenhancement of the neck and body of the pancreas concerning for necrosis.  No notable gallstones or biliary dilation.  Lipase was 8989.  Mild leukocytosis of 12,100 at that time.  Abdominal ultrasound completed with CBD of 6.2 mm.  No gallstones.  Likely adenomyomatosis.  Fatty liver.  Repeat CT abdomen pelvis with contrast on August 20 with small bilateral pleural effusions with partial compressive atelectasis of the lower lobes, pneumonia not excluded.  High attenuating content within the gallbladder may represent sludge or small stones or vicarious excretion of contrast.  Diffuse inflammatory changes of the pancreas, no drainable fluid collection or abscess, no pseudocyst.  Areas of hypoenhancement primarily involving the head and uncinate process of the pancreas may be due to edema or early necrosis.  Indeterminate 15 mm left adrenal nodule.  Etiology of pancreatitis unclear.  Triglycerides 110 on admission.  Calcium normal.  Patient denies significant alcohol consumption. Typically 3-4 bourbon drinks per week. Questionable gallbladder  sludge or small stones noted on second CT. One new medication listed was Lyrica within the last couple of months.  Attending recommending to hold both Lyrica and evolocumab due to rare association with pancreatitis.  Blood cultures have been negative.  Total bilirubin has been mildly elevated but mostly indirect.  AST minimally elevated at the highest 46.  Back to normal at 36 several days ago.  IgG4 subclass 4 was 30.  White blood cell count initially climbed to 20,700 dropping down to 14,002 days ago.  Back up at 17,500 today.  CRP has been monitored initially 26.2, down to 15.3 today.  Chest x-ray pending today.  Urinalysis with small hemoglobin, 20 ketones.  Culture pending.  Patient states he has never been diagnosed with pancreatitis in the past. Back in May while on vacation, he developed mild abdominal pain with vomiting after eating tacos and have 3-4 margaritas. Typically doesn't have stomach issues. BMs typically regular. No melena, brbpr. Takes OTC omeprazole if planning to eat out due to heartburn. No dysphagia. No unintentional weight loss.   Today patient states he is feeling better. Each day his pain is improved. He complains of feeling bloating. He has frequent hiccups and belching. No further vomiting. Has transitioned from morphine to oxycodone over the last 24 hours. Having difficulty with BMs but has had loose stool today. No melena, brbpr.    Prior to Admission medications   Medication Sig Start Date End Date Taking? Authorizing Provider  acetaminophen (TYLENOL) 325 MG tablet Take 650 mg by mouth daily as needed for moderate pain or headache.   Yes [provider]  aspirin 81 MG tablet Take 1 tablet (81 mg total) by mouth daily. 03/12/17  Yes [name], Mechele Dawley,  MD  irbesartan (AVAPRO) 75 MG tablet Take 37.5 mg by mouth daily.   Yes [provider]  metoprolol tartrate (LOPRESSOR) 25 MG tablet Take 12.5 mg by mouth every morning.   Yes [provider]   Multiple Vitamins-Minerals (MULTIVITAMIN WITH MINERALS) tablet Take 1 tablet by mouth daily.   Yes [provider]  pregabalin (LYRICA) 50 MG capsule TAKE ONE CAPSULE ('50MG'$  TOTAL) BY MOUTH TWO TIMES DAILY Patient taking differently: Take 50 mg by mouth 2 (two) times daily. 08/06/21  Yes Lomax, Amy, NP  REPATHA SURECLICK [id number] MG/ML SOAJ INJECT 1 DOSE INTO THE SKIN EVERY 14 DAYS. Patient taking differently: Inject 140 mg into the muscle every 14 (fourteen) days. 11/05/20  Yes Hilty, Nadean Corwin, MD  sildenafil (REVATIO) 20 MG tablet Take 40-100 mg by mouth daily as needed (erectile dysfunction).   Yes [provider]  valACYclovir (VALTREX) 1000 MG tablet Take 1,000 mg by mouth daily. 04/22/21  Yes [provider]    Current Facility-Administered Medications  Medication Dose Route Frequency Provider Last Rate Last Admin   0.9 % NaCl with KCl 20 mEq/ L  infusion   Intravenous Continuous Regalado, Belkys A, MD 50 mL/hr at 08/14/21 1530 New Bag at 08/14/21 1530   acetaminophen (TYLENOL) tablet 650 mg  650 mg Oral Q6H PRN Shalhoub, Sherryll Burger, MD       Or   acetaminophen (TYLENOL) suppository 650 mg  650 mg Rectal Q6H PRN Shalhoub, Sherryll Burger, MD       aspirin chewable tablet 81 mg  81 mg Oral Daily Shalhoub, Sherryll Burger, MD   81 mg at 08/15/21 0805   bisacodyl (DULCOLAX) suppository 10 mg  10 mg Rectal Daily PRN Patrecia Pour, MD       calcium carbonate (TUMS - dosed in mg elemental calcium) chewable tablet 200-400 mg of elemental calcium  1-2 tablet Oral TID PRN Patrecia Pour, MD   400 mg of elemental calcium at 08/13/21 1827   enoxaparin (LOVENOX) injection 40 mg  40 mg Subcutaneous Q24H Vernelle Emerald, MD   40 mg at 08/14/21 2116   irbesartan (AVAPRO) tablet 37.5 mg  37.5 mg Oral Daily Vernelle Emerald, MD   37.5 mg at 08/15/21 0805   metoprolol tartrate (LOPRESSOR) tablet 12.5 mg  12.5 mg Oral q morning Vernelle Emerald, MD   12.5 mg at 08/15/21 [id number]   morphine 2 MG/ML  injection 2-6 mg  2-6 mg Intravenous Q2H PRN Vance Gather B, MD   4 mg at 08/14/21 1256   ondansetron (ZOFRAN) injection 4 mg  4 mg Intravenous Q6H PRN Vernelle Emerald, MD   4 mg at 08/07/21 1717   oxyCODONE (Oxy IR/ROXICODONE) immediate release tablet 5 mg  5 mg Oral Q6H PRN Regalado, Belkys A, MD   5 mg at 08/15/21 0805   pantoprazole (PROTONIX) EC tablet 40 mg  40 mg Oral BID Regalado, Belkys A, MD   40 mg at 08/15/21 0805   polyethylene glycol (MIRALAX / GLYCOLAX) packet 17 g  17 g Oral Daily Regalado, Belkys A, MD   17 g at 08/15/21 0804   senna-docusate (Senokot-S) tablet 2 tablet  2 tablet Oral BID Patrecia Pour, MD   2 tablet at 08/15/21 0805   sodium phosphate (FLEET) 7-19 GM/118ML enema 1 enema  1 enema Rectal Daily PRN Patrecia Pour, MD   1 enema at 08/12/21 1838    Allergies as of 08/07/2021 - Review Complete 08/07/2021  Allergen Reaction Noted   Gabapentin Other (See Comments) 11/05/2020    Past Medical History:  Diagnosis Date   Coronary artery disease    a. s/p CABG in 2014 with LIMA-LAD and SVG-D1 - performed in [place]   Fracture, ribs    Heart disease    Hyperlipidemia    Hypertension    Peripheral neuropathy     Past Surgical History:  Procedure Laterality Date   CATARACT EXTRACTION W/PHACO Right 02/15/2021   Procedure: CATARACT EXTRACTION PHACO AND INTRAOCULAR LENS PLACEMENT ([place]);  Surgeon: Baruch Goldmann, MD;  Location: [facility];  Service: Ophthalmology;  Laterality: Right;  CDE 8.65   CATARACT EXTRACTION W/PHACO Left 03/04/2021   Procedure: CATARACT EXTRACTION PHACO AND INTRAOCULAR LENS PLACEMENT ([facility]);  Surgeon: Baruch Goldmann, MD;  Location: [facility];  Service: Ophthalmology;  Laterality: Left;  CDE: 8.15   COLONOSCOPY N/A 03/11/2017   [name]: 2 sessile serrated polyps removed, next colonoscopy in 5 years   CORONARY ARTERY BYPASS GRAFT  2014   HERNIA REPAIR  2010   POLYPECTOMY  03/11/2017   Procedure: POLYPECTOMY;  Surgeon: Rogene Houston, MD;  Location: [facility];  Service: Endoscopy;;  colon   ROTATOR CUFF REPAIR Right 2014   vericous veins  2001    Family History  Problem Relation Age of Onset   Heart disease Father    Dementia Sister    Thyroid disease Sister    Hypertension Sister    Stroke Mother     Social History   Socioeconomic History   Marital status: Single    Spouse name: Not on file   Number of children: 2   Years of education: 16   Highest education level: Not on file  Occupational History    Comment: retired  Tobacco Use   Smoking status: Never   Smokeless tobacco: Never  Vaping Use   Vaping Use: Never used  Substance and Sexual Activity   Alcohol use: Yes    Alcohol/week: 0.0 standard drinks    Comment: Bourbon on the weekends x 4   Drug use: No   Sexual activity: Not on file  Other Topics Concern   Not on file  Social History Narrative   Lives with sig other   caffeine-  A little   Social Determinants of Health   Financial Resource Strain: Not on file  Food Insecurity: Not on file  Transportation Needs: Not on file  Physical Activity: Not on file  Stress: Not on file  Social Connections: Not on file  Intimate Partner Violence: Not on file     ROS:  General: Negative for anorexia, weight loss, fever, chills, fatigue, weakness. See hpi Eyes: Negative for vision changes.  ENT: Negative for hoarseness, difficulty swallowing, nasal congestion. CV: Negative for chest pain, angina, palpitations, dyspnea on exertion, peripheral edema.  Respiratory: Negative for dyspnea at rest, dyspnea on exertion, cough, sputum, wheezing.  GI: See history of present illness. GU:  Negative for dysuria, hematuria, urinary incontinence, urinary frequency, nocturnal urination.  MS: Negative for joint pain, low back pain.  Derm: Negative for rash or itching.  Neuro: Negative for weakness, abnormal sensation, seizure, frequent headaches, memory loss, confusion.  Psych: Negative for anxiety, depression, suicidal  ideation, hallucinations.  Endo: Negative for unusual weight change.  Heme: Negative for bruising or bleeding. Allergy: Negative for rash or hives.       Physical Examination: Vital signs in last 24 hours: Temp:  [98.2 F (36.8 C)-99.5 F (37.5 C)] 98.3 F (  36.8 C) (08/25 0326) Pulse Rate:  [90-107] 107 (08/25 0800) Resp:  [20] 20 (08/25 0800) BP: (154-175)/(84-92) 154/92 (08/25 0800) SpO2:  [94 %-95 %] 95 % (08/25 0326) Last BM Date: 08/14/21  General: Well-nourished, well-developed in no acute distress.  Head: Normocephalic, atraumatic.   Eyes: Conjunctiva pink, no icterus. Mouth: Oropharyngeal mucosa moist and pink , no lesions erythema or exudate. Neck: Supple without thyromegaly, masses, or lymphadenopathy.  Lungs: Clear to auscultation bilaterally.  Heart: Regular rate and rhythm, no murmurs rubs or gallops.  Abdomen: Bowel sounds are normal, nontender, nondistended, no hepatosplenomegaly or masses, no abdominal bruits or    hernia , no rebound or guarding.   Rectal: not performed Extremities: No lower extremity edema, clubbing, deformity.  Neuro: Alert and oriented x 4 , grossly normal neurologically.  Skin: Warm and dry, no rash or jaundice.   Psych: Alert and cooperative, normal mood and affect.        Intake/Output from previous day: 08/24 0701 - 08/25 0700 In: 3268.8 [P.O.:1360; I.V.:1908.8] Out: 1450 [Urine:1450] Intake/Output this shift: Total I/O In: -  Out: 550 [Urine:550]  Lab Results: CBC Recent Labs    08/13/21 0552 08/14/21 0544 08/15/21 0605  WBC 14.0* 15.1* 17.5*  HGB 12.0* 12.1* 12.4*  HCT 36.2* 36.8* 38.0*  MCV 85.8 86.8 86.4  PLT 199 230 275   BMET Recent Labs    08/13/21 0552 08/14/21 0544 08/15/21 0605  NA 135 132* 134*  K 3.4* 3.6 3.6  CL 97* 97* 96*  CO2 '27 26 27  '$ GLUCOSE 121* 111* 116*  BUN '13 10 10  '$ CREATININE 0.73 0.71 0.80  CALCIUM 8.0* 8.0* 8.2*   LFT Recent Labs    08/13/21 0552  BILITOT 2.1*  BILIDIR 0.8*   IBILI 1.3*    Lipase No results for input(s): LIPASE in the last 72 hours.  PT/INR No results for input(s): LABPROT, INR in the last 72 hours.    Imaging Studies: CT ABDOMEN PELVIS W CONTRAST  Result Date: 08/10/2021 CLINICAL DATA:  Abdominal pain.  Recent diagnosis of pancreatitis. EXAM: CT ABDOMEN AND PELVIS WITH CONTRAST TECHNIQUE: Multidetector CT imaging of the abdomen and pelvis was performed using the standard protocol following bolus administration of intravenous contrast. CONTRAST:  14m OMNIPAQUE IOHEXOL 350 MG/ML SOLN COMPARISON:  CT of the abdomen pelvis dated 08/07/2021. FINDINGS: Lower chest: Small bilateral pleural effusions with partial compressive atelectasis of the lower lobes, new since the prior CT. Pneumonia is not excluded. Clinical correlation is recommended. There is advanced coronary vascular calcification. No intra-abdominal free air. Small ascites, increased since the prior CT. Hepatobiliary: The liver is unremarkable. High attenuating content within the gallbladder may represent sludge or small stones or vicarious excretion of contrast. Pancreas: Diffuse inflammatory changes of the pancreas in keeping with known pancreatitis. No drainable fluid collection/abscess or pseudocyst. Areas of hypoenhancement primarily involving the head and uncinate process of the pancreas may be due to edema or early necrosis. Spleen: Normal in size without focal abnormality. Adrenals/Urinary Tract: Indeterminate 15 mm left adrenal nodule. The right adrenal gland is unremarkable. There is no hydronephrosis on either side. There is symmetric enhancement and excretion of contrast by both kidneys. The visualized ureters and urinary bladder appear unremarkable. Stomach/Bowel: There is no bowel obstruction or active inflammation. The appendix is normal. Vascular/Lymphatic: Moderate aortoiliac atherosclerotic disease. The IVC is unremarkable. No portal venous gas. There is no adenopathy. Reproductive:  Mildly enlarged prostate gland measuring 5 cm in transverse axial diameter. The seminal vesicles  are symmetric. Other: Mild subcutaneous edema. Musculoskeletal: Mild degenerative changes. No acute osseous pathology. IMPRESSION: 1. Acute pancreatitis. No drainable fluid collection/abscess or pseudocyst. 2. Small bilateral pleural effusions with partial compressive atelectasis of the lower lobes, new since the prior CT. Pneumonia is not excluded. Clinical correlation is recommended. 3. Small ascites, increased since the prior CT. 4. No bowel obstruction. Normal appendix. 5. Aortic Atherosclerosis (ICD10-I70.0). Electronically Signed   By: Anner Crete M.D.   On: 08/10/2021 19:38   CT ABDOMEN PELVIS W CONTRAST  Result Date: 08/07/2021 CLINICAL DATA:  Left lower quadrant abdominal pain EXAM: CT ABDOMEN AND PELVIS WITH CONTRAST TECHNIQUE: Multidetector CT imaging of the abdomen and pelvis was performed using the standard protocol following bolus administration of intravenous contrast. CONTRAST:  1m OMNIPAQUE IOHEXOL 350 MG/ML SOLN COMPARISON:  None. FINDINGS: Lower chest: Small hiatal hernia.  No acute abnormality. Hepatobiliary: No focal liver abnormality is seen. No gallstones, gallbladder wall thickening, or biliary dilatation. Pancreas: Inflammatory change and trace fluid seen about the pancreas. Mild gland hypoenhancement of the neck/body. Small volume fluid tracking into the right greater than left paracolic gutters. Spleen: Normal in size without focal abnormality. Adrenals/Urinary Tract: Adrenal glands are unremarkable. Kidneys are normal, without renal calculi, focal lesion, or hydronephrosis. Bladder is unremarkable. Stomach/Bowel: Stomach is within normal limits. Appendix appears normal. No evidence of bowel wall thickening, distention, or inflammatory changes. Vascular/Lymphatic: No significant vascular findings are present. No enlarged abdominal or pelvic lymph nodes. Reproductive: Prostatomegaly,  measuring up to 5.2 cm. Other: No abdominal wall hernia or abnormality. No abdominopelvic ascites. Musculoskeletal: No acute or significant osseous findings. IMPRESSION: Findings compatible with acute pancreatitis.Mild gland hypoenhancement of the neck/body of the pancreas, concerning for necrosis. Electronically Signed   By: LYetta GlassmanM.D.   On: 08/07/2021 14:32   DG Abdomen Acute W/Chest  Result Date: 08/07/2021 CLINICAL DATA:  Abdominal pain EXAM: DG ABDOMEN ACUTE WITH 1 VIEW CHEST COMPARISON:  None. FINDINGS: Chest: The cardiomediastinal silhouette is within normal limits. There is no focal consolidation or pulmonary edema. There is no pleural effusion or pneumothorax. Median sternotomy wires are noted. Abdomen: There is a paucity of bowel gas with no evidence of mechanical obstruction. There is no evidence of free intraperitoneal air. There is no abnormal soft tissue calcification. Suture anchors are noted in the right humeral head. There is a mildly displaced fracture of the right lateral ninth rib. IMPRESSION: 1. Paucity of bowel gas with no evidence of mechanical obstruction. 2. Age-indeterminate mildly displaced fracture of the right lateral ninth rib. Correlate with point tenderness. Electronically Signed   By: PValetta MoleM.D.   On: 08/07/2021 12:45   [place]Abdomen Limited RUQ (LIVER/GB)  Result Date: 08/07/2021 CLINICAL DATA:  History of acute pancreatitis on recent CT EXAM: ULTRASOUND ABDOMEN LIMITED RIGHT UPPER QUADRANT COMPARISON:  CT from earlier in the same day. FINDINGS: Gallbladder: Gallbladder is well distended without evidence of wall thickening or pericholecystic fluid. No cholelithiasis is noted. Ring down artifact is noted in the anterior wall of the gallbladder likely related adenomyomatosis Common bile duct: Diameter: 6.2 mm within normal limits for the patient's given age. Liver: Mild increase in echogenicity is noted. No focal mass is noted. Portal vein is patent on color  Doppler imaging with normal direction of blood flow towards the liver. Other: None. IMPRESSION: Fatty liver. Adenomyomatosis of the gallbladder. No acute abnormality is noted. Electronically Signed   By: MInez CatalinaM.D.   On: 08/07/2021 18:21  [4 week]  Impression: Pleasant 70 year old male admitted with acute pancreatitis 8 days ago.  First episode that he is fully aware of.  Similar but much milder symptoms back in May in the setting of few margaritas and tacos.  GI asked to consult due to rising white blood cell count and concern if he needed IV antibiotics or repeat imaging to exclude infected necrosis.  Etiology of pancreatitis has not been clearly determined.  He has had 2 CT scans and an ultrasound with no clear cholelithiasis.  Second CT mention possible gallbladder sludge versus small stones versus vicarious secretion of contrast.  Only new medication, Lyrica.  IgG4 subclass 4 normal.  Triglycerides normal.  No evidence of hypercalcemia.  Query microlithiasis as a cause of symptoms back in May as well as current pancreatitis.  Less likely underlying malignancy.  Overall his leukocytosis has improved since admission with slight bump over the last 24 hours.  Clinically he is improving with less abdominal pain, tolerating meals.  Afebrile over the past 48 hours.  His biggest complaint is of bloating, hiccups and constipation likely aggravated due to acute pancreatitis and narcotic use.    Plan: At this time would continue to follow patient clinically as he does appear to be improving.  If increasing abdominal pain, fever, continued increasing leukocytosis, could consider repeat CT imaging. Continue supportive measures for constipation, agree with MiraLAX 17 g at least once daily can increase to twice daily if needed. Follow-up pending chest x-ray.  We would like to thank you for the opportunity to participate in the care of Nyquan Brasington.  Laureen Ochs. Bernarda Caffey [facility] [phone] 8/25/20221:07 PM    LOS: 8 days

## 2021-08-15 NOTE — Evaluation (Signed)
Physical Therapy Evaluation Patient Details Name: [name] MRN: [id number] DOB: 1951-07-02 Today's Date: 08/15/2021   History of Present Illness  70 year old male with past medical history of coronary artery disease (S/P CABG 2014), hypertension, peripheral neuropathy (follows with [facility]), hyperlipidemia who presents to [facility] emergency department with complaints of nausea vomiting and abdominal pain.    Patient explains that on Monday he began to experience a general sense of malaise and intermittent lightheadedness.  The symptoms persisted over the following 48 hours.  On Wednesday morning the patient proceeded to mow the lawn and shortly after beginning to mow the lawn he began to feel worsening lightheadedness and tingling of the bilateral upper extremities.  Patient also began to exhibit episodes of loose stool.    Patient decided to present to [facility] emergency room for evaluation due to the symptoms but upon arrival patient began to experience rapidly worsening nausea and vomited several times while in the waiting room.  This was followed by rather rapid onset of severe epigastric pain.  Pain is dull in quality, severe in intensity and nonradiating.  Pain is worse with movement without any alleviating factors.  Patient denies any associated fevers, dysuria, low back pain.  Patient denies any sick contacts, recent travel, recent ingestion of undercooked food or recent confirmed contact with COVID-19 infection.     Patient denies heavy alcohol use, drinking 1-2 beers once or twice weekly.  Patient denies being placed on any new prescription medications with exception of being initiated on Lyrica several months ago by his neurologist for peripheral neuropathy.  Patient has no history of gallbladder disease and denies having a pattern of epigastric pain after eating fatty meals.  Patient denies any illicit drug use.   Clinical Impression  Patient functioning near  baseline for functional mobility and gait other than c/o mild stomach pain.  Patient demonstrates good return for ambulation in room and hallways and encouraged to ambulate with nursing staff and family members as tolerated for length of stay.  Plan:  Patient discharged from physical therapy to care of nursing for ambulation daily as tolerated for length of stay.      Follow Up Recommendations No PT follow up    Equipment Recommendations  None recommended by PT    Recommendations for Other Services       Precautions / Restrictions Precautions Precautions: None Restrictions Weight Bearing Restrictions: No      Mobility  Bed Mobility Overal bed mobility: Independent                  Transfers Overall transfer level: Modified independent                  Ambulation/Gait Ambulation/Gait assistance: Modified independent (Device/Increase time) Gait Distance (Feet): 100 Feet Assistive device: None Gait Pattern/deviations: WFL(Within Functional Limits) Gait velocity: decreased   General Gait Details: slightly labored cadence without loss of balance with good return for ambulation in room and hallway without loss of balance  Stairs            Wheelchair Mobility    Modified Rankin (Stroke Patients Only)       Balance Overall balance assessment: No apparent balance deficits (not formally assessed)                                           Pertinent Vitals/Pain  Pain Assessment: 0-10 Pain Score: 3  Pain Location: stomach Pain Descriptors / Indicators: Aching Pain Intervention(s): Limited activity within patient's tolerance;Monitored during session    [place] expects to be discharged to:: Private residence Living Arrangements: Spouse/significant other Available Help at Discharge: Family;Available 24 hours/day Type of Home: House Home Access: Stairs to enter Entrance Stairs-Rails: None Entrance Stairs-Number of  Steps: 3 Home Layout: One level Home Equipment: Cane - single point      Prior Function Level of Independence: Independent         Comments: Hydrographic surveyor, drives     Journalist, newspaper        Extremity/Trunk Assessment   Upper Extremity Assessment Upper Extremity Assessment: Overall WFL for tasks assessed    Lower Extremity Assessment Lower Extremity Assessment: Overall WFL for tasks assessed    Cervical / Trunk Assessment Cervical / Trunk Assessment: Normal  Communication   Communication: No difficulties  Cognition Arousal/Alertness: Awake/alert Behavior During Therapy: WFL for tasks assessed/performed Overall Cognitive Status: Within Functional Limits for tasks assessed                                        General Comments      Exercises     Assessment/Plan    PT Assessment Patent does not need any further PT services  PT Problem List         PT Treatment Interventions      PT Goals (Current goals can be found in the Care Plan section)  Acute Rehab PT Goals Patient Stated Goal: return home with family to assist PT Goal Formulation: With patient Time For Goal Achievement: 08/15/21 Potential to Achieve Goals: Good    Frequency     Barriers to discharge        Co-evaluation               AM-PAC PT "6 Clicks" Mobility  Outcome Measure Help needed turning from your back to your side while in a flat bed without using bedrails?: None Help needed moving from lying on your back to sitting on the side of a flat bed without using bedrails?: None Help needed moving to and from a bed to a chair (including a wheelchair)?: None Help needed standing up from a chair using your arms (e.g., wheelchair or bedside chair)?: None Help needed to walk in hospital room?: None Help needed climbing 3-5 steps with a railing? : None 6 Click Score: 24    End of Session   Activity Tolerance: Patient tolerated treatment well;Patient limited  by fatigue Patient left: in bed;with call bell/phone within reach Nurse Communication: Mobility status PT Visit Diagnosis: Unsteadiness on feet (R26.81);Other abnormalities of gait and mobility (R26.89);Muscle weakness (generalized) (M62.81)    Time: [id number] PT Time Calculation (min) (ACUTE ONLY): 15 min   Charges:   PT Evaluation $PT Eval Low Complexity: 1 Low PT Treatments $Therapeutic Activity: 8-22 mins        3:30 PM, 08/15/21 Lonell Grandchild, MPT Physical Therapist with [facility] [phone] office [phone] mobile phone

## 2021-08-15 NOTE — Progress Notes (Signed)
PROGRESS NOTE    [name]  [id number] DOB: 05-26-1951 DOA: 08/07/2021 PCP: Asencion Noble, MD   Brief Narrative: 70 year old with past medical history significant for CAD status post CABG 2014, hypertension, HLD on evolocumab, peripheral neuropathy who presents to the ED 8/17 with complaints of malaise, fatigue for a couple of days, severe abdominal pain and several episodes of vomiting while in the ED.  He was found to have lipase level 8989 with normal liver function test.  CT abdomen and pelvis confirmed acute pancreatic inflammation with areas of hypoenhancement consistent with necrosis of the neck and body of the pancreas.  Patient has been treated with IV fluids, IV morphine, supportive care.   Assessment & Plan:   Principal Problem:   Acute pancreatitis with uninfected necrosis Active Problems:   Essential hypertension   Mixed hyperlipidemia   Coronary artery disease involving native heart without angina pectoris   1-Acute Pancreatitis with necrosis: -Alcohol cessation discussed, plan to continue to  hold Lyrica and evolocumab at discharge.  -Patient presented with abdominal pain, lipase level 8000, CT abdomen pelvis show hyperenhancement consistent with necrosis of the neck and body of the pancreas. -IgG4: 30 normal range.  -Repeated CT abdomen and pelvis 8/20: Acute pancreatitis no drainable fluid collection/abscess or pseudocyst.  Small bilateral pleural effusion with partial compressive atelectasis lower lobe. -He report improvement of abdominal pain, continue to required pain medications, WBC increasing today, still tachycardic. Will consult GI for further evaluation. -Discussed with Dr Laural Golden, he recommend getting CT scan.   2-Constipation; He has required enema x3 during this hospitalization. Continue with Senokot, MiraLAX. He had a good BM this am.   3-Hypertension: Continue with metoprolol and Avapro.  4-Hyponatremia; Change IV fluids.  5-SIRS: Due to  pancreatitis without evidence of infection. Afebrile. WBC increasing. Chest x ray negative for infection, Blood culture ordered, UA , urine culture ordered.   Hypokalemia: Resolved supplementation with IV fluids. Bilirubinemia: Follow trend  CAD status post CABG 2014, hypertension hyperlipidemia Continue with aspirin and metoprolol and eprosartan Holding Repatha Peripheral neuropathy: Followed by Neurology Plan to [facility] GERD: Change PPI to twice daily continue as needed Tums. BL pleural effusion, small. Decrease IV fluids.   Estimated body mass index is 27.67 kg/m as calculated from the following:   Height as of this encounter: '5\' 8"'$  (1.727 m).   Weight as of this encounter: 82.6 kg.   DVT prophylaxis: Lovenox Code Status: Full code Family Communication: Discussed with patient Disposition Plan:  Status is: Inpatient  Remains inpatient appropriate because:IV treatments appropriate due to intensity of illness or inability to take PO  Dispo: The patient is from: Home              Anticipated d/c is to: Home              Patient currently is not medically stable to d/c.  Plan to discharge home when he is able to tolerate regular diet   Difficult to place patient No        Consultants:  None  Procedures:  None  Antimicrobials:  None  Subjective: He report abdominal pain is getting better,he had good BM this am, he felt better after that.   Objective: Vitals:   08/14/21 1509 08/14/21 2020 08/15/21 0326 08/15/21 0800  BP: (!) 175/84 (!) 166/92 (!) 160/90 (!) 154/92  Pulse: 90 99 94 (!) 107  Resp:  '20 20 20  '$ Temp: 98.2 F (36.8 C) 99.5 F (37.5 C) 98.3  F (36.8 C)   TempSrc: Oral Oral    SpO2: 94% 94% 95%   Weight:      Height:        Intake/Output Summary (Last 24 hours) at 08/15/2021 1328 Last data filed at 08/15/2021 0724 Gross per 24 hour  Intake 1325.51 ml  Output 1550 ml  Net -224.49 ml    Filed Weights   08/07/21 1044  Weight:  82.6 kg    Examination:  General exam: NAD Respiratory system: CTA Cardiovascular system: S 1, S 2 RRR Gastrointestinal system: BS present, soft, mild distended.  Central nervous system: Alert, oriented Extremities: symmetric power.    Data Reviewed: I have personally reviewed following labs and imaging studies  CBC: Recent Labs  Lab 08/10/21 0615 08/12/21 0640 08/13/21 0552 08/14/21 0544 08/15/21 0605  WBC 16.8* 14.0* 14.0* 15.1* 17.5*  NEUTROABS  --  11.0* 10.1*  --   --   HGB 14.1 11.9* 12.0* 12.1* 12.4*  HCT 44.1 35.9* 36.2* 36.8* 38.0*  MCV 88.4 88.6 85.8 86.8 86.4  PLT 141* 167 199 230 [id number]    Basic Metabolic Panel: Recent Labs  Lab 08/10/21 0615 08/12/21 0640 08/13/21 0552 08/14/21 0544 08/15/21 0605  NA 136 135 135 132* 134*  K 3.1* 3.3* 3.4* 3.6 3.6  CL 97* 98 97* 97* 96*  CO2 '28 28 27 26 27  '$ GLUCOSE 108* 102* 121* 111* 116*  BUN '18 16 13 10 10  '$ CREATININE 0.86 0.78 0.73 0.71 0.80  CALCIUM 8.3* 7.9* 8.0* 8.0* 8.2*    GFR: Estimated Creatinine Clearance: 87.5 mL/min (by C-G formula based on SCr of 0.8 mg/dL). Liver Function Tests: Recent Labs  Lab 08/08/21 1911 08/09/21 0541 08/10/21 0615 08/12/21 0640 08/13/21 0552  AST 43* 46* 46* 36  --   ALT '22 22 24 '$ 33  --   ALKPHOS 54 51 62 60  --   BILITOT 2.0* 2.2* 1.8* 2.1* 2.1*  PROT 6.7 6.1* 6.4* 5.6*  --   ALBUMIN 3.9 3.3* 3.2* 2.8*  --     No results for input(s): LIPASE, AMYLASE in the last 168 hours. No results for input(s): AMMONIA in the last 168 hours. Coagulation Profile: No results for input(s): INR, PROTIME in the last 168 hours. Cardiac Enzymes: No results for input(s): CKTOTAL, CKMB, CKMBINDEX, TROPONINI in the last 168 hours. BNP (last 3 results) No results for input(s): PROBNP in the last 8760 hours. HbA1C: No results for input(s): HGBA1C in the last 72 hours. CBG: No results for input(s): GLUCAP in the last 168 hours. Lipid Profile: No results for input(s): CHOL, HDL,  LDLCALC, TRIG, CHOLHDL, LDLDIRECT in the last 72 hours. Thyroid Function Tests: No results for input(s): TSH, T4TOTAL, FREET4, T3FREE, THYROIDAB in the last 72 hours. Anemia Panel: No results for input(s): VITAMINB12, FOLATE, FERRITIN, TIBC, IRON, RETICCTPCT in the last 72 hours. Sepsis Labs: No results for input(s): PROCALCITON, LATICACIDVEN in the last 168 hours.   Recent Results (from the past 240 hour(s))  Resp Panel by RT-PCR (Flu A&B, Covid) Nasopharyngeal Swab     Status: None   Collection Time: 08/07/21  2:14 PM   Specimen: Nasopharyngeal Swab; Nasopharyngeal(NP) swabs in vial transport medium  Result Value Ref Range Status   SARS Coronavirus 2 by RT PCR NEGATIVE NEGATIVE Final    Comment: (NOTE) SARS-CoV-2 target nucleic acids are NOT DETECTED.  The SARS-CoV-2 RNA is generally detectable in upper respiratory specimens during the acute phase of infection. The lowest concentration of SARS-CoV-2 viral copies  this assay can detect is 138 copies/mL. A negative result does not preclude SARS-Cov-2 infection and should not be used as the sole basis for treatment or other patient management decisions. A negative result may occur with  improper specimen collection/handling, submission of specimen other than nasopharyngeal swab, presence of viral mutation(s) within the areas targeted by this assay, and inadequate number of viral copies(<138 copies/mL). A negative result must be combined with clinical observations, patient history, and epidemiological information. The expected result is Negative.  Fact Sheet for Patients:  EntrepreneurPulse.com.au  Fact Sheet for Healthcare Providers:  IncredibleEmployment.be  This test is no t yet approved or cleared by the [place] FDA and  has been authorized for detection and/or diagnosis of SARS-CoV-2 by FDA under an Emergency Use Authorization (EUA). This EUA will remain  in effect (meaning this test  can be used) for the duration of the COVID-19 declaration under Section 564(b)(1) of the Act, 21 U.S.C.section 360bbb-3(b)(1), unless the authorization is terminated  or revoked sooner.       Influenza A by PCR NEGATIVE NEGATIVE Final   Influenza B by PCR NEGATIVE NEGATIVE Final    Comment: (NOTE) The Xpert Xpress SARS-CoV-2/FLU/RSV plus assay is intended as an aid in the diagnosis of influenza from Nasopharyngeal swab specimens and should not be used as a sole basis for treatment. Nasal washings and aspirates are unacceptable for Xpert Xpress SARS-CoV-2/FLU/RSV testing.  Fact Sheet for Patients: EntrepreneurPulse.com.au  Fact Sheet for Healthcare Providers: IncredibleEmployment.be  This test is not yet approved or cleared by the [place] FDA and has been authorized for detection and/or diagnosis of SARS-CoV-2 by FDA under an Emergency Use Authorization (EUA). This EUA will remain in effect (meaning this test can be used) for the duration of the COVID-19 declaration under Section 564(b)(1) of the Act, 21 U.S.C. section 360bbb-3(b)(1), unless the authorization is terminated or revoked.  Performed at [facility], [address] [place], [place] [address]   Culture, blood (routine x 2)     Status: None   Collection Time: 08/07/21  5:44 PM   Specimen: BLOOD LEFT HAND  Result Value Ref Range Status   Specimen Description BLOOD LEFT HAND  Final   Special Requests   Final    BOTTLES DRAWN AEROBIC AND ANAEROBIC Blood Culture adequate volume   Culture   Final    NO GROWTH 5 DAYS Performed at [facility], [address] [place], [place] [address]    Report Status 08/12/2021 FINAL  Final  Culture, blood (routine x 2)     Status: None   Collection Time: 08/07/21  5:44 PM   Specimen: BLOOD RIGHT HAND  Result Value Ref Range Status   Specimen Description BLOOD RIGHT HAND  Final   Special Requests   Final    BOTTLES DRAWN AEROBIC AND  ANAEROBIC Blood Culture adequate volume   Culture   Final    NO GROWTH 5 DAYS Performed at [facility], [address], [place], [place] [address]    Report Status 08/12/2021 FINAL  Final  Culture, blood (routine x 2)     Status: None   Collection Time: 08/08/21  7:11 PM   Specimen: BLOOD  Result Value Ref Range Status   Specimen Description BLOOD BLOOD RIGHT HAND  Final   Special Requests   Final    BOTTLES DRAWN AEROBIC AND ANAEROBIC Blood Culture adequate volume   Culture   Final    NO GROWTH 5 DAYS Performed at [facility],  [address], [place], [place] [address]    Report Status 08/13/2021 FINAL  Final  Culture, blood (routine x 2)     Status: None   Collection Time: 08/08/21  7:11 PM   Specimen: Right Antecubital; Blood  Result Value Ref Range Status   Specimen Description RIGHT ANTECUBITAL  Final   Special Requests   Final    BOTTLES DRAWN AEROBIC AND ANAEROBIC Blood Culture adequate volume   Culture   Final    NO GROWTH 5 DAYS Performed at [facility], [address], [place], [place] [address]    Report Status 08/13/2021 FINAL  Final  Culture, blood (routine x 2)     Status: None (Preliminary result)   Collection Time: 08/15/21  8:41 AM   Specimen: BLOOD  Result Value Ref Range Status   Specimen Description BLOOD RIGHT ANTECUBITAL  Final   Special Requests   Final    BOTTLES DRAWN AEROBIC AND ANAEROBIC Blood Culture adequate volume Performed at [facility], [address], [place], [place] [address]    Culture PENDING  Incomplete   Report Status PENDING  Incomplete  Culture, blood (routine x 2)     Status: None (Preliminary result)   Collection Time: 08/15/21  8:42 AM   Specimen: BLOOD  Result Value Ref Range Status   Specimen Description BLOOD LEFT ANTECUBITAL  Final   Special Requests   Final    BOTTLES DRAWN AEROBIC AND ANAEROBIC Blood Culture adequate volume Performed at [facility], [address], [place], [place] [address]    Culture PENDING   Incomplete   Report Status PENDING  Incomplete          Radiology Studies: DG Chest 2 View  Result Date: 08/15/2021 CLINICAL DATA:  Pancreatitis EXAM: CHEST - 2 VIEW COMPARISON:  None. FINDINGS: Midline sternotomy. Small RIGHT effusion and trace LEFT effusion. No pulmonary edema. No pneumothorax. Pneumonia IMPRESSION: Bilateral small effusions, RIGHT greater than LEFT. Electronically Signed   By: Suzy Bouchard M.D.   On: 08/15/2021 13:14        Scheduled Meds:  aspirin  81 mg Oral Daily   enoxaparin (LOVENOX) injection  40 mg Subcutaneous Q24H   irbesartan  37.5 mg Oral Daily   metoprolol tartrate  12.5 mg Oral q morning   pantoprazole  40 mg Oral BID   polyethylene glycol  17 g Oral Daily   senna-docusate  2 tablet Oral BID   Continuous Infusions:  0.9 % NaCl with KCl 20 mEq / L 50 mL/hr at 08/14/21 1530     LOS: 8 days    Time spent: 35 minutes.     Elmarie Shiley, MD [facility]   If 7PM-7AM, please contact night-coverage [email or web address]  08/15/2021, 1:28 PM

## 2021-08-16 ENCOUNTER — Telehealth: Payer: Self-pay | Admitting: Gastroenterology

## 2021-08-16 LAB — COMPREHENSIVE METABOLIC PANEL
ALT: 60 U/L — ABNORMAL HIGH (ref 0–44)
AST: 55 U/L — ABNORMAL HIGH (ref 15–41)
Albumin: 2.7 g/dL — ABNORMAL LOW (ref 3.5–5.0)
Alkaline Phosphatase: 93 U/L (ref 38–126)
Anion gap: 9 (ref 5–15)
BUN: 10 mg/dL (ref 8–23)
CO2: 28 mmol/L (ref 22–32)
Calcium: 8.3 mg/dL — ABNORMAL LOW (ref 8.9–10.3)
Chloride: 98 mmol/L (ref 98–111)
Creatinine, Ser: 0.76 mg/dL (ref 0.61–1.24)
GFR, Estimated: >60 mL/min (ref >60)
Glucose, Bld: 129 mg/dL — ABNORMAL HIGH (ref 70–99)
Potassium: 3.6 mmol/L (ref 3.5–5.1)
Sodium: 135 mmol/L (ref 135–145)
Total Bilirubin: 0.9 mg/dL (ref 0.3–1.2)
Total Protein: 6.1 g/dL — ABNORMAL LOW (ref 6.5–8.1)

## 2021-08-16 LAB — CBC WITH DIFFERENTIAL/PLATELET
Abs Immature Granulocytes: 1.03 10*3/uL — ABNORMAL HIGH (ref 0.00–0.07)
Basophils Absolute: 0.1 10*3/uL (ref 0.0–0.1)
Basophils Relative: 1 %
Eosinophils Absolute: 0.3 10*3/uL (ref 0.0–0.5)
Eosinophils Relative: 1 %
HCT: 36.3 % — ABNORMAL LOW (ref 39.0–52.0)
Hemoglobin: 11.9 g/dL — ABNORMAL LOW (ref 13.0–17.0)
Immature Granulocytes: 5 %
Lymphocytes Relative: 6 %
Lymphs Abs: 1.1 10*3/uL (ref 0.7–4.0)
MCH: 28.3 pg (ref 26.0–34.0)
MCHC: 32.8 g/dL (ref 30.0–36.0)
MCV: 86.4 fL (ref 80.0–100.0)
Monocytes Absolute: 1 10*3/uL (ref 0.1–1.0)
Monocytes Relative: 5 %
Neutro Abs: 15.9 10*3/uL — ABNORMAL HIGH (ref 1.7–7.7)
Neutrophils Relative %: 82 %
Platelets: 310 10*3/uL (ref 150–400)
RBC: 4.2 MIL/uL — ABNORMAL LOW (ref 4.22–5.81)
RDW: 13.3 % (ref 11.5–15.5)
WBC: 19.4 10*3/uL — ABNORMAL HIGH (ref 4.0–10.5)
nRBC: 0 % (ref 0.0–0.2)

## 2021-08-16 LAB — C-REACTIVE PROTEIN: CRP: 11.3 mg/dL — ABNORMAL HIGH (ref <1.0)

## 2021-08-16 MED ORDER — PANTOPRAZOLE SODIUM 40 MG PO TBEC: 40.0000 mg | DELAYED_RELEASE_TABLET | Freq: Two times a day (BID) | ORAL | 0 refills | Status: DC

## 2021-08-16 MED ORDER — BACLOFEN 5 MG PO TABS: 5.0000 mg | ORAL_TABLET | Freq: Two times a day (BID) | ORAL | 0 refills | Status: DC | PRN

## 2021-08-16 MED ORDER — POLYETHYLENE GLYCOL 3350 17 G PO PACK: 17.0000 g | PACK | Freq: Every day | ORAL | 0 refills | Status: DC

## 2021-08-16 MED ORDER — BACLOFEN 10 MG PO TABS
5.0000 mg | ORAL_TABLET | Freq: Two times a day (BID) | ORAL | Status: DC | PRN
  Administered 2021-08-16: 5 mg via ORAL
  Filled 2021-08-16: qty 1

## 2021-08-16 MED ORDER — OXYCODONE HCL 5 MG PO TABS: 5.0000 mg | ORAL_TABLET | Freq: Four times a day (QID) | ORAL | 0 refills | Status: DC | PRN

## 2021-08-16 MED ORDER — SENNOSIDES-DOCUSATE SODIUM 8.6-50 MG PO TABS: 2.0000 | ORAL_TABLET | Freq: Two times a day (BID) | ORAL | 0 refills | Status: DC

## 2021-08-16 NOTE — Discharge Summary (Signed)
Physician Discharge Summary  [name] [id number] DOB: 01-06-1951 DOA: 08/07/2021  PCP: Asencion Noble, MD  Admit date: 08/07/2021 Discharge date: 08/16/2021  Admitted From: Home  Disposition: Home   Recommendations for Outpatient Follow-up:  Follow up with PCP in 1-2 weeks Please obtain BMP/CBC in one week Needs Follow up with Dr Laural Golden In  2 weeks.      Discharge Condition: Stable.  CODE STATUS: Full Code Diet recommendation: Heart Healthy   Brief/Interim Summary: 70 year old with past medical history significant for CAD status post CABG 2014, hypertension, HLD on evolocumab, peripheral neuropathy who presents to the ED 8/17 with complaints of malaise, fatigue for a couple of days, severe abdominal pain and several episodes of vomiting while in the ED.  He was found to have lipase level 8989 with normal liver function test.  CT abdomen and pelvis confirmed acute pancreatic inflammation with areas of hypoenhancement consistent with necrosis of the neck and body of the pancreas.   Patient has been treated with IV fluids, IV morphine, supportive care.     1-Acute Pancreatitis with necrosis: -Alcohol cessation discussed, plan to continue to  hold Lyrica and evolocumab at discharge.  -Patient presented with abdominal pain, lipase level 8000, CT abdomen pelvis show hyperenhancement consistent with necrosis of the neck and body of the pancreas. -IgG4: 30 normal range.  -Repeated CT abdomen and pelvis 8/20: Acute pancreatitis no drainable fluid collection/abscess or pseudocyst.  Small bilateral pleural effusion with partial compressive atelectasis lower lobe. -He report improvement of abdominal pain, continue to required pain medications, , -Appreciate GI evaluation.  -Discussed with Dr Laural Golden 8/25, he recommend getting CT scan.   -CT scan was review by GI and Dr Thornton Papas Radiologist. CT scan appears stable, some what improved. Patient improving clinically. Plan to discharge home on low fat  diet.   2-Constipation; He has required enema x3 during this hospitalization. Continue with Senokot, MiraLAX. Having B<    3-Hypertension: Continue with metoprolol and Avapro.   4-Hyponatremia; Change IV fluids.  5-SIRS: Due to pancreatitis without evidence of infection. Afebrile. WBC increasing. Chest x ray negative for infection, Blood culture ordered, UA negative for infection.  Leukocytosis likely reactive. No evidence of infection.   Hypokalemia: Resolved supplementation with IV fluids. Bilirubinemia: Follow trend   CAD status post CABG 2014, hypertension hyperlipidemia Continue with aspirin and metoprolol and eprosartan Holding Repatha Peripheral neuropathy: Followed by Neurology Plan to [facility] for Now GERD: Change PPI to twice daily continue as needed Tums. BL pleural effusion, small. No hypoxemia.    Estimated body mass index is 27.67 kg/m as calculated from the following:   Height as of this encounter: '5\' 8"'$  (1.727 m).   Weight as of this encounter: 82.6 kg.      Discharge Diagnoses:  Principal Problem:   Acute pancreatitis with uninfected necrosis Active Problems:   Essential hypertension   Mixed hyperlipidemia   Coronary artery disease involving native heart without angina pectoris    Discharge Instructions  Discharge Instructions     Increase activity slowly   Complete by: As directed       Allergies as of 08/16/2021       Reactions   Gabapentin Other (See Comments)   fatigue        Medication List     STOP taking these medications    pregabalin 50 MG capsule Commonly known as: LYRICA   Repatha SureClick [id number] MG/ML Soaj Generic drug: Evolocumab   sildenafil 20 MG tablet Commonly known as:  REVATIO   valACYclovir 1000 MG tablet Commonly known as: VALTREX       TAKE these medications    acetaminophen 325 MG tablet Commonly known as: TYLENOL Take 650 mg by mouth daily as needed for moderate pain or headache.   aspirin 81  MG tablet Take 1 tablet (81 mg total) by mouth daily.   irbesartan 75 MG tablet Commonly known as: AVAPRO Take 37.5 mg by mouth daily.   metoprolol tartrate 25 MG tablet Commonly known as: LOPRESSOR Take 12.5 mg by mouth every morning.   multivitamin with minerals tablet Take 1 tablet by mouth daily.   oxyCODONE 5 MG immediate release tablet Commonly known as: Oxy IR/ROXICODONE Take 1 tablet (5 mg total) by mouth every 6 (six) hours as needed for moderate pain.   pantoprazole 40 MG tablet Commonly known as: PROTONIX Take 1 tablet (40 mg total) by mouth 2 (two) times daily.   polyethylene glycol 17 g packet Commonly known as: MIRALAX / GLYCOLAX Take 17 g by mouth daily. Start taking on: August 17, 2021   senna-docusate 8.6-50 MG tablet Commonly known as: Senokot-S Take 2 tablets by mouth 2 (two) times daily.        Follow-up Information     [name], Mechele Dawley, MD Follow up in 2 week(s).   Specialty: Gastroenterology Contact information: [address], [address] [place] [address] [phone]         Asencion Noble, MD Follow up in 1 week(s).   Specialty: Internal Medicine Contact information: [address] [place] [address] [phone]         Herminio Commons, MD .   Specialty: Cardiology Contact information: [place] [address] [phone]                Allergies  Allergen Reactions   Gabapentin Other (See Comments)    fatigue    Consultations: GI   Procedures/Studies: DG Chest 2 View  Result Date: 08/15/2021 CLINICAL DATA:  Pancreatitis EXAM: CHEST - 2 VIEW COMPARISON:  None. FINDINGS: Midline sternotomy. Small RIGHT effusion and trace LEFT effusion. No pulmonary edema. No pneumothorax. Pneumonia IMPRESSION: Bilateral small effusions, RIGHT greater than LEFT. Electronically Signed   By: Suzy Bouchard M.D.   On: 08/15/2021 13:14   CT ABDOMEN PELVIS W CONTRAST  Result Date: 08/15/2021 CLINICAL  DATA:  Pancreatitis, epigastric pain radiating to back EXAM: CT ABDOMEN AND PELVIS WITH CONTRAST TECHNIQUE: Multidetector CT imaging of the abdomen and pelvis was performed using the standard protocol following bolus administration of intravenous contrast. CONTRAST:  82m OMNIPAQUE IOHEXOL 350 MG/ML SOLN COMPARISON:  08/10/2021 FINDINGS: Lower chest: There are trace bilateral pleural effusions and bilateral lower lobe atelectasis, unchanged since prior exam. Hepatobiliary: Decreased high attenuation material within the gallbladder compatible with decreased gallbladder sludge. No evidence of calcified gallstones or cholecystitis. Liver is unremarkable. Pancreas: There is worsening edema throughout the pancreas, with marked decreased enhancement of the pancreatic parenchyma within the head and body, compatible with acute necrotizing pancreatitis. No fluid collection, pseudocyst, or abscess at this time. Spleen: Normal in size without focal abnormality. Adrenals/Urinary Tract: Stable 1.2 cm indeterminate left adrenal nodule, statistically likely an adenoma. Right adrenals unremarkable. The kidneys enhance normally and symmetrically. No urinary tract calculi or obstructive uropathy. The bladder is unremarkable. Stomach/Bowel: There is segmental dilation of the proximal jejunum with scattered gas fluid levels, likely ileus. No evidence of high-grade obstruction. No bowel wall thickening or inflammatory change. Vascular/Lymphatic: Stable atherosclerosis  of the aorta and its branches. There is mild extrinsic compression upon the confluence of the SMV and splenic vein, though the structures remain patent. The portal vein is unremarkable. No pathologic adenopathy within the abdomen or pelvis. Reproductive: Prostate is unremarkable. Other: Small volume ascites throughout the abdomen and pelvis, stable since prior study. No free intraperitoneal gas. No abdominal wall hernia. Musculoskeletal: No acute or destructive bony  lesions. Reconstructed images demonstrate no additional findings. IMPRESSION: 1. Worsening inflammatory changes of the pancreas, with decreasing parenchymal enhancement within the pancreatic head and body. Findings are compatible with progressive acute necrotizing pancreatitis. No fluid collection, pseudocyst, or abscess at this time. 2. Small volume ascites, stable. 3. Extrinsic compression upon the confluence of the SMV and splenic vein. These vessels as well as the portal vein remain patent. 4. Stable small bilateral pleural effusions and bibasilar atelectasis. 5. Segmental dilation with gas fluid levels in the mid jejunum compatible with ileus. No evidence of high-grade obstruction. 6.  Aortic Atherosclerosis (ICD10-I70.0). Electronically Signed   By: Randa Ngo M.D.   On: 08/15/2021 17:07   CT ABDOMEN PELVIS W CONTRAST  Result Date: 08/10/2021 CLINICAL DATA:  Abdominal pain.  Recent diagnosis of pancreatitis. EXAM: CT ABDOMEN AND PELVIS WITH CONTRAST TECHNIQUE: Multidetector CT imaging of the abdomen and pelvis was performed using the standard protocol following bolus administration of intravenous contrast. CONTRAST:  67m OMNIPAQUE IOHEXOL 350 MG/ML SOLN COMPARISON:  CT of the abdomen pelvis dated 08/07/2021. FINDINGS: Lower chest: Small bilateral pleural effusions with partial compressive atelectasis of the lower lobes, new since the prior CT. Pneumonia is not excluded. Clinical correlation is recommended. There is advanced coronary vascular calcification. No intra-abdominal free air. Small ascites, increased since the prior CT. Hepatobiliary: The liver is unremarkable. High attenuating content within the gallbladder may represent sludge or small stones or vicarious excretion of contrast. Pancreas: Diffuse inflammatory changes of the pancreas in keeping with known pancreatitis. No drainable fluid collection/abscess or pseudocyst. Areas of hypoenhancement primarily involving the head and uncinate  process of the pancreas may be due to edema or early necrosis. Spleen: Normal in size without focal abnormality. Adrenals/Urinary Tract: Indeterminate 15 mm left adrenal nodule. The right adrenal gland is unremarkable. There is no hydronephrosis on either side. There is symmetric enhancement and excretion of contrast by both kidneys. The visualized ureters and urinary bladder appear unremarkable. Stomach/Bowel: There is no bowel obstruction or active inflammation. The appendix is normal. Vascular/Lymphatic: Moderate aortoiliac atherosclerotic disease. The IVC is unremarkable. No portal venous gas. There is no adenopathy. Reproductive: Mildly enlarged prostate gland measuring 5 cm in transverse axial diameter. The seminal vesicles are symmetric. Other: Mild subcutaneous edema. Musculoskeletal: Mild degenerative changes. No acute osseous pathology. IMPRESSION: 1. Acute pancreatitis. No drainable fluid collection/abscess or pseudocyst. 2. Small bilateral pleural effusions with partial compressive atelectasis of the lower lobes, new since the prior CT. Pneumonia is not excluded. Clinical correlation is recommended. 3. Small ascites, increased since the prior CT. 4. No bowel obstruction. Normal appendix. 5. Aortic Atherosclerosis (ICD10-I70.0). Electronically Signed   By: AAnner CreteM.D.   On: 08/10/2021 19:38   CT ABDOMEN PELVIS W CONTRAST  Result Date: 08/07/2021 CLINICAL DATA:  Left lower quadrant abdominal pain EXAM: CT ABDOMEN AND PELVIS WITH CONTRAST TECHNIQUE: Multidetector CT imaging of the abdomen and pelvis was performed using the standard protocol following bolus administration of intravenous contrast. CONTRAST:  856mOMNIPAQUE IOHEXOL 350 MG/ML SOLN COMPARISON:  None. FINDINGS: Lower chest: Small hiatal hernia.  No acute  abnormality. Hepatobiliary: No focal liver abnormality is seen. No gallstones, gallbladder wall thickening, or biliary dilatation. Pancreas: Inflammatory change and trace fluid seen  about the pancreas. Mild gland hypoenhancement of the neck/body. Small volume fluid tracking into the right greater than left paracolic gutters. Spleen: Normal in size without focal abnormality. Adrenals/Urinary Tract: Adrenal glands are unremarkable. Kidneys are normal, without renal calculi, focal lesion, or hydronephrosis. Bladder is unremarkable. Stomach/Bowel: Stomach is within normal limits. Appendix appears normal. No evidence of bowel wall thickening, distention, or inflammatory changes. Vascular/Lymphatic: No significant vascular findings are present. No enlarged abdominal or pelvic lymph nodes. Reproductive: Prostatomegaly, measuring up to 5.2 cm. Other: No abdominal wall hernia or abnormality. No abdominopelvic ascites. Musculoskeletal: No acute or significant osseous findings. IMPRESSION: Findings compatible with acute pancreatitis.Mild gland hypoenhancement of the neck/body of the pancreas, concerning for necrosis. Electronically Signed   By: Yetta Glassman M.D.   On: 08/07/2021 14:32   DG Abdomen Acute W/Chest  Result Date: 08/07/2021 CLINICAL DATA:  Abdominal pain EXAM: DG ABDOMEN ACUTE WITH 1 VIEW CHEST COMPARISON:  None. FINDINGS: Chest: The cardiomediastinal silhouette is within normal limits. There is no focal consolidation or pulmonary edema. There is no pleural effusion or pneumothorax. Median sternotomy wires are noted. Abdomen: There is a paucity of bowel gas with no evidence of mechanical obstruction. There is no evidence of free intraperitoneal air. There is no abnormal soft tissue calcification. Suture anchors are noted in the right humeral head. There is a mildly displaced fracture of the right lateral ninth rib. IMPRESSION: 1. Paucity of bowel gas with no evidence of mechanical obstruction. 2. Age-indeterminate mildly displaced fracture of the right lateral ninth rib. Correlate with point tenderness. Electronically Signed   By: Valetta Mole M.D.   On: 08/07/2021 12:45   US Abdomen  Limited RUQ (LIVER/GB)  Result Date: 08/07/2021 CLINICAL DATA:  History of acute pancreatitis on recent CT EXAM: ULTRASOUND ABDOMEN LIMITED RIGHT UPPER QUADRANT COMPARISON:  CT from earlier in the same day. FINDINGS: Gallbladder: Gallbladder is well distended without evidence of wall thickening or pericholecystic fluid. No cholelithiasis is noted. Ring down artifact is noted in the anterior wall of the gallbladder likely related adenomyomatosis Common bile duct: Diameter: 6.2 mm within normal limits for the patient's given age. Liver: Mild increase in echogenicity is noted. No focal mass is noted. Portal vein is patent on color Doppler imaging with normal direction of blood flow towards the liver. Other: None. IMPRESSION: Fatty liver. Adenomyomatosis of the gallbladder. No acute abnormality is noted. Electronically Signed   By: Inez Catalina M.D.   On: 08/07/2021 18:21     Subjective: He denies abdominal pain, requiring less pain medications.  Tolerating diet   Discharge Exam: Vitals:   08/16/21 0445 08/16/21 0804  BP: (!) 157/82 (!) 164/85  Pulse: 93 92  Resp: 18   Temp: 98.2 F (36.8 C)   SpO2: 95%      General: Pt is alert, awake, not in acute distress Cardiovascular: RRR, S1/S2 +, no rubs, no gallops Respiratory: CTA bilaterally, no wheezing, no rhonchi Abdominal: Soft, NT, ND, bowel sounds + Extremities: no edema, no cyanosis    The results of significant diagnostics from this hospitalization (including imaging, microbiology, ancillary and laboratory) are listed below for reference.     Microbiology: Recent Results (from the past 240 hour(s))  Resp Panel by RT-PCR (Flu A&B, Covid) Nasopharyngeal Swab     Status: None   Collection Time: 08/07/21  2:14 PM   Specimen:  Nasopharyngeal Swab; Nasopharyngeal(NP) swabs in vial transport medium  Result Value Ref Range Status   SARS Coronavirus 2 by RT PCR NEGATIVE NEGATIVE Final    Comment: (NOTE) SARS-CoV-2 target nucleic acids are  NOT DETECTED.  The SARS-CoV-2 RNA is generally detectable in upper respiratory specimens during the acute phase of infection. The lowest concentration of SARS-CoV-2 viral copies this assay can detect is 138 copies/mL. A negative result does not preclude SARS-Cov-2 infection and should not be used as the sole basis for treatment or other patient management decisions. A negative result may occur with  improper specimen collection/handling, submission of specimen other than nasopharyngeal swab, presence of viral mutation(s) within the areas targeted by this assay, and inadequate number of viral copies(<138 copies/mL). A negative result must be combined with clinical observations, patient history, and epidemiological information. The expected result is Negative.  Fact Sheet for Patients:  EntrepreneurPulse.com.au  Fact Sheet for Healthcare Providers:  IncredibleEmployment.be  This test is no t yet approved or cleared by the [place] FDA and  has been authorized for detection and/or diagnosis of SARS-CoV-2 by FDA under an Emergency Use Authorization (EUA). This EUA will remain  in effect (meaning this test can be used) for the duration of the COVID-19 declaration under Section 564(b)(1) of the Act, 21 U.S.C.section 360bbb-3(b)(1), unless the authorization is terminated  or revoked sooner.       Influenza A by PCR NEGATIVE NEGATIVE Final   Influenza B by PCR NEGATIVE NEGATIVE Final    Comment: (NOTE) The Xpert Xpress SARS-CoV-2/FLU/RSV plus assay is intended as an aid in the diagnosis of influenza from Nasopharyngeal swab specimens and should not be used as a sole basis for treatment. Nasal washings and aspirates are unacceptable for Xpert Xpress SARS-CoV-2/FLU/RSV testing.  Fact Sheet for Patients: EntrepreneurPulse.com.au  Fact Sheet for Healthcare Providers: IncredibleEmployment.be  This test is not  yet approved or cleared by the [place] FDA and has been authorized for detection and/or diagnosis of SARS-CoV-2 by FDA under an Emergency Use Authorization (EUA). This EUA will remain in effect (meaning this test can be used) for the duration of the COVID-19 declaration under Section 564(b)(1) of the Act, 21 U.S.C. section 360bbb-3(b)(1), unless the authorization is terminated or revoked.  Performed at [facility], [address], [place], [place] [address]   Culture, blood (routine x 2)     Status: None   Collection Time: 08/07/21  5:44 PM   Specimen: BLOOD LEFT HAND  Result Value Ref Range Status   Specimen Description BLOOD LEFT HAND  Final   Special Requests   Final    BOTTLES DRAWN AEROBIC AND ANAEROBIC Blood Culture adequate volume   Culture   Final    NO GROWTH 5 DAYS Performed at [facility], [address] [place], [place] [address]    Report Status 08/12/2021 FINAL  Final  Culture, blood (routine x 2)     Status: None   Collection Time: 08/07/21  5:44 PM   Specimen: BLOOD RIGHT HAND  Result Value Ref Range Status   Specimen Description BLOOD RIGHT HAND  Final   Special Requests   Final    BOTTLES DRAWN AEROBIC AND ANAEROBIC Blood Culture adequate volume   Culture   Final    NO GROWTH 5 DAYS Performed at [facility], [address], [place], [place] [address]    Report Status 08/12/2021 FINAL  Final  Culture, blood (routine x 2)     Status: None   Collection Time:  08/08/21  7:11 PM   Specimen: BLOOD  Result Value Ref Range Status   Specimen Description BLOOD BLOOD RIGHT HAND  Final   Special Requests   Final    BOTTLES DRAWN AEROBIC AND ANAEROBIC Blood Culture adequate volume   Culture   Final    NO GROWTH 5 DAYS Performed at [facility], [address], [place], [place] [address]    Report Status 08/13/2021 FINAL  Final  Culture, blood (routine x 2)     Status: None   Collection Time: 08/08/21  7:11 PM   Specimen: Right Antecubital; Blood  Result  Value Ref Range Status   Specimen Description RIGHT ANTECUBITAL  Final   Special Requests   Final    BOTTLES DRAWN AEROBIC AND ANAEROBIC Blood Culture adequate volume   Culture   Final    NO GROWTH 5 DAYS Performed at [facility], [address], [place], [place] [address]    Report Status 08/13/2021 FINAL  Final  Culture, blood (routine x 2)     Status: None (Preliminary result)   Collection Time: 08/15/21  8:41 AM   Specimen: BLOOD  Result Value Ref Range Status   Specimen Description BLOOD RIGHT ANTECUBITAL  Final   Special Requests   Final    BOTTLES DRAWN AEROBIC AND ANAEROBIC Blood Culture adequate volume   Culture   Final    NO GROWTH < 24 HOURS Performed at [facility], [address], [place], [place] [address]    Report Status PENDING  Incomplete  Culture, blood (routine x 2)     Status: None (Preliminary result)   Collection Time: 08/15/21  8:42 AM   Specimen: BLOOD  Result Value Ref Range Status   Specimen Description BLOOD LEFT ANTECUBITAL  Final   Special Requests   Final    BOTTLES DRAWN AEROBIC AND ANAEROBIC Blood Culture adequate volume   Culture   Final    NO GROWTH < 24 HOURS Performed at [facility], [address] [place], [place] [address]    Report Status PENDING  Incomplete     Labs: BNP (last 3 results) No results for input(s): BNP in the last 8760 hours. Basic Metabolic Panel: Recent Labs  Lab 08/12/21 0640 08/13/21 0552 08/14/21 0544 08/15/21 0605 08/16/21 0600  NA 135 135 132* 134* 135  K 3.3* 3.4* 3.6 3.6 3.6  CL 98 97* 97* 96* 98  CO2 '28 27 26 27 28  '$ GLUCOSE 102* 121* 111* 116* 129*  BUN '16 13 10 10 10  '$ CREATININE 0.78 0.73 0.71 0.80 0.76  CALCIUM 7.9* 8.0* 8.0* 8.2* 8.3*   Liver Function Tests: Recent Labs  Lab 08/10/21 0615 08/12/21 0640 08/13/21 0552 08/16/21 0600  AST 46* 36  --  55*  ALT 24 33  --  60*  ALKPHOS 62 60  --  93  BILITOT 1.8* 2.1* 2.1* 0.9  PROT 6.4* 5.6*  --  6.1*  ALBUMIN 3.2* 2.8*  --  2.7*   No  results for input(s): LIPASE, AMYLASE in the last 168 hours. No results for input(s): AMMONIA in the last 168 hours. CBC: Recent Labs  Lab 08/12/21 0640 08/13/21 0552 08/14/21 0544 08/15/21 0605 08/16/21 0600  WBC 14.0* 14.0* 15.1* 17.5* 19.4*  NEUTROABS 11.0* 10.1*  --   --  15.9*  HGB 11.9* 12.0* 12.1* 12.4* 11.9*  HCT 35.9* 36.2* 36.8* 38.0* 36.3*  MCV 88.6 85.8 86.8 86.4 86.4  PLT 167 199 230 275 310   Cardiac Enzymes: No  results for input(s): CKTOTAL, CKMB, CKMBINDEX, TROPONINI in the last 168 hours. BNP: Invalid input(s): POCBNP CBG: No results for input(s): GLUCAP in the last 168 hours. D-Dimer No results for input(s): DDIMER in the last 72 hours. Hgb A1c No results for input(s): HGBA1C in the last 72 hours. Lipid Profile No results for input(s): CHOL, HDL, LDLCALC, TRIG, CHOLHDL, LDLDIRECT in the last 72 hours. Thyroid function studies No results for input(s): TSH, T4TOTAL, T3FREE, THYROIDAB in the last 72 hours.  Invalid input(s): FREET3 Anemia work up No results for input(s): VITAMINB12, FOLATE, FERRITIN, TIBC, IRON, RETICCTPCT in the last 72 hours. Urinalysis    Component Value Date/Time   COLORURINE YELLOW 08/15/2021 0842   APPEARANCEUR CLEAR 08/15/2021 0842   LABSPEC 1.013 08/15/2021 0842   PHURINE 6.0 08/15/2021 0842   GLUCOSEU NEGATIVE 08/15/2021 0842   HGBUR SMALL (A) 08/15/2021 0842   BILIRUBINUR NEGATIVE 08/15/2021 0842   KETONESUR 20 (A) 08/15/2021 0842   PROTEINUR NEGATIVE 08/15/2021 0842   NITRITE NEGATIVE 08/15/2021 0842   LEUKOCYTESUR NEGATIVE 08/15/2021 0842   Sepsis Labs Invalid input(s): PROCALCITONIN,  WBC,  LACTICIDVEN Microbiology Recent Results (from the past 240 hour(s))  Resp Panel by RT-PCR (Flu A&B, Covid) Nasopharyngeal Swab     Status: None   Collection Time: 08/07/21  2:14 PM   Specimen: Nasopharyngeal Swab; Nasopharyngeal(NP) swabs in vial transport medium  Result Value Ref Range Status   SARS Coronavirus 2 by RT PCR  NEGATIVE NEGATIVE Final    Comment: (NOTE) SARS-CoV-2 target nucleic acids are NOT DETECTED.  The SARS-CoV-2 RNA is generally detectable in upper respiratory specimens during the acute phase of infection. The lowest concentration of SARS-CoV-2 viral copies this assay can detect is 138 copies/mL. A negative result does not preclude SARS-Cov-2 infection and should not be used as the sole basis for treatment or other patient management decisions. A negative result may occur with  improper specimen collection/handling, submission of specimen other than nasopharyngeal swab, presence of viral mutation(s) within the areas targeted by this assay, and inadequate number of viral copies(<138 copies/mL). A negative result must be combined with clinical observations, patient history, and epidemiological information. The expected result is Negative.  Fact Sheet for Patients:  EntrepreneurPulse.com.au  Fact Sheet for Healthcare Providers:  [employer]  This test is no t yet approved or cleared by the [place] FDA and  has been authorized for detection and/or diagnosis of SARS-CoV-2 by FDA under an Emergency Use Authorization (EUA). This EUA will remain  in effect (meaning this test can be used) for the duration of the COVID-19 declaration under Section 564(b)(1) of the Act, 21 U.S.C.section 360bbb-3(b)(1), unless the authorization is terminated  or revoked sooner.       Influenza A by PCR NEGATIVE NEGATIVE Final   Influenza B by PCR NEGATIVE NEGATIVE Final    Comment: (NOTE) The Xpert Xpress SARS-CoV-2/FLU/RSV plus assay is intended as an aid in the diagnosis of influenza from Nasopharyngeal swab specimens and should not be used as a sole basis for treatment. Nasal washings and aspirates are unacceptable for Xpert Xpress SARS-CoV-2/FLU/RSV testing.  Fact Sheet for Patients: [employer]repreneur[employer]ulse[employer]com.au  Fact Sheet for  Healthcare Providers: [employer]  This test is not yet approved or cleared by the [place] FDA and has been authorized for detection and/or diagnosis of SARS-CoV-2 by FDA under an Emergency Use Authorization (EUA). This EUA will remain in effect (meaning this test can be used) for the duration of the COVID-19 declaration under Section 564(b)(1) of the Act,  21 U.S.C. section 360bbb-3(b)(1), unless the authorization is terminated or revoked.  Performed at [facility], [address], [place], [place] [address]   Culture, blood (routine x 2)     Status: None   Collection Time: 08/07/21  5:44 PM   Specimen: BLOOD LEFT HAND  Result Value Ref Range Status   Specimen Description BLOOD LEFT HAND  Final   Special Requests   Final    BOTTLES DRAWN AEROBIC AND ANAEROBIC Blood Culture adequate volume   Culture   Final    NO GROWTH 5 DAYS Performed at [facility], [address], [place], [place] [address]    Report Status 08/12/2021 FINAL  Final  Culture, blood (routine x 2)     Status: None   Collection Time: 08/07/21  5:44 PM   Specimen: BLOOD RIGHT HAND  Result Value Ref Range Status   Specimen Description BLOOD RIGHT HAND  Final   Special Requests   Final    BOTTLES DRAWN AEROBIC AND ANAEROBIC Blood Culture adequate volume   Culture   Final    NO GROWTH 5 DAYS Performed at [facility], [address] [place], [place] [address]    Report Status 08/12/2021 FINAL  Final  Culture, blood (routine x 2)     Status: None   Collection Time: 08/08/21  7:11 PM   Specimen: BLOOD  Result Value Ref Range Status   Specimen Description BLOOD BLOOD RIGHT HAND  Final   Special Requests   Final    BOTTLES DRAWN AEROBIC AND ANAEROBIC Blood Culture adequate volume   Culture   Final    NO GROWTH 5 DAYS Performed at [facility], [address] [place], [place] [address]    Report Status 08/13/2021 FINAL  Final  Culture, blood (routine x 2)     Status: None    Collection Time: 08/08/21  7:11 PM   Specimen: Right Antecubital; Blood  Result Value Ref Range Status   Specimen Description RIGHT ANTECUBITAL  Final   Special Requests   Final    BOTTLES DRAWN AEROBIC AND ANAEROBIC Blood Culture adequate volume   Culture   Final    NO GROWTH 5 DAYS Performed at [facility], [address], [place], [place] [address]    Report Status 08/13/2021 FINAL  Final  Culture, blood (routine x 2)     Status: None (Preliminary result)   Collection Time: 08/15/21  8:41 AM   Specimen: BLOOD  Result Value Ref Range Status   Specimen Description BLOOD RIGHT ANTECUBITAL  Final   Special Requests   Final    BOTTLES DRAWN AEROBIC AND ANAEROBIC Blood Culture adequate volume   Culture   Final    NO GROWTH < 24 HOURS Performed at [facility], [address], [place], [place] [address]    Report Status PENDING  Incomplete  Culture, blood (routine x 2)     Status: None (Preliminary result)   Collection Time: 08/15/21  8:42 AM   Specimen: BLOOD  Result Value Ref Range Status   Specimen Description BLOOD LEFT ANTECUBITAL  Final   Special Requests   Final    BOTTLES DRAWN AEROBIC AND ANAEROBIC Blood Culture adequate volume   Culture   Final    NO GROWTH < 24 HOURS Performed at [facility], [address] [place], [place] [address]    Report Status PENDING  Incomplete     Time coordinating discharge: 40 minutes  SIGNED:   Elmarie Shiley, MD  [facility]

## 2021-08-16 NOTE — Progress Notes (Addendum)
Patient seen and examined by me this morning; discussed with Neil Crouch, PA-C and Dr. Lavonia Dana; electronically with Dr. Tyrell Antonio.  Findings are as follows.   Patient ambulating in room and hallway.  Tolerating full liquid diet. States abdominal pain is minimal (nursing staff reports he did receive 1 oxycodone this morning). He is moving his bowels; he is urinating.   Vital signs in last 24 hours: Temp:  [98.2 F (36.8 C)-98.9 F (37.2 C)] 98.2 F (36.8 C) (08/26 0445) Pulse Rate:  [86-95] 92 (08/26 0804) Resp:  [18-20] 18 (08/26 0445) BP: (154-164)/(78-86) 164/85 (08/26 0804) SpO2:  [95 %-96 %] 95 % (08/26 0445) FiO2 (%):  [21 %] 21 % (08/25 1300) Last BM Date: 08/14/21  He appears comfortable.  Has alert and conversant. Abdomen is slightly distended.  Positive bowel sounds.  Abdomen is soft with very minimal periumbilical tenderness to deep palpation.  No obvious mass organomegaly   08/25 0701 - 08/26 0700 In: 1534.9 [P.O.:640; I.V.:894.9] Out: 2275 [Urine:2275] Intake/Output this shift: Total I/O In: 680 [P.O.:680] Out: -   Lab Results: CBC Recent Labs    08/14/21 0544 08/15/21 0605 08/16/21 0600  WBC 15.1* 17.5* 19.4*  HGB 12.1* 12.4* 11.9*  HCT 36.8* 38.0* 36.3*  MCV 86.8 86.4 86.4  PLT 230 275 310   BMET Recent Labs    08/14/21 0544 08/15/21 0605 08/16/21 0600  NA 132* 134* 135  K 3.6 3.6 3.6  CL 97* 96* 98  CO2 '26 27 28  '$ GLUCOSE 111* 116* 129*  BUN '10 10 10  '$ CREATININE 0.71 0.80 0.76  CALCIUM 8.0* 8.2* 8.3*   LFTs Recent Labs    08/16/21 0600  BILITOT 0.9  ALKPHOS 93  AST 55*  ALT 60*  PROT 6.1*  ALBUMIN 2.7*   No results for input(s): LIPASE in the last 72 hours. PT/INR No results for input(s): LABPROT, INR in the last 72 hours.    Imaging Studies: DG Chest 2 View  Result Date: 08/15/2021 CLINICAL DATA:  Pancreatitis EXAM: CHEST - 2 VIEW COMPARISON:  None. FINDINGS: Midline sternotomy. Small RIGHT effusion and trace LEFT  effusion. No pulmonary edema. No pneumothorax. Pneumonia IMPRESSION: Bilateral small effusions, RIGHT greater than LEFT. Electronically Signed   By: Suzy Bouchard M.D.   On: 08/15/2021 13:14   CT ABDOMEN PELVIS W CONTRAST  Result Date: 08/15/2021 CLINICAL DATA:  Pancreatitis, epigastric pain radiating to back EXAM: CT ABDOMEN AND PELVIS WITH CONTRAST TECHNIQUE: Multidetector CT imaging of the abdomen and pelvis was performed using the standard protocol following bolus administration of intravenous contrast. CONTRAST:  69m OMNIPAQUE IOHEXOL 350 MG/ML SOLN COMPARISON:  08/10/2021 FINDINGS: Lower chest: There are trace bilateral pleural effusions and bilateral lower lobe atelectasis, unchanged since prior exam. Hepatobiliary: Decreased high attenuation material within the gallbladder compatible with decreased gallbladder sludge. No evidence of calcified gallstones or cholecystitis. Liver is unremarkable. Pancreas: There is worsening edema throughout the pancreas, with marked decreased enhancement of the pancreatic parenchyma within the head and body, compatible with acute necrotizing pancreatitis. No fluid collection, pseudocyst, or abscess at this time. Spleen: Normal in size without focal abnormality. Adrenals/Urinary Tract: Stable 1.2 cm indeterminate left adrenal nodule, statistically likely an adenoma. Right adrenals unremarkable. The kidneys enhance normally and symmetrically. No urinary tract calculi or obstructive uropathy. The bladder is unremarkable. Stomach/Bowel: There is segmental dilation of the proximal jejunum with scattered gas fluid levels, likely ileus. No evidence of high-grade obstruction. No bowel wall thickening or inflammatory change. Vascular/Lymphatic: Stable atherosclerosis  of the aorta and its branches. There is mild extrinsic compression upon the confluence of the SMV and splenic vein, though the structures remain patent. The portal vein is unremarkable. No pathologic adenopathy  within the abdomen or pelvis. Reproductive: Prostate is unremarkable. Other: Small volume ascites throughout the abdomen and pelvis, stable since prior study. No free intraperitoneal gas. No abdominal wall hernia. Musculoskeletal: No acute or destructive bony lesions. Reconstructed images demonstrate no additional findings. IMPRESSION: 1. Worsening inflammatory changes of the pancreas, with decreasing parenchymal enhancement within the pancreatic head and body. Findings are compatible with progressive acute necrotizing pancreatitis. No fluid collection, pseudocyst, or abscess at this time. 2. Small volume ascites, stable. 3. Extrinsic compression upon the confluence of the SMV and splenic vein. These vessels as well as the portal vein remain patent. 4. Stable small bilateral pleural effusions and bibasilar atelectasis. 5. Segmental dilation with gas fluid levels in the mid jejunum compatible with ileus. No evidence of high-grade obstruction. 6.  Aortic Atherosclerosis (ICD10-I70.0). Electronically Signed   By: Randa Ngo M.D.   On: 08/15/2021 17:07   CT ABDOMEN PELVIS W CONTRAST  Result Date: 08/10/2021 CLINICAL DATA:  Abdominal pain.  Recent diagnosis of pancreatitis. EXAM: CT ABDOMEN AND PELVIS WITH CONTRAST TECHNIQUE: Multidetector CT imaging of the abdomen and pelvis was performed using the standard protocol following bolus administration of intravenous contrast. CONTRAST:  70m OMNIPAQUE IOHEXOL 350 MG/ML SOLN COMPARISON:  CT of the abdomen pelvis dated 08/07/2021. FINDINGS: Lower chest: Small bilateral pleural effusions with partial compressive atelectasis of the lower lobes, new since the prior CT. Pneumonia is not excluded. Clinical correlation is recommended. There is advanced coronary vascular calcification. No intra-abdominal free air. Small ascites, increased since the prior CT. Hepatobiliary: The liver is unremarkable. High attenuating content within the gallbladder may represent sludge or small  stones or vicarious excretion of contrast. Pancreas: Diffuse inflammatory changes of the pancreas in keeping with known pancreatitis. No drainable fluid collection/abscess or pseudocyst. Areas of hypoenhancement primarily involving the head and uncinate process of the pancreas may be due to edema or early necrosis. Spleen: Normal in size without focal abnormality. Adrenals/Urinary Tract: Indeterminate 15 mm left adrenal nodule. The right adrenal gland is unremarkable. There is no hydronephrosis on either side. There is symmetric enhancement and excretion of contrast by both kidneys. The visualized ureters and urinary bladder appear unremarkable. Stomach/Bowel: There is no bowel obstruction or active inflammation. The appendix is normal. Vascular/Lymphatic: Moderate aortoiliac atherosclerotic disease. The IVC is unremarkable. No portal venous gas. There is no adenopathy. Reproductive: Mildly enlarged prostate gland measuring 5 cm in transverse axial diameter. The seminal vesicles are symmetric. Other: Mild subcutaneous edema. Musculoskeletal: Mild degenerative changes. No acute osseous pathology. IMPRESSION: 1. Acute pancreatitis. No drainable fluid collection/abscess or pseudocyst. 2. Small bilateral pleural effusions with partial compressive atelectasis of the lower lobes, new since the prior CT. Pneumonia is not excluded. Clinical correlation is recommended. 3. Small ascites, increased since the prior CT. 4. No bowel obstruction. Normal appendix. 5. Aortic Atherosclerosis (ICD10-I70.0). Electronically Signed   By: AAnner CreteM.D.   On: 08/10/2021 19:38   CT ABDOMEN PELVIS W CONTRAST  Result Date: 08/07/2021 CLINICAL DATA:  Left lower quadrant abdominal pain EXAM: CT ABDOMEN AND PELVIS WITH CONTRAST TECHNIQUE: Multidetector CT imaging of the abdomen and pelvis was performed using the standard protocol following bolus administration of intravenous contrast. CONTRAST:  871mOMNIPAQUE IOHEXOL 350 MG/ML SOLN  COMPARISON:  None. FINDINGS: Lower chest: Small hiatal hernia.  No  acute abnormality. Hepatobiliary: No focal liver abnormality is seen. No gallstones, gallbladder wall thickening, or biliary dilatation. Pancreas: Inflammatory change and trace fluid seen about the pancreas. Mild gland hypoenhancement of the neck/body. Small volume fluid tracking into the right greater than left paracolic gutters. Spleen: Normal in size without focal abnormality. Adrenals/Urinary Tract: Adrenal glands are unremarkable. Kidneys are normal, without renal calculi, focal lesion, or hydronephrosis. Bladder is unremarkable. Stomach/Bowel: Stomach is within normal limits. Appendix appears normal. No evidence of bowel wall thickening, distention, or inflammatory changes. Vascular/Lymphatic: No significant vascular findings are present. No enlarged abdominal or pelvic lymph nodes. Reproductive: Prostatomegaly, measuring up to 5.2 cm. Other: No abdominal wall hernia or abnormality. No abdominopelvic ascites. Musculoskeletal: No acute or significant osseous findings. IMPRESSION: Findings compatible with acute pancreatitis.Mild gland hypoenhancement of the neck/body of the pancreas, concerning for necrosis. Electronically Signed   By: Yetta Glassman M.D.   On: 08/07/2021 14:32   DG Abdomen Acute W/Chest  Result Date: 08/07/2021 CLINICAL DATA:  Abdominal pain EXAM: DG ABDOMEN ACUTE WITH 1 VIEW CHEST COMPARISON:  None. FINDINGS: Chest: The cardiomediastinal silhouette is within normal limits. There is no focal consolidation or pulmonary edema. There is no pleural effusion or pneumothorax. Median sternotomy wires are noted. Abdomen: There is a paucity of bowel gas with no evidence of mechanical obstruction. There is no evidence of free intraperitoneal air. There is no abnormal soft tissue calcification. Suture anchors are noted in the right humeral head. There is a mildly displaced fracture of the right lateral ninth rib. IMPRESSION: 1.  Paucity of bowel gas with no evidence of mechanical obstruction. 2. Age-indeterminate mildly displaced fracture of the right lateral ninth rib. Correlate with point tenderness. Electronically Signed   By: Valetta Mole M.D.   On: 08/07/2021 12:45   US Abdomen Limited RUQ (LIVER/GB)  Result Date: 08/07/2021 CLINICAL DATA:  History of acute pancreatitis on recent CT EXAM: ULTRASOUND ABDOMEN LIMITED RIGHT UPPER QUADRANT COMPARISON:  CT from earlier in the same day. FINDINGS: Gallbladder: Gallbladder is well distended without evidence of wall thickening or pericholecystic fluid. No cholelithiasis is noted. Ring down artifact is noted in the anterior wall of the gallbladder likely related adenomyomatosis Common bile duct: Diameter: 6.2 mm within normal limits for the patient's given age. Liver: Mild increase in echogenicity is noted. No focal mass is noted. Portal vein is patent on color Doppler imaging with normal direction of blood flow towards the liver. Other: None. IMPRESSION: Fatty liver. Adenomyomatosis of the gallbladder. No acute abnormality is noted. Electronically Signed   By: Inez Catalina M.D.   On: 08/07/2021 18:21  [2 weeks]   Assessment:  Pleasant 70 year old male admitted with acute pancreatitis 9 days ago.  First episode that he is aware of.  Back in May while on vacation he had some milder abdominal pain with vomiting in the setting of eating tacos and a few margaritas.  GI consulted yesterday with question of need to repeat CT or start IV antibiotics due to rising white blood cell count.  Acute necrotizing pancreatitis: Etiology of pancreatitis likely etoh related.  Query biliary component.  Bump in AST/ALT today.  White blood cell count of more today at 19,400.  Remains afebrile.  Clinically improved. Imaging studies have not shown discrete cholelithiasis but there has been concern for possible sludge versus tiny stones. Third CT scan yesterday showed decreased high attenuation material  within the gallbladder compatible with decreased gallbladder sludge.  No evidence of stones.  No evidence of cholelithiasis.  Worsening edema throughout the pancreas with marked decreased enhancement of the pancreatic parenchyma within the head and body compatible with acute necrotizing pancreatitis.  No fluid collection, pseudocyst or abscess noted.  Segmental dilation of the proximal jejunum with scattered gas fluid levels, likely ileus.  Mild extrinsic compression upon the confluence of the SMV and splenic vein, though the structures remain patent.  Small volume ascites.  Plan: Discussed with Dr. Gala Romney, clinically stable for discharge.  Plan for follow with up with Dr. Laural Golden, for pancreatic protocol CT in about 8 weeks.   Laureen Ochs. Bernarda Caffey [facility] [phone] 8/26/202212:09 PM    LOS: 9 days    Attending note: Patient seen and examined.  CT scans reviewed personally with Dr. Thornton Papas.  Yesterday's CT scan, on balance, looks the same, if not slightly improved, from his scan on admission.  There is less free fluid.  There is a small stable, nonviable area of pancreatic tissue in the body (necrotizing component).  No evidence of pseudocyst, abscess or thrombosis.  Clinically, he is doing well.  No tachycardia or fever.  He is not hemoconcentrated, renal function remains normal.  Pain has waned and he is tolerating his diet.  The etiology of his acute pancreatitis is not well defined,however alcohol is the most likely culprit here followed by microlithiasis.  Other less likely possible etiologies include a small tumor or autoimmune etiology   Mild bump in aminotransferases is nonspecific in this setting.  Recommendations: From a GI standpoint could go home later today on a low-fat diet.  Absolutely no alcohol.  He will need, at a minimum, a pancreatic protocol CT in approximately 8 weeks.  2-week interval follow-up with Dr. Laural Golden.  Repeat hepatic profile  at that time.  Continue to follow clinically.  Would discourage serial assessments of white count and CRP which will likely remain elevated for days to come.

## 2021-08-16 NOTE — Telephone Encounter (Signed)
Patient will need hospital follow with Dr. Laural Golden for necrotizing pancreatitis. Will need pancreatic protocol CT in about 8 weeks.

## 2021-08-16 NOTE — Discharge Instructions (Signed)
Please follow low fat Diet.  Do not take more than 1 or 2 tablet of tylenol of '500mg'$ .  If you develop fever or worsening abdominal pain, nausea or vomiting you will need medical evaluation

## 2021-08-17 LAB — URINE CULTURE: Culture: <10000 — AB

## 2021-08-19 ENCOUNTER — Other Ambulatory Visit (INDEPENDENT_AMBULATORY_CARE_PROVIDER_SITE_OTHER): Payer: Self-pay

## 2021-08-19 DIAGNOSIS — K8591 Acute pancreatitis with uninfected necrosis, unspecified: Secondary | ICD-10-CM

## 2021-08-20 LAB — CULTURE, BLOOD (ROUTINE X 2)
Culture: NO GROWTH
Culture: NO GROWTH
Special Requests: ADEQUATE
Special Requests: ADEQUATE

## 2021-08-22 DIAGNOSIS — K859 Acute pancreatitis without necrosis or infection, unspecified: Secondary | ICD-10-CM | POA: Diagnosis not present

## 2021-09-04 DIAGNOSIS — E46 Unspecified protein-calorie malnutrition: Secondary | ICD-10-CM | POA: Diagnosis not present

## 2021-09-04 DIAGNOSIS — K859 Acute pancreatitis without necrosis or infection, unspecified: Secondary | ICD-10-CM | POA: Diagnosis not present

## 2021-09-04 DIAGNOSIS — D72829 Elevated white blood cell count, unspecified: Secondary | ICD-10-CM | POA: Diagnosis not present

## 2021-09-04 DIAGNOSIS — E440 Moderate protein-calorie malnutrition: Secondary | ICD-10-CM | POA: Diagnosis not present

## 2021-09-04 DIAGNOSIS — K8581 Other acute pancreatitis with uninfected necrosis: Secondary | ICD-10-CM | POA: Diagnosis not present

## 2021-09-04 DIAGNOSIS — R7309 Other abnormal glucose: Secondary | ICD-10-CM | POA: Diagnosis not present

## 2021-09-05 ENCOUNTER — Ambulatory Visit (INDEPENDENT_AMBULATORY_CARE_PROVIDER_SITE_OTHER): Payer: PPO | Admitting: Internal Medicine

## 2021-09-05 ENCOUNTER — Encounter (INDEPENDENT_AMBULATORY_CARE_PROVIDER_SITE_OTHER): Payer: Self-pay | Admitting: Internal Medicine

## 2021-09-05 ENCOUNTER — Other Ambulatory Visit: Payer: Self-pay

## 2021-09-05 VITALS — BP 114/69 | HR 71 | Temp 98.6°F | Ht 68.0 in | Wt 168.3 lb

## 2021-09-05 DIAGNOSIS — R748 Abnormal levels of other serum enzymes: Secondary | ICD-10-CM | POA: Diagnosis not present

## 2021-09-05 DIAGNOSIS — K8591 Acute pancreatitis with uninfected necrosis, unspecified: Secondary | ICD-10-CM | POA: Diagnosis not present

## 2021-09-05 DIAGNOSIS — D649 Anemia, unspecified: Secondary | ICD-10-CM

## 2021-09-05 NOTE — Patient Instructions (Signed)
Continue low-fat diet as you are doing. Can take OTC antacids or Tums for heartburn on as-needed basis. Notify if you have fever greater than 100F or worsening pain.  Blood work to be done prior to next visit.

## 2021-09-05 NOTE — Progress Notes (Signed)
Presenting complaint;  Follow-up recent hospitalization for necrotizing pancreatitis.  Database and subjective:  Patient is 70 year old Caucasian male who is here for scheduled visit. He was admitted to [facility] on 08/07/2021 for acute pancreatitis.  He had CT on admission followed by CT on 08/10/2021 and finally on 08/15/2021.  Sequential CTs revealed progression of his pancreatitis.  The CT revealed necrotizing pancreatitis. During admission he also had abdominal ultrasound which was negative for cholelithiasis.  He did have mildly elevated transaminases which normalized during admission. Serum calcium, serum triglyceride and IgG levels were normal. He drinks alcohol no more than 2 4 mixed drinks per weekend. Therefore etiology of his necrotizing pancreatitis was not clear.  Patient states he is feeling better.  He remains with pain in epigastric region which he describes as tooth ache.  He said this has been gradually getting better.  He has not taken pain medication since he took a dose 3 days ago.  He is using Tylenol on an intermittent basis.  He took a dose this morning.  He remains on low-fat diet.  He is watching intake.  He has not experience worsening pain after meals.  He he may experience heartburn here and there and wonders if he can take Tums.  He has not experienced nausea vomiting fever or chills.  Bowels are moving daily.  He denies melena or rectal bleeding.  He has lost close to 15 pounds during this illness. He was on Repatha at the time of admission.  He states he had been on this medication for 1 year and he had been on Lyrica for 3 months which was discontinued. He wants to know if he can go back to playing golf again.  Family history is negative for pancreatitis.  Current Medications: Outpatient Encounter Medications as of 09/05/2021  Medication Sig   acetaminophen (TYLENOL) 325 MG tablet Take 650 mg by mouth daily as needed for moderate pain or headache.    aspirin 81 MG tablet Take 1 tablet (81 mg total) by mouth daily.   baclofen 5 MG TABS Take 5 mg by mouth 2 (two) times daily as needed for muscle spasms.   irbesartan (AVAPRO) 75 MG tablet Take 37.5 mg by mouth daily.   metoprolol tartrate (LOPRESSOR) 25 MG tablet Take 12.5 mg by mouth every morning.   Multiple Vitamins-Minerals (MULTIVITAMIN WITH MINERALS) tablet Take 1 tablet by mouth daily.   pantoprazole (PROTONIX) 40 MG tablet Take 1 tablet (40 mg total) by mouth 2 (two) times daily.   polyethylene glycol (MIRALAX / GLYCOLAX) 17 g packet Take 17 g by mouth daily.   [DISCONTINUED] senna-docusate (SENOKOT-S) 8.6-50 MG tablet Take 2 tablets by mouth 2 (two) times daily.   oxyCODONE (OXY IR/ROXICODONE) 5 MG immediate release tablet Take 1 tablet (5 mg total) by mouth every 6 (six) hours as needed for moderate pain. (Patient not taking: Reported on 09/05/2021)   No facility-administered encounter medications on file as of 09/05/2021.    Objective: Blood pressure 114/69, pulse 71, temperature 98.6 F (37 C), temperature source Oral, height _0  (1.727 m), weight 168 lb 4.8 oz (76.3 kg). Patient is alert and in no acute distress. Conjunctiva is pink. Sclera is nonicteric Oropharyngeal mucosa is normal. No neck masses or thyromegaly noted. Cardiac exam with regular rhythm normal S1 and S2. No murmur or gallop noted. Lungs are clear to auscultation. Abdomen is symmetrical.  Bowel sounds are normal.  On palpation abdomen is soft.  He has fullness and mild  midepigastric tenderness without any guarding.  Liver edge is palpable.  It is soft and nontender. No LE edema or clubbing noted.  Labs/studies Results:   CBC Latest Ref Rng & Units 08/16/2021 08/15/2021 08/14/2021  WBC 4.0 - 10.5 K/uL 19.4(H) 17.5(H) 15.1(H)  Hemoglobin 13.0 - 17.0 g/dL 11.9(L) 12.4(L) 12.1(L)  Hematocrit 39.0 - 52.0 % 36.3(L) 38.0(L) 36.8(L)  Platelets 150 - 400 K/uL 310 275 230    CMP Latest Ref Rng & Units 08/16/2021  08/15/2021 08/14/2021  Glucose 70 - 99 mg/dL 129(H) 116(H) 111(H)  BUN 8 - 23 mg/dL _0 Creatinine 0.61 - 1.24 mg/dL 0.76 0.80 0.71  Sodium 135 - 145 mmol/L 135 134(L) 132(L)  Potassium 3.5 - 5.1 mmol/L 3.6 3.6 3.6  Chloride 98 - 111 mmol/L 98 96(L) 97(L)  CO2 22 - 32 mmol/L _1 Calcium 8.9 - 10.3 mg/dL 8.3(L) 8.2(L) 8.0(L)  Total Protein 6.5 - 8.1 g/dL 6.1(L) - -  Total Bilirubin 0.3 - 1.2 mg/dL 0.9 - -  Alkaline Phos 38 - 126 U/L 93 - -  AST 15 - 41 U/L 55(H) - -  ALT 0 - 44 U/L 60(H) - -    Hepatic Function Latest Ref Rng & Units 08/16/2021 08/13/2021 08/12/2021  Total Protein 6.5 - 8.1 g/dL 6.1(L) - 5.6(L)  Albumin 3.5 - 5.0 g/dL 2.7(L) - 2.8(L)  AST 15 - 41 U/L 55(H) - 36  ALT 0 - 44 U/L 60(H) - 33  Alk Phosphatase 38 - 126 U/L 93 - 60  Total Bilirubin 0.3 - 1.2 mg/dL 0.9 2.1(H) 2.1(H)  Bilirubin, Direct 0.0 - 0.2 mg/dL - 0.8(H) -    Lab data from 09/04/2021(Dr. Fagan's office)  WBC 5.9, neutrophils 70% H&H 11.7 and 36.5 Platelet count 319K  Glucose 173 , BUN 18 and creatinine 1.16. Serum sodium 137, serum potassium 4.2, chloride 97, CO2 22 Serum calcium 9.8 Bilirubin 0.3, AP 177, AST 29, ALT 39, total protein 7.5 and albumin 4.5.   3 CTs and abdominal ultrasound were reviewed.  Assessment:  #1.  Necrotizing pancreatitis.  He was hospitalized for 9 days.  Etiology could not be established.  He does not have hypercalcemia or hypertriglyceridemia.  Family history is negative for pancreatitis and serum IgG 4 were normal.  Etiology does not appear to be due to alcohol.  His transaminases were mildly elevated on admission raising suspicion of biliary pancreatitis.  However ultrasound negative for cholelithiasis.  Doubt congenital anomaly given his age.  Occult neoplasm remains in differential diagnosis.  Doubt doubt that the cause of his pancreatitis is Lyrica which has been discontinued. He has done remarkably well given clinical course.  However he is at risk to  develop pseudocyst. Lab data from Dr. Ria Comment office reveal elevated blood glucose.  We will need to monitor him for diabetes mellitus. Will plan follow-up abdominal pelvic CT in 5 weeks prior to considering endoscopic ultrasound.  #2.  Mildly elevated alkaline phosphatase.  His alkaline phosphatase was normal when he was hospitalized.  His transaminases were mildly elevated on admission and subsequently had remained normal.  He is certainly at risk for biliary obstruction due to complication of pancreatitis.  As he is having no symptoms will monitor for now.  #3.  Anemia.  Anemia appears to be due to acute illness.  Insignificant change in H&H since hospitalization.  No evidence of GI bleed.   Plan:  Patient will continue low fat diet and do not drink any alcohol. He  can start playing golf but I would suggest he should not be making wild or powerful swings. Patient advised to call office if he has fever or abdominal pain nausea vomiting or pain not controlled with medication. He will have CBC, comprehensive chemistry panel serum lipase and amylase prior to next office visit in 4 weeks. Will plan abdominal pelvic CT following that visit.

## 2021-09-18 ENCOUNTER — Encounter (INDEPENDENT_AMBULATORY_CARE_PROVIDER_SITE_OTHER): Payer: Self-pay

## 2021-09-20 ENCOUNTER — Telehealth (INDEPENDENT_AMBULATORY_CARE_PROVIDER_SITE_OTHER): Payer: Self-pay | Admitting: *Deleted

## 2021-09-20 NOTE — Telephone Encounter (Signed)
Per Dr. Laural Golden he will call the patient and speak with him regarding his pain and symptoms.

## 2021-09-20 NOTE — Telephone Encounter (Signed)
Patient was seen 9/15 for pancreatitis, Dr Laural Golden advised patient to call office if pain increased, patient states pain has increased over last 24 hours, he wants to know what Dr Laural Golden wants him to do. He is sch'd for CT 10/14/21 - please call [phone]

## 2021-09-20 NOTE — Telephone Encounter (Signed)
I contacted patient. He ate [place] sandwich yesterday and also had a biscuit from fast food restaurant yesterday. He has not had fever chills nausea or vomiting. I recommended he should go on full liquid diet and can take oxycodone.  If pain is not relieved or gets worse he will call. He was also advised to call if he has vomiting or temperature greater than 100 F

## 2021-09-20 NOTE — Telephone Encounter (Signed)
I text Dr.[name] to call the office for further instructions. 09/20/2021 9:17 am  I spoke with the patient whom states he was told if he developed a fever or if pain increased to call the office. Patient reports after eating lunch and supper yesterday his pain on a scale of 1-10 was a five. He has no fever, but pain is located mid epigastric area under ribs. He is taking baclofen 5 mg bid and Pantoprazole 40 mg bid and states he has some oxycodone 5 mg he had left over from the last episode. Patient did not know if he needed to go back to the hospital or take the oxycodone for comfort. Patient states he has no nausea,vomiting,fever,diarrhea, no dark or bloody stools seen. He was able to have a bm this am which he described as normal and normal color.

## 2021-09-21 ENCOUNTER — Telehealth (INDEPENDENT_AMBULATORY_CARE_PROVIDER_SITE_OTHER): Payer: Self-pay | Admitting: Internal Medicine

## 2021-09-21 NOTE — Telephone Encounter (Signed)
I called to check on patient's condition.   He took oxycodone 3 times yesterday.  Last dose was around 11 PM.  This morning he feels much better.  He gives his pain score of 1 or 2.  He has not had fever nausea or vomiting.   Patient will continue current diet for the 24 hours and then go back to low-fat diet.   If pain recurs he will let us know in which case we will proceed with abdominal pelvic CT now instead of 10/14/2021.

## 2021-10-02 ENCOUNTER — Other Ambulatory Visit (INDEPENDENT_AMBULATORY_CARE_PROVIDER_SITE_OTHER): Payer: Self-pay | Admitting: *Deleted

## 2021-10-02 DIAGNOSIS — D649 Anemia, unspecified: Secondary | ICD-10-CM

## 2021-10-02 DIAGNOSIS — R748 Abnormal levels of other serum enzymes: Secondary | ICD-10-CM

## 2021-10-02 DIAGNOSIS — K8591 Acute pancreatitis with uninfected necrosis, unspecified: Secondary | ICD-10-CM

## 2021-10-03 ENCOUNTER — Other Ambulatory Visit (INDEPENDENT_AMBULATORY_CARE_PROVIDER_SITE_OTHER): Payer: Self-pay | Admitting: Internal Medicine

## 2021-10-03 MED ORDER — OXYCODONE HCL 5 MG PO TABS: 5.0000 mg | ORAL_TABLET | Freq: Four times a day (QID) | ORAL | 0 refills | Status: DC | PRN

## 2021-10-03 NOTE — Telephone Encounter (Signed)
Prescription for oxycodone 5 mg every 6 hours as needed sent to patient's pharmacy for 30 doses without refill.

## 2021-10-04 ENCOUNTER — Telehealth (INDEPENDENT_AMBULATORY_CARE_PROVIDER_SITE_OTHER): Payer: Self-pay | Admitting: *Deleted

## 2021-10-04 NOTE — Telephone Encounter (Signed)
Pt called on 10/13 and states he is being treated for pancreatitis. States he was told to call back if pain, fever or nausea. He is not having fever or nausea but having some abdominal pain like before. It went away and came back 4 -5 days ago. Today pain level is a 3. Took 2 tylenol and it has not touched the pain. Has oxycodone on hand to take. Does not take daily. Got 30 while in hospital and has 8 pills left. Would like a refill. He is going to [place] tomorrow and wanted to make sure he could travel.   Per dr Laural Golden pt can go to [place], follow a bland diet, can have refill on oxycodone and it was sent in. Ct scan scheduled for the end of the month, if pain worse call and get ct moved up to next week.   Called and discussed all with pt and her verbalized understanding.

## 2021-10-09 DIAGNOSIS — K8591 Acute pancreatitis with uninfected necrosis, unspecified: Secondary | ICD-10-CM | POA: Diagnosis not present

## 2021-10-09 DIAGNOSIS — D649 Anemia, unspecified: Secondary | ICD-10-CM | POA: Diagnosis not present

## 2021-10-09 DIAGNOSIS — R748 Abnormal levels of other serum enzymes: Secondary | ICD-10-CM | POA: Diagnosis not present

## 2021-10-10 LAB — COMPREHENSIVE METABOLIC PANEL
ALT: 70 IU/L — ABNORMAL HIGH (ref 0–44)
AST: 37 IU/L (ref 0–40)
Albumin/Globulin Ratio: 1.4 (ref 1.2–2.2)
Albumin: 4.4 g/dL (ref 3.7–4.7)
Alkaline Phosphatase: 287 IU/L — ABNORMAL HIGH (ref 44–121)
BUN/Creatinine Ratio: 15 (ref 10–24)
BUN: 15 mg/dL (ref 8–27)
Bilirubin Total: 0.9 mg/dL (ref 0.0–1.2)
CO2: 23 mmol/L (ref 20–29)
Calcium: 10.1 mg/dL (ref 8.6–10.2)
Chloride: 101 mmol/L (ref 96–106)
Creatinine, Ser: 0.98 mg/dL (ref 0.76–1.27)
Globulin, Total: 3.2 g/dL (ref 1.5–4.5)
Glucose: 110 mg/dL — ABNORMAL HIGH (ref 70–99)
Potassium: 4.9 mmol/L (ref 3.5–5.2)
Sodium: 140 mmol/L (ref 134–144)
Total Protein: 7.6 g/dL (ref 6.0–8.5)
eGFR: 82 mL/min/{1.73_m2} (ref >59)

## 2021-10-10 LAB — CBC WITH DIFFERENTIAL/PLATELET
Basophils Absolute: 0.1 10*3/uL (ref 0.0–0.2)
Basos: 1 %
EOS (ABSOLUTE): 0.4 10*3/uL (ref 0.0–0.4)
Eos: 6 %
Hematocrit: 38.2 % (ref 37.5–51.0)
Hemoglobin: 12.2 g/dL — ABNORMAL LOW (ref 13.0–17.7)
Immature Grans (Abs): 0 10*3/uL (ref 0.0–0.1)
Immature Granulocytes: 0 %
Lymphocytes Absolute: 1.2 10*3/uL (ref 0.7–3.1)
Lymphs: 17 %
MCH: 26.5 pg — ABNORMAL LOW (ref 26.6–33.0)
MCHC: 31.9 g/dL (ref 31.5–35.7)
MCV: 83 fL (ref 79–97)
Monocytes Absolute: 0.6 10*3/uL (ref 0.1–0.9)
Monocytes: 9 %
Neutrophils Absolute: 4.7 10*3/uL (ref 1.4–7.0)
Neutrophils: 67 %
Platelets: 373 10*3/uL (ref 150–450)
RBC: 4.6 x10E6/uL (ref 4.14–5.80)
RDW: 13.2 % (ref 11.6–15.4)
WBC: 7.1 10*3/uL (ref 3.4–10.8)

## 2021-10-10 LAB — AMYLASE: Amylase: 148 U/L — ABNORMAL HIGH (ref 31–110)

## 2021-10-10 LAB — LIPASE: Lipase: 70 U/L (ref 13–78)

## 2021-10-14 ENCOUNTER — Other Ambulatory Visit: Payer: Self-pay

## 2021-10-14 ENCOUNTER — Ambulatory Visit (HOSPITAL_COMMUNITY)
Admission: RE | Admit: 2021-10-14 | Discharge: 2021-10-14 | Disposition: A | Discharge status: Home / Self Care | Payer: PPO | Source: Ambulatory Visit | Attending: Internal Medicine | Admitting: Internal Medicine

## 2021-10-14 DIAGNOSIS — Z23 Encounter for immunization: Secondary | ICD-10-CM | POA: Diagnosis not present

## 2021-10-14 DIAGNOSIS — K8689 Other specified diseases of pancreas: Secondary | ICD-10-CM | POA: Diagnosis not present

## 2021-10-14 DIAGNOSIS — K8591 Acute pancreatitis with uninfected necrosis, unspecified: Secondary | ICD-10-CM | POA: Insufficient documentation

## 2021-10-14 DIAGNOSIS — K863 Pseudocyst of pancreas: Secondary | ICD-10-CM | POA: Diagnosis not present

## 2021-10-14 DIAGNOSIS — I700 Atherosclerosis of aorta: Secondary | ICD-10-CM | POA: Diagnosis not present

## 2021-10-14 DIAGNOSIS — K859 Acute pancreatitis without necrosis or infection, unspecified: Secondary | ICD-10-CM | POA: Diagnosis not present

## 2021-10-14 MED ORDER — IOHEXOL 300 MG/ML  SOLN
100.0000 mL | Freq: Once | INTRAMUSCULAR | Status: AC | PRN
  Administered 2021-10-14: 100 mL via INTRAVENOUS

## 2021-10-17 ENCOUNTER — Ambulatory Visit (INDEPENDENT_AMBULATORY_CARE_PROVIDER_SITE_OTHER): Payer: PPO | Admitting: Internal Medicine

## 2021-10-17 ENCOUNTER — Encounter (INDEPENDENT_AMBULATORY_CARE_PROVIDER_SITE_OTHER): Payer: Self-pay | Admitting: Internal Medicine

## 2021-10-17 ENCOUNTER — Other Ambulatory Visit: Payer: Self-pay

## 2021-10-17 DIAGNOSIS — K859 Acute pancreatitis without necrosis or infection, unspecified: Secondary | ICD-10-CM | POA: Diagnosis not present

## 2021-10-17 DIAGNOSIS — K863 Pseudocyst of pancreas: Secondary | ICD-10-CM

## 2021-10-17 NOTE — Progress Notes (Signed)
Presenting complaint;  Follow-up for pancreatitis  Database and subjective:  Patient is 70-year-old Caucasian male presenting for scheduled visit.  He was last seen in the office on 09/05/2021 following 9-day hospitalization in August 2022 for necrotizing pancreatitis.  Etiology was was not well-established.  It was felt procedure microlithiasis for secondary to l Lyrica.  Serum IgG4 was normal.  Alcohol was felt not to be the cause as he reported drinking no more than 2 to 4 for mixed drinks per week. On his last visit felt he was doing well on low-fat diet.  Alkaline phosphatase was mildly elevated with normal bilirubin and AST and borderline ALT. Patient called office on 09/20/2021 with upper abdominal pain which developed after he ate turkey sandwich at biscuit from a local restaurant.  He was not throwing up and he was afebrile.  He was advised to stay on liquids for 24 hours.  He took few doses of oxycodone and felt better. I talked with him 2 days ago to discuss results of CT and he was doing fine. He had blood work and CT prior to this visit as planned.  Today he complains of mild upper abdominal pain.  He says pain started after he ate patient at a local restaurant.  He is better today.  He has not had any nausea vomiting fever chills or night sweats.  He has lost 6 pounds since his last visit.  He generally has 1 formed stool daily.  He denies melena or rectal bleeding.  He denies heartburn.  He states he is able to sleep well except he has to get up once to urinate.  He feels his quality of life is about 85% of what he used to be. He has not had any alcohol since last admission.   Current Medications: Outpatient Encounter Medications as of 10/17/2021  Medication Sig   acetaminophen (TYLENOL) 325 MG tablet Take 650 mg by mouth daily as needed for moderate pain or headache.   aspirin 81 MG tablet Take 1 tablet (81 mg total) by mouth daily.   baclofen 5 MG TABS Take 5 mg by mouth 2 (two)  times daily as needed for muscle spasms.   irbesartan (AVAPRO) 75 MG tablet Take 37.5 mg by mouth daily.   metoprolol tartrate (LOPRESSOR) 25 MG tablet Take 12.5 mg by mouth every morning.   Multiple Vitamins-Minerals (MULTIVITAMIN WITH MINERALS) tablet Take 1 tablet by mouth daily.   oxyCODONE (OXY IR/ROXICODONE) 5 MG immediate release tablet Take 1 tablet (5 mg total) by mouth every 6 (six) hours as needed for moderate pain.   pantoprazole (PROTONIX) 40 MG tablet Take 1 tablet (40 mg total) by mouth 2 (two) times daily.   polyethylene glycol (MIRALAX / GLYCOLAX) 17 g packet Take 17 g by mouth daily.   No facility-administered encounter medications on file as of 10/17/2021.    Objective: Blood pressure 104/65, pulse 66, temperature 98.3 F (36.8 C), temperature source Oral, height 5' 8" (1.727 m), weight 162 lb 9.6 oz (73.8 kg). Patient is alert and in no acute distress. Conjunctiva is pink. Sclera is nonicteric Oropharyngeal mucosa is normal. No neck masses or thyromegaly noted. Cardiac exam with regular rhythm normal S1 and S2. No murmur or gallop noted. Lungs are clear to auscultation. Abdomen is symmetrical.  Bowel sounds are normal.  On palpation abdomen is soft.  He has mild tenderness in left mid abdomen below the right and left costal margin.  He has moderate tenderness in midepigastric region with   some guarding.  This area is firm. No LE edema or clubbing noted.  Labs/studies Results:   CBC Latest Ref Rng & Units 10/09/2021 08/16/2021 08/15/2021  WBC 3.4 - 10.8 x10E3/uL 7.1 19.4(H) 17.5(H)  Hemoglobin 13.0 - 17.7 g/dL 12.2(L) 11.9(L) 12.4(L)  Hematocrit 37.5 - 51.0 % 38.2 36.3(L) 38.0(L)  Platelets 150 - 450 x10E3/uL 373 310 275    CMP Latest Ref Rng & Units 10/09/2021 08/16/2021 08/15/2021  Glucose 70 - 99 mg/dL 110(H) 129(H) 116(H)  BUN 8 - 27 mg/dL 15 10 10  Creatinine 0.76 - 1.27 mg/dL 0.98 0.76 0.80  Sodium 134 - 144 mmol/L 140 135 134(L)  Potassium 3.5 - 5.2 mmol/L  4.9 3.6 3.6  Chloride 96 - 106 mmol/L 101 98 96(L)  CO2 20 - 29 mmol/L 23 28 27  Calcium 8.6 - 10.2 mg/dL 10.1 8.3(L) 8.2(L)  Total Protein 6.0 - 8.5 g/dL 7.6 6.1(L) -  Total Bilirubin 0.0 - 1.2 mg/dL 0.9 0.9 -  Alkaline Phos 44 - 121 IU/L 287(H) 93 -  AST 0 - 40 IU/L 37 55(H) -  ALT 0 - 44 IU/L 70(H) 60(H) -    Hepatic Function Latest Ref Rng & Units 10/09/2021 08/16/2021 08/13/2021  Total Protein 6.0 - 8.5 g/dL 7.6 6.1(L) -  Albumin 3.7 - 4.7 g/dL 4.4 2.7(L) -  AST 0 - 40 IU/L 37 55(H) -  ALT 0 - 44 IU/L 70(H) 60(H) -  Alk Phosphatase 44 - 121 IU/L 287(H) 93 -  Total Bilirubin 0.0 - 1.2 mg/dL 0.9 0.9 2.1(H)  Bilirubin, Direct 0.0 - 0.2 mg/dL - - 0.8(H)    EXAM 10/14/2021 CT ABDOMEN WITHOUT AND WITH CONTRAST   TECHNIQUE: Multidetector CT imaging of the abdomen was performed following the standard protocol before and following the bolus administration of intravenous contrast.   CONTRAST:  100mL OMNIPAQUE IOHEXOL 300 MG/ML  SOLN   COMPARISON:  08/15/2021   FINDINGS: Lower chest: No acute abnormality.   Hepatobiliary: There is no focal liver abnormality. Normal gallbladder. Mild intrahepatic bile duct dilatation. The proximal common bile duct measures up to 8 mm, image 40/13.   Pancreas: Improved appearance of edema throughout the pancreas with decreased peripancreatic inflammatory fat stranding. Large maturing pseudocyst is identified along the head, neck, body and proximal tail of pancreas. On today's exam this measures 10.7 x 4.6 by 7.7 cm. This appears more organized with a thin enhancing rim with central decreased attenuation compared with the previous exam. On 08/15/2020 this measured 11.3 x 6.7 by 8.6 cm. On today's study the pseudocyst extends up to and contacts with the posterior wall of stomach, image 38/4.   Spleen: Normal in size without focal abnormality.   Adrenals/Urinary Tract: Stable nodule in the left adrenal gland measuring 1.2 cm, image 34/9. Normal  right adrenal gland. No signs of kidney mass or hydronephrosis.   Stomach/Bowel: Stomach is normal. There is mild increase caliber of the duodenum and proximal jejunum which measure up to 2.9 cm. Likely reflecting ileus. The remaining visualized bowel loops are unremarkable. Moderate stool burden noted within the colon.   Vascular/Lymphatic: Aortic atherosclerosis. No adenopathy identified. The pancreatic pseudocyst encases and narrows the extrahepatic portal vein which appears focally narrowed proximal to the portal venous confluence. Splenic vein remains patent as does the SMV. No adenopathy identified.   Other: No significant free fluid identified. Fat stranding within the retroperitoneum with epicenter at the pancreas is again noted   Musculoskeletal: No acute or significant osseous findings.   IMPRESSION:   1. Improved appearance of edema throughout the pancreas with decreased pancreatic edema and peripancreatic inflammatory fat stranding. 2. Maturing pseudocyst is identified overlying the head, neck, body and proximal tail of pancreas. This appears more organized with a thin enhancing rim with central decreased attenuation compared with the previous exam. This extends up to and contacts the posterior wall of stomach. 3. The pancreatic pseudocyst encases and narrows the extrahepatic portal vein which appears focally narrowed proximal to the portal venous confluence. No signs of portal vein thrombosis however. 4. Mild increase caliber of the duodenum and proximal jejunum which measure up to 2.9 cm. Likely reflecting focal ileus secondary to pancreatic inflammation. 5. Aortic Atherosclerosis (ICD10-I70.0).     Electronically Signed   By: Taylor  Stroud M.D.   On: 10/15/2021 09:35  Please note I reviewed CT images with the patient.  We looked at first third and current CT.  Assessment:  #1.  Patient has developed large pseudocyst resulting from necrotizing pancreatitis  which she suffered about 10 weeks ago.  His clinical course is not unexpected.  He surprisingly has done well since discharge except he had few days of pain after eating at a restaurant and now he is having some discomfort today eating out again.  He does not appear acutely ill.  It appears his cyst is maturing and he would benefit from endoscopic cystogastrostomy. Dr. Mansouraty has been contacted and he has reviewed patient's CT. Patient's nutritional status is improved as evidenced by normalization of albumin and improvement in hemoglobin. Hopefully he will not have long-term consequences i.e. diabetes mellitus or pancreatic insufficiency.  #2.  Elevated serum alkaline phosphatase.  There is no biliary dilation.  He is certainly at risk to develop such given signs of pancreatic pseudocyst.  Hopefully this would reverse once pseudocyst cyst drainage.  #3.  Anemia.  Anemia secondary to acute illness.  Hemoglobin is slowly coming up.  Plan:  Consultation with Dr. Gabriel Mansouraty for endoscopic cystogastrostomy. In the meantime patient will stay on low-fat diet. If pain worsens and he has fever or vomiting he will call office. Will leave him on PPI and twice daily schedule for now. Will plan to see him in the office when evaluation by Dr. Mansouraty complete.      

## 2021-10-17 NOTE — Patient Instructions (Signed)
Continue low-fat diet as before. Notify if you have abdominal pain unresponsive to oxycodone or if you have fever greater than 100 F Consultation with Dr. Justice Britain.  His office will contact you directly.

## 2021-10-18 ENCOUNTER — Telehealth: Payer: Self-pay

## 2021-10-18 NOTE — Telephone Encounter (Signed)
I have notified the pt of the appt.  Have a great weekend.

## 2021-10-18 NOTE — Telephone Encounter (Signed)
-----   Message from Timothy Lasso, RN sent at 10/15/2021  2:49 PM EDT ----- Regarding: FW: Referral  ----- Message ----- From: Irving Copas., MD Sent: 10/15/2021   2:44 PM EDT To: Rogene Houston, MD, Timothy Lasso, RN Subject: Referral                                       Seraiah Nowack, This is a patient that Dr. Laural Golden and I have discussed. History of pancreatitis with pancreatic pseudocyst and potential need for EUS cyst gastrostomy evaluation. Patient will be evaluated by Dr. Laural Golden later this week. He will will then place a referral for [place] to meet the patient to discuss things further so please be on the look out for this.  NR, If you could please reply back after you have spoken with the patient and if he agrees to meet me to discuss potential cyst gastrostomy then Chong Sicilian can work on scheduling. Thanks. GM

## 2021-10-18 NOTE — Telephone Encounter (Signed)
Hi [name] we have the pt scheduled to see Dr Rush Landmark on 10/31/21 at 2:30 pm.  Will you call the pt or should I?

## 2021-10-18 NOTE — Telephone Encounter (Signed)
Kristeen Miss, Marthenia Rolling, RN Delorise Jackson, Dr Laural Golden wanted me to reach out to you. He and Dr Rush Landmark have talked about Mr Crispo. Dr Laural Golden wants him to have consultation with Dr. Justice Britain for endoscopic cystogastrostomy. If you need anything additional please let me know. Thanks,

## 2021-10-31 ENCOUNTER — Other Ambulatory Visit: Payer: Self-pay

## 2021-10-31 ENCOUNTER — Ambulatory Visit: Payer: PPO | Admitting: Gastroenterology

## 2021-10-31 ENCOUNTER — Encounter: Payer: Self-pay | Admitting: Cardiology

## 2021-10-31 ENCOUNTER — Encounter: Payer: Self-pay | Admitting: Gastroenterology

## 2021-10-31 ENCOUNTER — Ambulatory Visit (INDEPENDENT_AMBULATORY_CARE_PROVIDER_SITE_OTHER): Payer: PPO | Admitting: Cardiology

## 2021-10-31 VITALS — BP 90/60 | HR 76 | Ht 68.0 in | Wt 161.0 lb

## 2021-10-31 VITALS — BP 120/68 | HR 74 | Ht 68.0 in | Wt 161.8 lb

## 2021-10-31 DIAGNOSIS — I25119 Atherosclerotic heart disease of native coronary artery with unspecified angina pectoris: Secondary | ICD-10-CM

## 2021-10-31 DIAGNOSIS — K863 Pseudocyst of pancreas: Secondary | ICD-10-CM | POA: Diagnosis not present

## 2021-10-31 DIAGNOSIS — R935 Abnormal findings on diagnostic imaging of other abdominal regions, including retroperitoneum: Secondary | ICD-10-CM | POA: Diagnosis not present

## 2021-10-31 DIAGNOSIS — D135 Benign neoplasm of extrahepatic bile ducts: Secondary | ICD-10-CM | POA: Diagnosis not present

## 2021-10-31 DIAGNOSIS — K8591 Acute pancreatitis with uninfected necrosis, unspecified: Secondary | ICD-10-CM | POA: Diagnosis not present

## 2021-10-31 DIAGNOSIS — R748 Abnormal levels of other serum enzymes: Secondary | ICD-10-CM | POA: Diagnosis not present

## 2021-10-31 DIAGNOSIS — R634 Abnormal weight loss: Secondary | ICD-10-CM

## 2021-10-31 DIAGNOSIS — E782 Mixed hyperlipidemia: Secondary | ICD-10-CM

## 2021-10-31 DIAGNOSIS — R6881 Early satiety: Secondary | ICD-10-CM

## 2021-10-31 DIAGNOSIS — R630 Anorexia: Secondary | ICD-10-CM | POA: Diagnosis not present

## 2021-10-31 NOTE — Patient Instructions (Addendum)
Medication Instructions:  Your physician recommends that you continue on your current medications as directed. Please refer to the Current Medication list given to you today.  Labwork: none  Testing/Procedures: none  Follow-Up: Your physician recommends that you schedule a follow-up appointment in: 1 year at the [place] office. You will receive a reminder call or letter in the mail in about 10 months reminding you to call and schedule your appointment. If you don't receive this letter, please contact our office.  Any Other Special Instructions Will Be Listed Below (If Applicable).  If you need a refill on your cardiac medications before your next appointment, please call your pharmacy.

## 2021-10-31 NOTE — Progress Notes (Signed)
Cardiology Office Note  Date: 10/31/2021   ID: [name], DOB 02-18-1951, MRN [id number]  PCP:  Asencion Noble, MD  Cardiologist:  Rozann Lesches, MD Electrophysiologist:  None   Chief Complaint  Patient presents with   Cardiac follow-up     History of Present Illness: [name] is a 70 y.o. male former patient of Dr. Bronson Ing now presenting to establish follow-up with me.  I reviewed his records and updated the chart.  He was last seen in October 2021 by Ms. Strader PA-C.  He is here for a routine visit.  Main health change in the last 6 months was an episode of necrotizing pancreatitis back in August, etiology not entirely clear.  He had been on Lyrica and Repatha, both discontinued empirically.  He follows with Dr. Laural Golden and is also scheduled for endoscopic treatment of a large pancreatic pseudocyst by Dr. Rush Landmark in December.  He states his diet has changed significantly, he has lost about 26 pounds in the last 6 months.  He does not report any angina symptoms and otherwise remains on stable cardiac regimen including aspirin, Lopressor, and Avapro.  I reviewed his ECG from August, also interval lab work.  He continues to follow with Dr. Willey Blade.  Past Medical History:  Diagnosis Date   Colon polyps    benign per pt   Coronary artery disease    a. s/p CABG in 2014 with LIMA-LAD and SVG-D1 - performed in [place]   Fracture, ribs    Hyperlipidemia    Hypertension    Pancreatitis    Peripheral neuropathy     Past Surgical History:  Procedure Laterality Date   CATARACT EXTRACTION W/PHACO Right 02/15/2021   Procedure: CATARACT EXTRACTION PHACO AND INTRAOCULAR LENS PLACEMENT ([place]);  Surgeon: Baruch Goldmann, MD;  Location: [facility];  Service: Ophthalmology;  Laterality: Right;  CDE 8.65   CATARACT EXTRACTION W/PHACO Left 03/04/2021   Procedure: CATARACT EXTRACTION PHACO AND INTRAOCULAR LENS PLACEMENT ([facility]);  Surgeon: Baruch Goldmann, MD;  Location: [facility];  Service:  Ophthalmology;  Laterality: Left;  CDE: 8.15   COLONOSCOPY N/A 03/11/2017   [name]: 2 sessile serrated polyps removed, next colonoscopy in 5 years   CORONARY ARTERY BYPASS GRAFT  2014   HERNIA REPAIR Left 2010   inguinal   POLYPECTOMY  03/11/2017   Procedure: POLYPECTOMY;  Surgeon: Rogene Houston, MD;  Location: [facility];  Service: Endoscopy;;  colon   ROTATOR CUFF REPAIR Right 2014   vericous veins  2001    Current Outpatient Medications  Medication Sig Dispense Refill   acetaminophen (TYLENOL) 325 MG tablet Take 650 mg by mouth daily as needed for moderate pain or headache.     aspirin 81 MG tablet Take 1 tablet (81 mg total) by mouth daily. 30 tablet    baclofen 5 MG TABS Take 5 mg by mouth 2 (two) times daily as needed for muscle spasms. 30 each 0   HM CLEARLAX 17 GM/SCOOP powder SMARTSIG:1 Capful(s) By Mouth Daily     irbesartan (AVAPRO) 75 MG tablet Take 37.5 mg by mouth daily.     metoprolol tartrate (LOPRESSOR) 25 MG tablet Take 12.5 mg by mouth every morning.     Multiple Vitamins-Minerals (MULTIVITAMIN WITH MINERALS) tablet Take 1 tablet by mouth daily.     oxyCODONE (OXY IR/ROXICODONE) 5 MG immediate release tablet Take 1 tablet (5 mg total) by mouth every 6 (six) hours as needed for moderate pain. 30 tablet 0   pantoprazole (PROTONIX)  40 MG tablet Take 1 tablet (40 mg total) by mouth 2 (two) times daily. 60 tablet 0   polyethylene glycol (MIRALAX / GLYCOLAX) 17 g packet Take 17 g by mouth daily. 14 each 0   No current facility-administered medications for this visit.   Allergies:  Gabapentin   ROS: No palpitations or syncope.  Physical Exam: VS:  BP 120/68 (BP Location: Left Arm, Patient Position: Sitting, Cuff Size: Normal)   Pulse 74   Ht 5\' 8"  (1.727 m)   Wt 161 lb 12.8 oz (73.4 kg)   SpO2 99%   BMI 24.60 kg/m , BMI Body mass index is 24.6 kg/m.  Wt Readings from Last 3 Encounters:  10/31/21 161 lb 12.8 oz (73.4 kg)  10/31/21 161 lb (73 kg)  10/17/21  162 lb 9.6 oz (73.8 kg)    General: Patient appears comfortable at rest. HEENT: Conjunctiva and lids normal, wearing a mask. Neck: Supple, no elevated JVP or carotid bruits, no thyromegaly. Lungs: Clear to auscultation, nonlabored breathing at rest. Cardiac: Regular rate and rhythm, no S3 or significant systolic murmur, no pericardial rub. Extremities: No pitting edema.  ECG:  An ECG dated 08/07/2021 was personally reviewed today and demonstrated:  Sinus rhythm with left atrial enlargement and old anterior infarct pattern.  Recent Labwork: 11/06/2020: TSH 2.640 08/08/2021: Magnesium 1.7 10/09/2021: ALT 70; AST 37; BUN 15; Creatinine, Ser 0.98; Hemoglobin 12.2; Platelets 373; Potassium 4.9; Sodium 140     Component Value Date/Time   CHOL 106 08/07/2021 1744   CHOL 101 11/28/2019 0834   TRIG 110 08/07/2021 1744   HDL 37 (L) 08/07/2021 1744   HDL 33 (L) 11/28/2019 0834   CHOLHDL 2.9 08/07/2021 1744   VLDL 22 08/07/2021 1744   LDLCALC 47 08/07/2021 1744   LDLCALC 44 11/28/2019 0834    Other Studies Reviewed Today:  Echocardiogram 11/23/2019:  1. Left ventricular ejection fraction, by visual estimation, is 60 to  65%. The left ventricle has normal function. There is no left ventricular  hypertrophy.   2. Left ventricular diastolic parameters are consistent with Grade I  diastolic dysfunction (impaired relaxation).   3. The left ventricle has no regional wall motion abnormalities.   4. Global right ventricle has low normal systolic function.The right  ventricular size is normal. No increase in right ventricular wall  thickness.   5. Left atrial size was normal.   6. Right atrial size was mildly dilated.   7. Mild mitral annular calcification.   8. The mitral valve is degenerative. Mild mitral valve regurgitation.   9. The tricuspid valve is grossly normal. Tricuspid valve regurgitation  is mild.  10. The aortic valve is tricuspid. Aortic valve regurgitation is not  visualized.  No evidence of aortic valve sclerosis or stenosis.  11. The pulmonic valve was grossly normal. Pulmonic valve regurgitation is  mild.  12. Aortic dilatation noted.  13. There is mild dilatation of the aortic root.  14. Normal pulmonary artery systolic pressure.  15. The inferior vena cava is normal in size with greater than 50%  respiratory variability, suggesting right atrial pressure of 3 mmHg.   Assessment and Plan:  1.  Multivessel CAD status post CABG in 2014.  He remains clinically stable without active angina and continues on aspirin, Lopressor, and Avapro.  I reviewed his interval ECG from August.  Surveillance ischemic testing can be considered with time, no clear indication to proceed at this point however.  2.  History of necrotizing pancreatitis in August,  etiology uncertain.  He continues to follow with gastroenterology and awaits endoscopic treatment of a large pancreatic pseudocyst in December.  Of note, he is no longer on Repatha.  3.  Mixed hyperlipidemia with history of statin and Zetia intolerance.  He had previously been doing well on Repatha with last LDL 47.  This can be reevaluated going forward.  He has modified his diet and lost a significant amount of weight since August.  Medication Adjustments/Labs and Tests Ordered: Current medicines are reviewed at length with the patient today.  Concerns regarding medicines are outlined above.   Tests Ordered: No orders of the defined types were placed in this encounter.   Medication Changes: No orders of the defined types were placed in this encounter.   Disposition:  Follow up  1 year in the [place] office.  Signed, Satira Sark, MD, [facility] 10/31/2021 5:02 PM    [facility] [place], [place], [place] [address] Phone: [phone]; Fax: [phone]

## 2021-10-31 NOTE — Patient Instructions (Signed)
You have been scheduled for an endoscopy. Please follow written instructions given to you at your visit today. If you use inhalers (even only as needed), please bring them with you on the day of your procedure.  If you are age 72 or older, your body mass index should be between 23-30. Your Body mass index is 24.48 kg/m. If this is out of the aforementioned range listed, please consider follow up with your Primary Care Provider.  If you are age 16 or younger, your body mass index should be between 19-25. Your Body mass index is 24.48 kg/m. If this is out of the aformentioned range listed, please consider follow up with your Primary Care Provider.   ________________________________________________________  The [facility] providers would like to encourage you to use [facility] to communicate with providers for non-urgent requests or questions.  Due to long hold times on the telephone, sending your provider a message by [facility] may be a faster and more efficient way to get a response.  Please allow 48 business hours for a response.  Please remember that this is for non-urgent requests.  _______________________________________________________  Thank you for choosing me and [facility].  Dr. Rush Landmark

## 2021-11-02 ENCOUNTER — Encounter: Payer: Self-pay | Admitting: Gastroenterology

## 2021-11-02 DIAGNOSIS — R6881 Early satiety: Secondary | ICD-10-CM | POA: Insufficient documentation

## 2021-11-02 DIAGNOSIS — R634 Abnormal weight loss: Secondary | ICD-10-CM | POA: Insufficient documentation

## 2021-11-02 DIAGNOSIS — R935 Abnormal findings on diagnostic imaging of other abdominal regions, including retroperitoneum: Secondary | ICD-10-CM | POA: Insufficient documentation

## 2021-11-02 DIAGNOSIS — R630 Anorexia: Secondary | ICD-10-CM | POA: Insufficient documentation

## 2021-11-02 DIAGNOSIS — D135 Benign neoplasm of extrahepatic bile ducts: Secondary | ICD-10-CM | POA: Insufficient documentation

## 2021-11-02 NOTE — Progress Notes (Signed)
[place] VISIT   Primary Care Provider Asencion Noble, MD [address] [place] [address] [phone]  Referring Provider Dr. Laural Golden  Patient Profile: [name] is a 70 y.o. male with a pmh significant for CAD (status post CABG), hypertension, hyperlipidemia, colon polyps, GERD, recent complicated necrotizing pancreatitis (etiology unclear drug-induced from Lyrica versus microlithiasis (no cholelithiasis on imaging) and very minimal alcohol consumption) with walled off necrosis/peripancreatic fluid collection development.  The patient presents to the [facility] for an evaluation and management of problem(s) noted below:  Problem List 1. Acute pancreatitis with uninfected necrosis, unspecified pancreatitis type   2. Pancreatic pseudocyst   3. Early satiety   4. Unintentional weight loss   5. Anorexia   6. Abnormal CT of the pancreas   7. Adenomyomatosis of gallbladder   8. Alkaline phosphatase elevation     History of Present Illness This is the patient's first visit to the outpatient [place] clinic.  In August the patient was admitted with pancreatitis.  He had multiple scans and ultrasound imaging performed that showed a progression of his pancreatitis with concern for necrotizing pancreatitis.  He was able to be discharged eventually home.  The etiology was not clear as it was hypothesized that Lyrica or microlithiasis could be a etiology though some alcohol consumption on a weekly basis is present it was felt to be less likely (4 drinks per week).  Once he was discharged he was doing well on a low-fat diet.  He was evaluated by Dr. Melony Overly at the end of October and at that time he had undergone repeat imaging due to some mild abdominal pain with the results showing a maturing pseudocyst with likely walled off necrosis.  The patient was subsequently referred for further evaluation.  Patient has done relatively well without  significant amounts of abdominal pain at this time.  What he does feel is a fullness.  He has early satiety.  He is not nauseated and is not vomiting.  However he has had a persistent continued weight loss.  He wonders, as is his wife, whether this is a result of the very low-fat diet that he has been maintaining.  He is worried about continuing to lose weight.  He has never had an upper endoscopy.  He eats about half of the plate that he would normally have the previously.  The patient denies any issues with jaundice, scleral icterus, generalized pruritus, darkened/amber urine, clay-colored stools, LE edema, hematemesis, coffee-ground emesis, abdominal distention, confusion.  GI Review of Systems Positive as above including bloating at times and medically controlled pyrosis and mild anorexia Negative for dysphagia, odynophagia, alteration of bowel habits, melena, hematochezia  Review of Systems General: Denies fevers/chills HEENT: Denies oral lesions Cardiovascular: Denies chest pain Pulmonary: Denies shortness of breath Gastroenterological: See HPI Genitourinary: Denies darkened urine Hematological: Denies easy bruising/bleeding Dermatological: Denies jaundice Psychological: Mood is stable though anxious to continue to get better   Medications Current Outpatient Medications  Medication Sig Dispense Refill   acetaminophen (TYLENOL) 325 MG tablet Take 650 mg by mouth daily as needed for moderate pain or headache.     aspirin 81 MG tablet Take 1 tablet (81 mg total) by mouth daily. 30 tablet    baclofen 5 MG TABS Take 5 mg by mouth 2 (two) times daily as needed for muscle spasms. 30 each 0   irbesartan (AVAPRO) 75 MG tablet Take 37.5 mg by mouth daily.     metoprolol tartrate (LOPRESSOR)  25 MG tablet Take 12.5 mg by mouth every morning.     Multiple Vitamins-Minerals (MULTIVITAMIN WITH MINERALS) tablet Take 1 tablet by mouth daily.     oxyCODONE (OXY IR/ROXICODONE) 5 MG immediate release  tablet Take 1 tablet (5 mg total) by mouth every 6 (six) hours as needed for moderate pain. 30 tablet 0   pantoprazole (PROTONIX) 40 MG tablet Take 1 tablet (40 mg total) by mouth 2 (two) times daily. 60 tablet 0   polyethylene glycol (MIRALAX / GLYCOLAX) 17 g packet Take 17 g by mouth daily. 14 each 0   HM CLEARLAX 17 GM/SCOOP powder SMARTSIG:1 Capful(s) By Mouth Daily     No current facility-administered medications for this visit.    Allergies Allergies  Allergen Reactions   Gabapentin Other (See Comments)    fatigue    Histories Past Medical History:  Diagnosis Date   Colon polyps    benign per pt   Coronary artery disease    a. s/p CABG in 2014 with LIMA-LAD and SVG-D1 - performed in [place]   Fracture, ribs    Hyperlipidemia    Hypertension    Pancreatitis    Peripheral neuropathy    Past Surgical History:  Procedure Laterality Date   CATARACT EXTRACTION W/PHACO Right 02/15/2021   Procedure: CATARACT EXTRACTION PHACO AND INTRAOCULAR LENS PLACEMENT ([place]);  Surgeon: Baruch Goldmann, MD;  Location: [facility];  Service: Ophthalmology;  Laterality: Right;  CDE 8.65   CATARACT EXTRACTION W/PHACO Left 03/04/2021   Procedure: CATARACT EXTRACTION PHACO AND INTRAOCULAR LENS PLACEMENT ([facility]);  Surgeon: Baruch Goldmann, MD;  Location: [facility];  Service: Ophthalmology;  Laterality: Left;  CDE: 8.15   COLONOSCOPY N/A 03/11/2017   [name]: 2 sessile serrated polyps removed, next colonoscopy in 5 years   CORONARY ARTERY BYPASS GRAFT  2014   HERNIA REPAIR Left 2010   inguinal   POLYPECTOMY  03/11/2017   Procedure: POLYPECTOMY;  Surgeon: Rogene Houston, MD;  Location[facility];  Service: Endoscopy;;  colon   ROTATOR CUFF REPAIR Right 2014   vericous veins  2001   Social History   Socioeconomic History   Marital status: Divorced    Spouse name: Not on file   Number of children: 2   Years of education: 16   Highest education level: Not on file  Occupational History    Comment: retired   Tobacco Use   Smoking status: Never   Smokeless tobacco: Never  Vaping Use   Vaping Use: Never used  Substance and Sexual Activity   Alcohol use: Not Currently   Drug use: No   Sexual activity: Not on file  Other Topics Concern   Not on file  Social History Narrative   Lives with sig other   caffeine-  A little   Social Determinants of Health   Financial Resource Strain: Not on file  Food Insecurity: Not on file  Transportation Needs: Not on file  Physical Activity: Not on file  Stress: Not on file  Social Connections: Not on file  Intimate Partner Violence: Not on file   Family History  Problem Relation Age of Onset   Stroke Mother    Heart disease Father    Clotting disorder Father    Dementia Sister    Thyroid disease Sister    Hypertension Sister    Colon cancer Neg Hx    Esophageal cancer Neg Hx    Rectal cancer Neg Hx    Inflammatory bowel disease Neg Hx  Liver disease Neg Hx    Pancreatic cancer Neg Hx    Stomach cancer Neg Hx    I have reviewed his medical, social, and family history in detail and updated the electronic medical record as necessary.    PHYSICAL EXAMINATION  BP 90/60   Pulse 76   Ht 5\' 8"  (1.727 m)   Wt 161 lb (73 kg)   BMI 24.48 kg/m  Wt Readings from Last 3 Encounters:  10/31/21 161 lb 12.8 oz (73.4 kg)  10/31/21 161 lb (73 kg)  10/17/21 162 lb 9.6 oz (73.8 kg)  GEN: NAD, appears stated age, doesn't appear chronically ill, accompanied by wife PSYCH: Cooperative, without pressured speech EYE: Conjunctivae pink, sclerae anicteric ENT: MMM CV: Nontachycardic RESP: No audible wheezing GI: NABS, soft, NT/ND, without rebound or guarding MSK/EXT: No lower extremity edema SKIN: No jaundice NEURO:  Alert & Oriented x 3, no focal deficits   REVIEW OF DATA  I reviewed the following data at the time of this encounter:  GI Procedures and Studies  No relevant studies to review  Laboratory Studies  Reviewed those in epic and care  everywhere  Imaging Studies  August 2022 CT abdomen pelvis with contrast IMPRESSION: Findings compatible with acute pancreatitis.Mild gland hypoenhancement of the neck/body of the pancreas, concerning for necrosis.  August 2022 right upper quadrant ultrasound IMPRESSION: Fatty liver. Adenomyomatosis of the gallbladder. No acute abnormality is noted.  August 2022 CT abdomen pelvis with contrast IMPRESSION: 1. Acute pancreatitis. No drainable fluid collection/abscess or pseudocyst. 2. Small bilateral pleural effusions with partial compressive atelectasis of the lower lobes, new since the prior CT. Pneumonia is not excluded. Clinical correlation is recommended. 3. Small ascites, increased since the prior CT. 4. No bowel obstruction. Normal appendix. 5. Aortic Atherosclerosis (ICD10-I70.0).  August 2022 CT abdomen pelvis with contrast IMPRESSION: 1. Worsening inflammatory changes of the pancreas, with decreasing parenchymal enhancement within the pancreatic head and body. Findings are compatible with progressive acute necrotizing pancreatitis. No fluid collection, pseudocyst, or abscess at this time. 2. Small volume ascites, stable. 3. Extrinsic compression upon the confluence of the SMV and splenic vein. These vessels as well as the portal vein remain patent. 4. Stable small bilateral pleural effusions and bibasilar atelectasis. 5. Segmental dilation with gas fluid levels in the mid jejunum compatible with ileus. No evidence of high-grade obstruction. 6.  Aortic Atherosclerosis (ICD10-I70.0).  October 2022 CT abdomen pancreas protocol IMPRESSION: 1. Improved appearance of edema throughout the pancreas with decreased pancreatic edema and peripancreatic inflammatory fat stranding. 2. Maturing pseudocyst is identified overlying the head, neck, body and proximal tail of pancreas. This appears more organized with a thin enhancing rim with central decreased attenuation compared with the  previous exam. This extends up to and contacts the posterior wall of stomach. 3. The pancreatic pseudocyst encases and narrows the extrahepatic portal vein which appears focally narrowed proximal to the portal venous confluence. No signs of portal vein thrombosis however. 4. Mild increase caliber of the duodenum and proximal jejunum which measure up to 2.9 cm. Likely reflecting focal ileus secondary to pancreatic inflammation. 5. Aortic Atherosclerosis (ICD10-I70.0).   ASSESSMENT  Mr. Cueto is a 70 y.o. male with a pmh significant for CAD (status post CABG), hypertension, hyperlipidemia, colon polyps, GERD, recent complicated necrotizing pancreatitis (etiology unclear drug-induced from Lyrica versus microlithiasis (no cholelithiasis on imaging) and very minimal alcohol consumption) with walled off necrosis/peripancreatic fluid collection development.  The patient is seen today for evaluation and management of:  1.  Acute pancreatitis with uninfected necrosis, unspecified pancreatitis type   2. Pancreatic pseudocyst   3. Early satiety   4. Unintentional weight loss   5. Anorexia   6. Abnormal CT of the pancreas   7. Adenomyomatosis of gallbladder   8. Alkaline phosphatase elevation    The patient is hemodynamically stable.  Clinically, he is not having significant abdominal pain but he does have evidence of what may be symptoms suggestive of symptomatic walled off necrosis/peripancreatic fluid collection development.  He has had an unintentional weight loss that has persisted as well as early satiety.  He has been following a very strict, very bland low-fat diet.  I recommended the potential increase of lean meats such as ground [place] and beef as long as they are removed of any significant fat and in small portions see if that may help the patient with some weight loss issues.  The etiology of his severe pancreatitis was felt to be Lyrica induced or microlithiasis induced (as no evidence of  cholelithiasis on ultrasound).  The patient did drink alcohol on a weekly basis but the amount of alcohol that he has described should not be an etiology for pancreatitis in most circumstances so I agree with that.  Patient does take irbesartan, for years, and it is a class III medication that has been associated with pancreatitis.  It will need to be considered if the patient has another episode of pancreatitis to be transitioned.  I think the patient does meet criteria for consideration of cyst gastrostomy creation.  I think there is a chance he will need direct endoscopic necrosectomy as well.  The risks of an EUS, including intestinal perforation, bleeding, infection, aspiration, and medication effects were discussed.  When a cystgastrostomy/cystenterostomy is performed as part of the EUS, there is an additional risk of pancreatitis at the rate of about 1-2%.  It was explained that procedure related pancreatitis is typically mild, although at times it can be severe and even life threatening.  The patient and wife are interested in further evaluation and treatment.  The risks and benefits of endoscopic evaluation were discussed with the patient; these include but are not limited to the risk of perforation, infection, bleeding, missed lesions, lack of diagnosis, severe illness requiring hospitalization, as well as anesthesia and sedation related illnesses.  The patient and/or family is agreeable to proceed.  We will move forward with scheduling.  All patient questions were answered to the best of my ability, and the patient agrees to the aforementioned plan of action with follow-up as indicated.   PLAN  Proceed with scheduling EGD/EUS with attempt at cyst gastrostomy Patient may require DEN procedures pending amount of necrosis noted Patient will remain on a low-fat diet but is able to advance to lean cuts of meat including beef and ground [place] Will be hard to evaluate the gallbladder for microlithiasis  at this time but will consider in future pending likely drainage of pseudocyst/walled off necrosis Will need to consider irbesartan transition if any other episodes of pancreatitis develop Continue to minimize any alcohol intake at this time   Orders Placed This Encounter  Procedures   Procedural/ Surgical Case Request: UPPER ESOPHAGEAL ENDOSCOPIC ULTRASOUND (EUS)   Ambulatory referral to Gastroenterology    New Prescriptions   No medications on file   Modified Medications   No medications on file    Planned Follow Up No follow-ups on file.   Total Time in Face-to-Face and in Coordination of Care for patient including  independent/personal interpretation/review of prior testing, medical history, examination, medication adjustment, imaging evaluation with patient and family, communicating results with the patient directly, and documentation within the EHR is 45 minutes.   Justice Britain, MD [facility] Advanced Endoscopy Office # [phone]

## 2021-11-27 ENCOUNTER — Other Ambulatory Visit: Payer: Self-pay

## 2021-11-29 ENCOUNTER — Telehealth: Payer: Self-pay | Admitting: Gastroenterology

## 2021-11-29 NOTE — Telephone Encounter (Signed)
Inbound call from patient have procedure 12/19 at [facility]. Have questions if he should stop baby aspirin beforehand

## 2021-11-29 NOTE — Telephone Encounter (Signed)
The pt has been advised that he does not need to stop ASA.  He was told to call back with any further questions or concerns

## 2021-12-02 DIAGNOSIS — Z85828 Personal history of other malignant neoplasm of skin: Secondary | ICD-10-CM | POA: Diagnosis not present

## 2021-12-02 DIAGNOSIS — Z08 Encounter for follow-up examination after completed treatment for malignant neoplasm: Secondary | ICD-10-CM | POA: Diagnosis not present

## 2021-12-08 NOTE — Anesthesia Preprocedure Evaluation (Addendum)
Anesthesia Evaluation  Patient identified by MRN, date of birth, ID band Patient awake    Reviewed: Allergy & Precautions, NPO status , Patient's Chart, lab work & pertinent test results, reviewed documented beta blocker date and time   History of Anesthesia Complications Negative for: history of anesthetic complications  Airway Mallampati: II  TM Distance: >3 FB Neck ROM: Full    Dental no notable dental hx.    Pulmonary neg pulmonary ROS,    Pulmonary exam normal        Cardiovascular hypertension, Pt. on medications and Pt. on home beta blockers + CAD and + CABG (2014)  Normal cardiovascular exam  TTE 2020: EF 60-65%, grade I DD, mild RAE, mild MR/TR/PR, mild dilation of aortic root    Neuro/Psych negative neurological ROS  negative psych ROS   GI/Hepatic Neg liver ROS, GERD  Medicated and Controlled,Acute pancreatitis   Endo/Other  negative endocrine ROS  Renal/GU negative Renal ROS  negative genitourinary   Musculoskeletal negative musculoskeletal ROS (+)   Abdominal   Peds  Hematology negative hematology ROS (+)   Anesthesia Other Findings Day of surgery medications reviewed with patient.  Reproductive/Obstetrics negative OB ROS                            Anesthesia Physical Anesthesia Plan  ASA: 3  Anesthesia Plan: General   Post-op Pain Management: Minimal or no pain anticipated   Induction:   PONV Risk Score and Plan: 2 and Treatment may vary due to age or medical condition, Ondansetron and Dexamethasone  Airway Management Planned: Oral ETT  Additional Equipment: None  Intra-op Plan:   Post-operative Plan: Extubation in OR  Informed Consent: I have reviewed the patients History and Physical, chart, labs and discussed the procedure including the risks, benefits and alternatives for the proposed anesthesia with the patient or authorized representative who has  indicated his/her understanding and acceptance.     Dental advisory given  Plan Discussed with: CRNA  Anesthesia Plan Comments:        Anesthesia Quick Evaluation

## 2021-12-09 ENCOUNTER — Ambulatory Visit (HOSPITAL_COMMUNITY)
Admission: RE | Admit: 2021-12-09 | Discharge: 2021-12-09 | Disposition: A | Discharge status: Home / Self Care | Payer: PPO | Attending: Gastroenterology | Admitting: Gastroenterology

## 2021-12-09 ENCOUNTER — Ambulatory Visit (HOSPITAL_COMMUNITY): Payer: PPO | Admitting: Anesthesiology

## 2021-12-09 ENCOUNTER — Encounter (HOSPITAL_COMMUNITY): Payer: Self-pay | Admitting: Gastroenterology

## 2021-12-09 ENCOUNTER — Encounter (HOSPITAL_COMMUNITY)
Admission: RE | Disposition: A | Discharge status: Home / Self Care | Payer: Self-pay | Source: Home / Self Care | Attending: Gastroenterology

## 2021-12-09 ENCOUNTER — Other Ambulatory Visit: Payer: Self-pay

## 2021-12-09 ENCOUNTER — Telehealth: Payer: Self-pay

## 2021-12-09 DIAGNOSIS — K859 Acute pancreatitis without necrosis or infection, unspecified: Secondary | ICD-10-CM | POA: Insufficient documentation

## 2021-12-09 DIAGNOSIS — K219 Gastro-esophageal reflux disease without esophagitis: Secondary | ICD-10-CM | POA: Diagnosis not present

## 2021-12-09 DIAGNOSIS — R6881 Early satiety: Secondary | ICD-10-CM | POA: Diagnosis not present

## 2021-12-09 DIAGNOSIS — R935 Abnormal findings on diagnostic imaging of other abdominal regions, including retroperitoneum: Secondary | ICD-10-CM

## 2021-12-09 DIAGNOSIS — K2970 Gastritis, unspecified, without bleeding: Secondary | ICD-10-CM | POA: Diagnosis not present

## 2021-12-09 DIAGNOSIS — Z951 Presence of aortocoronary bypass graft: Secondary | ICD-10-CM | POA: Insufficient documentation

## 2021-12-09 DIAGNOSIS — K863 Pseudocyst of pancreas: Secondary | ICD-10-CM | POA: Diagnosis not present

## 2021-12-09 DIAGNOSIS — K2289 Other specified disease of esophagus: Secondary | ICD-10-CM | POA: Diagnosis not present

## 2021-12-09 DIAGNOSIS — I10 Essential (primary) hypertension: Secondary | ICD-10-CM | POA: Insufficient documentation

## 2021-12-09 DIAGNOSIS — K449 Diaphragmatic hernia without obstruction or gangrene: Secondary | ICD-10-CM | POA: Diagnosis not present

## 2021-12-09 DIAGNOSIS — K862 Cyst of pancreas: Secondary | ICD-10-CM | POA: Diagnosis not present

## 2021-12-09 DIAGNOSIS — K3189 Other diseases of stomach and duodenum: Secondary | ICD-10-CM | POA: Insufficient documentation

## 2021-12-09 DIAGNOSIS — B3789 Other sites of candidiasis: Secondary | ICD-10-CM | POA: Diagnosis not present

## 2021-12-09 DIAGNOSIS — I2510 Atherosclerotic heart disease of native coronary artery without angina pectoris: Secondary | ICD-10-CM | POA: Insufficient documentation

## 2021-12-09 DIAGNOSIS — R634 Abnormal weight loss: Secondary | ICD-10-CM

## 2021-12-09 DIAGNOSIS — K8591 Acute pancreatitis with uninfected necrosis, unspecified: Secondary | ICD-10-CM

## 2021-12-09 HISTORY — PX: DUODENAL STENT PLACEMENT: SHX5541

## 2021-12-09 HISTORY — PX: BALLOON DILATION: SHX5330

## 2021-12-09 HISTORY — PX: ESOPHAGOGASTRODUODENOSCOPY: SHX5428

## 2021-12-09 HISTORY — PX: UPPER ESOPHAGEAL ENDOSCOPIC ULTRASOUND (EUS): SHX6562

## 2021-12-09 HISTORY — PX: BIOPSY: SHX5522

## 2021-12-09 HISTORY — PX: BILIARY STENT PLACEMENT: SHX5538

## 2021-12-09 LAB — CBC WITH DIFFERENTIAL/PLATELET
Abs Immature Granulocytes: 0.01 10*3/uL (ref 0.00–0.07)
Basophils Absolute: 0 10*3/uL (ref 0.0–0.1)
Basophils Relative: 1 %
Eosinophils Absolute: 0.2 10*3/uL (ref 0.0–0.5)
Eosinophils Relative: 4 %
HCT: 36.2 % — ABNORMAL LOW (ref 39.0–52.0)
Hemoglobin: 11.7 g/dL — ABNORMAL LOW (ref 13.0–17.0)
Immature Granulocytes: 0 %
Lymphocytes Relative: 12 %
Lymphs Abs: 0.6 10*3/uL — ABNORMAL LOW (ref 0.7–4.0)
MCH: 26.2 pg (ref 26.0–34.0)
MCHC: 32.3 g/dL (ref 30.0–36.0)
MCV: 81.2 fL (ref 80.0–100.0)
Monocytes Absolute: 0.2 10*3/uL (ref 0.1–1.0)
Monocytes Relative: 4 %
Neutro Abs: 4.2 10*3/uL (ref 1.7–7.7)
Neutrophils Relative %: 79 %
Platelets: 195 10*3/uL (ref 150–400)
RBC: 4.46 MIL/uL (ref 4.22–5.81)
RDW: 16.8 % — ABNORMAL HIGH (ref 11.5–15.5)
WBC: 5.2 10*3/uL (ref 4.0–10.5)
nRBC: 0 % (ref 0.0–0.2)

## 2021-12-09 LAB — COMPREHENSIVE METABOLIC PANEL
ALT: 270 U/L — ABNORMAL HIGH (ref 0–44)
AST: 121 U/L — ABNORMAL HIGH (ref 15–41)
Albumin: 3.6 g/dL (ref 3.5–5.0)
Alkaline Phosphatase: 429 U/L — ABNORMAL HIGH (ref 38–126)
Anion gap: 10 (ref 5–15)
BUN: 18 mg/dL (ref 8–23)
CO2: 21 mmol/L — ABNORMAL LOW (ref 22–32)
Calcium: 9.1 mg/dL (ref 8.9–10.3)
Chloride: 105 mmol/L (ref 98–111)
Creatinine, Ser: 0.74 mg/dL (ref 0.61–1.24)
GFR, Estimated: >60 mL/min (ref >60)
Glucose, Bld: 102 mg/dL — ABNORMAL HIGH (ref 70–99)
Potassium: 4.2 mmol/L (ref 3.5–5.1)
Sodium: 136 mmol/L (ref 135–145)
Total Bilirubin: 4.1 mg/dL — ABNORMAL HIGH (ref 0.3–1.2)
Total Protein: 6.3 g/dL — ABNORMAL LOW (ref 6.5–8.1)

## 2021-12-09 SURGERY — UPPER ESOPHAGEAL ENDOSCOPIC ULTRASOUND (EUS)
Anesthesia: Monitor Anesthesia Care

## 2021-12-09 MED ORDER — CIPROFLOXACIN HCL 500 MG PO TABS: 500.0000 mg | ORAL_TABLET | Freq: Two times a day (BID) | ORAL | 0 refills | Status: AC

## 2021-12-09 MED ORDER — FENTANYL CITRATE (PF) 100 MCG/2ML IJ SOLN
INTRAMUSCULAR | Status: AC
  Filled 2021-12-09: qty 2

## 2021-12-09 MED ORDER — LACTATED RINGERS IV SOLN
INTRAVENOUS | Status: DC
  Administered 2021-12-09: 07:00:00 1000 mL via INTRAVENOUS

## 2021-12-09 MED ORDER — ONDANSETRON HCL 4 MG/2ML IJ SOLN
INTRAMUSCULAR | Status: DC | PRN
  Administered 2021-12-09: 4 mg via INTRAVENOUS

## 2021-12-09 MED ORDER — DEXAMETHASONE SODIUM PHOSPHATE 10 MG/ML IJ SOLN
INTRAMUSCULAR | Status: DC | PRN
  Administered 2021-12-09: 5 mg via INTRAVENOUS

## 2021-12-09 MED ORDER — SODIUM CHLORIDE 0.9 % IV SOLN: INTRAVENOUS | Status: DC

## 2021-12-09 MED ORDER — PROPOFOL 10 MG/ML IV BOLUS
INTRAVENOUS | Status: DC | PRN
  Administered 2021-12-09: 140 mg via INTRAVENOUS

## 2021-12-09 MED ORDER — ROCURONIUM BROMIDE 10 MG/ML (PF) SYRINGE
PREFILLED_SYRINGE | INTRAVENOUS | Status: DC | PRN
  Administered 2021-12-09: 10 mg via INTRAVENOUS
  Administered 2021-12-09: 20 mg via INTRAVENOUS

## 2021-12-09 MED ORDER — PROPOFOL 10 MG/ML IV BOLUS
INTRAVENOUS | Status: AC
  Filled 2021-12-09: qty 20

## 2021-12-09 MED ORDER — EPHEDRINE SULFATE-NACL 50-0.9 MG/10ML-% IV SOSY
PREFILLED_SYRINGE | INTRAVENOUS | Status: DC | PRN
  Administered 2021-12-09 (×3): 5 mg via INTRAVENOUS

## 2021-12-09 MED ORDER — PHENYLEPHRINE 40 MCG/ML (10ML) SYRINGE FOR IV PUSH (FOR BLOOD PRESSURE SUPPORT)
PREFILLED_SYRINGE | INTRAVENOUS | Status: DC | PRN
Start: 2021-12-09 — End: 2021-12-09
  Administered 2021-12-09: 80 ug via INTRAVENOUS
  Administered 2021-12-09: 120 ug via INTRAVENOUS
  Administered 2021-12-09: 80 ug via INTRAVENOUS
  Administered 2021-12-09 (×2): 120 ug via INTRAVENOUS
  Administered 2021-12-09: 40 ug via INTRAVENOUS

## 2021-12-09 MED ORDER — LIDOCAINE 2% (20 MG/ML) 5 ML SYRINGE
INTRAMUSCULAR | Status: DC | PRN
  Administered 2021-12-09: 80 mg via INTRAVENOUS

## 2021-12-09 MED ORDER — SUGAMMADEX SODIUM 200 MG/2ML IV SOLN
INTRAVENOUS | Status: DC | PRN
Start: 2021-12-09 — End: 2021-12-09
  Administered 2021-12-09: 150 mg via INTRAVENOUS

## 2021-12-09 MED ORDER — SUCCINYLCHOLINE CHLORIDE 200 MG/10ML IV SOSY
PREFILLED_SYRINGE | INTRAVENOUS | Status: DC | PRN
  Administered 2021-12-09: 100 mg via INTRAVENOUS

## 2021-12-09 MED ORDER — CIPROFLOXACIN IN D5W 400 MG/200ML IV SOLN
INTRAVENOUS | Status: AC
  Filled 2021-12-09: qty 200

## 2021-12-09 MED ORDER — CIPROFLOXACIN IN D5W 400 MG/200ML IV SOLN
INTRAVENOUS | Status: DC | PRN
  Administered 2021-12-09: 400 mg via INTRAVENOUS

## 2021-12-09 MED ORDER — FENTANYL CITRATE (PF) 100 MCG/2ML IJ SOLN
INTRAMUSCULAR | Status: DC | PRN
  Administered 2021-12-09 (×2): 50 ug via INTRAVENOUS

## 2021-12-09 NOTE — Anesthesia Procedure Notes (Signed)
Procedure Name: Intubation Date/Time: 12/09/2021 7:54 AM Performed by: Gean Maidens, CRNA Pre-anesthesia Checklist: Patient identified, Emergency Drugs available, Suction available, Patient being monitored and Timeout performed Patient Re-evaluated:Patient Re-evaluated prior to induction Oxygen Delivery Method: Circle system utilized Preoxygenation: Pre-oxygenation with 100% oxygen Induction Type: IV induction Ventilation: Mask ventilation without difficulty Laryngoscope Size: Mac and 4 Grade View: Grade I Tube type: Oral Tube size: 7.5 mm Number of attempts: 1 Airway Equipment and Method: Stylet Placement Confirmation: ETT inserted through vocal cords under direct vision, positive ETCO2 and breath sounds checked- equal and bilateral Secured at: 23 cm Tube secured with: Tape Dental Injury: Teeth and Oropharynx as per pre-operative assessment

## 2021-12-09 NOTE — H&P (Signed)
GASTROENTEROLOGY PROCEDURE H&P NOTE   Primary Care Physician: Asencion Noble, MD  HPI: [name] is a 70 y.o. male who presents for EGD/EUS for evaluation of pancreatic cyst and possible cyst gastrostomy/cyst enterostomy creation.  Patient states in the last 4 days he has started to notice darkened urine outside of the ordinary.  He continues to have early satiety and expresses another 5 pound weight loss.  Overall concerning for pancreatic cyst enlargement or now the possibility of compression of the biliary tree.  Past Medical History:  Diagnosis Date   Colon polyps    benign per pt   Coronary artery disease    a. s/p CABG in 2014 with LIMA-LAD and SVG-D1 - performed in [place]   Fracture, ribs    Hyperlipidemia    Hypertension    Pancreatitis    Peripheral neuropathy    Past Surgical History:  Procedure Laterality Date   CATARACT EXTRACTION W/PHACO Right 02/15/2021   Procedure: CATARACT EXTRACTION PHACO AND INTRAOCULAR LENS PLACEMENT ([place]);  Surgeon: Baruch Goldmann, MD;  Location: [facility];  Service: Ophthalmology;  Laterality: Right;  CDE 8.65   CATARACT EXTRACTION W/PHACO Left 03/04/2021   Procedure: CATARACT EXTRACTION PHACO AND INTRAOCULAR LENS PLACEMENT ([facility]);  Surgeon: Baruch Goldmann, MD;  Location: [facility];  Service: Ophthalmology;  Laterality: Left;  CDE: 8.15   COLONOSCOPY N/A 03/11/2017   [name]: 2 sessile serrated polyps removed, next colonoscopy in 5 years   CORONARY ARTERY BYPASS GRAFT  2014   HERNIA REPAIR Left 2010   inguinal   POLYPECTOMY  03/11/2017   Procedure: POLYPECTOMY;  Surgeon: Rogene Houston, MD;  Location: [facility];  Service: Endoscopy;;  colon   ROTATOR CUFF REPAIR Right 2014   vericous veins  2001   Current Facility-Administered Medications  Medication Dose Route Frequency Provider Last Rate Last Admin   0.9 %  sodium chloride infusion   Intravenous Continuous Mansouraty, Telford Nab., MD       lactated ringers infusion   Intravenous Continuous  Mansouraty, Telford Nab., MD 10 mL/hr at 12/09/21 0657 1,000 mL at 12/09/21 0657    Current Facility-Administered Medications:    0.9 %  sodium chloride infusion, , Intravenous, Continuous, Mansouraty, Telford Nab., MD   lactated ringers infusion, , Intravenous, Continuous, Mansouraty, Telford Nab., MD, Last Rate: 10 mL/hr at 12/09/21 0657, 1,000 mL at 12/09/21 1941 Allergies  Allergen Reactions   Gabapentin Other (See Comments)    fatigue   Family History  Problem Relation Age of Onset   Stroke Mother    Heart disease Father    Clotting disorder Father    Dementia Sister    Thyroid disease Sister    Hypertension Sister    Colon cancer Neg Hx    Esophageal cancer Neg Hx    Rectal cancer Neg Hx    Inflammatory bowel disease Neg Hx    Liver disease Neg Hx    Pancreatic cancer Neg Hx    Stomach cancer Neg Hx    Social History   Socioeconomic History   Marital status: Divorced    Spouse name: Not on file   Number of children: 2   Years of education: 16   Highest education level: Not on file  Occupational History    Comment: retired  Tobacco Use   Smoking status: Never   Smokeless tobacco: Never  Vaping Use   Vaping Use: Never used  Substance and Sexual Activity   Alcohol use: Not Currently   Drug use: No  Sexual activity: Not on file  Other Topics Concern   Not on file  Social History Narrative   Lives with sig other   caffeine-  A little   Social Determinants of Health   Financial Resource Strain: Not on file  Food Insecurity: Not on file  Transportation Needs: Not on file  Physical Activity: Not on file  Stress: Not on file  Social Connections: Not on file  Intimate Partner Violence: Not on file    Physical Exam: Today's Vitals   11/27/21 1125 12/09/21 0643  BP:  134/69  Pulse:  63  Resp:  11  Temp:  98 F (36.7 C)  TempSrc:  Oral  SpO2:  100%  Weight: 69.4 kg 69.4 kg  Height: 5\' 8"  (1.727 m) 5\' 8"  (1.727 m)  PainSc:  0-No pain   Body mass  index is 23.26 kg/m. GEN: NAD EYE: Sclerae icteric ENT: MMM CV: Non-tachycardic GI: Soft, NT/ND NEURO:  Alert & Oriented x 3  Lab Results: No results for input(s): WBC, HGB, HCT, PLT in the last 72 hours. BMET No results for input(s): NA, K, CL, CO2, GLUCOSE, BUN, CREATININE, CALCIUM in the last 72 hours. LFT No results for input(s): PROT, ALBUMIN, AST, ALT, ALKPHOS, BILITOT, BILIDIR, IBILI in the last 72 hours. PT/INR No results for input(s): LABPROT, INR in the last 72 hours.   Impression / Plan: This is a 70 y.o.male who presents for EGD/EUS for evaluation of pancreatic cyst and possible cyst gastrostomy/cyst enterostomy creation.  Patient states in the last 4 days he has started to notice darkened urine outside of the ordinary.  He continues to have early satiety and expresses another 5 pound weight loss.  Overall concerning for pancreatic cyst enlargement or now the possibility of compression of the biliary tree.  The risks of an EUS including intestinal perforation, bleeding, infection, aspiration, and medication effects were discussed as was the possibility it may not give a definitive diagnosis if a biopsy is performed.  When a biopsy of the pancreas is done as part of the EUS, there is an additional risk of pancreatitis at the rate of about 1-2%.  It was explained that procedure related pancreatitis is typically mild, although it can be severe and even life threatening, which is why we do not perform random pancreatic biopsies and only biopsy a lesion/area we feel is concerning enough to warrant the risk.  The risks and benefits of endoscopic evaluation/treatment were discussed with the patient and/or family; these include but are not limited to the risk of perforation, infection, bleeding, missed lesions, lack of diagnosis, severe illness requiring hospitalization, as well as anesthesia and sedation related illnesses.  The patient's history has been reviewed, patient examined, no  change in status, and deemed stable for procedure.  The patient and/or family is agreeable to proceed.    Justice Britain, MD [facility] Office # [phone]

## 2021-12-09 NOTE — Telephone Encounter (Signed)
I spoke with the pt and he has been advised that he can restart his pantoprazole and BP meds.  He will also come in Thursday or Friday for labs. The order has been entered as stat.  He will call tomorrow to update on his condition.  He will continue abx BID.  He was also given the lab results that have resulted.

## 2021-12-09 NOTE — Discharge Instructions (Signed)
YOU HAD AN ENDOSCOPIC PROCEDURE TODAY: Refer to the procedure report and other information in the discharge instructions given to you for any specific questions about what was found during the examination. If this information does not answer your questions, please call [facility] office at [phone] to clarify.   YOU SHOULD EXPECT: Some feelings of bloating in the abdomen. Passage of more gas than usual. Walking can help get rid of the air that was put into your GI tract during the procedure and reduce the bloating. If you had a lower endoscopy (such as a colonoscopy or flexible sigmoidoscopy) you may notice spotting of blood in your stool or on the toilet paper. Some abdominal soreness may be present for a day or two, also.  DIET: Your first meal following the procedure should be a light meal and then it is ok to progress to your normal diet. A half-sandwich or bowl of soup is an example of a good first meal. Heavy or fried foods are harder to digest and may make you feel nauseous or bloated. Drink plenty of fluids but you should avoid alcoholic beverages for 24 hours. If you had a esophageal dilation, please see attached instructions for diet.    ACTIVITY: Your care partner should take you home directly after the procedure. You should plan to take it easy, moving slowly for the rest of the day. You can resume normal activity the day after the procedure however YOU SHOULD NOT DRIVE, use power tools, machinery or perform tasks that involve climbing or major physical exertion for 24 hours (because of the sedation medicines used during the test).   SYMPTOMS TO REPORT IMMEDIATELY: A gastroenterologist can be reached at any hour. Please call [phone]  for any of the following symptoms:   Following upper endoscopy (EGD, EUS, ERCP, esophageal dilation) Vomiting of blood or coffee ground material  New, significant abdominal pain  New, significant chest pain or pain under the shoulder blades  Painful or  persistently difficult swallowing  New shortness of breath  Black, tarry-looking or red, bloody stools  FOLLOW UP:  If any biopsies were taken you will be contacted by phone or by letter within the next 1-3 weeks. Call [phone]  if you have not heard about the biopsies in 3 weeks.  Please also call with any specific questions about appointments or follow up tests.  

## 2021-12-09 NOTE — Transfer of Care (Signed)
Immediate Anesthesia Transfer of Care Note  Patient: Virl Son  Procedure(s) Performed: UPPER ESOPHAGEAL ENDOSCOPIC ULTRASOUND (EUS) ESOPHAGOGASTRODUODENOSCOPY (EGD) BIOPSY AXIOS STENT PLACEMENT BALLOON DILATION  Patient Location: PACU  Anesthesia Type:General  Level of Consciousness: awake, alert  and oriented  Airway & Oxygen Therapy: Patient Spontanous Breathing and Patient connected to face mask oxygen  Post-op Assessment: Report given to RN and Post -op Vital signs reviewed and stable  Post vital signs: Reviewed and stable  Last Vitals:  Vitals Value Taken Time  BP    Temp    Pulse    Resp    SpO2      Last Pain:  Vitals:   12/09/21 0643  TempSrc: Oral  PainSc: 0-No pain         Complications: No notable events documented.

## 2021-12-09 NOTE — Op Note (Signed)
[facility] Patient Name: [name] Procedure Date: 12/09/2021 MRN: [id number] Attending MD: Justice Britain , MD Date of Birth: [birth year] CSN: [id number] Age: 70 Admit Type: Outpatient Procedure:                Upper EUS Indications:              Acute pancreatitis, Abdominal distress, Weight                            loss, For cyst enterostomy, Early satiety Providers:                Justice Britain, MD, Doristine Johns, RN,                            Frazier Richards, Technician, Herbie Drape, CRNA Referring MD:             Hildred Laser, MD, Asencion Noble Medicines:                General Anesthesia, Cipro 427 mg IV Complications:            No immediate complications. Estimated Blood Loss:     Estimated blood loss was minimal. Procedure:                Pre-Anesthesia Assessment:                           - Prior to the procedure, a History and Physical                            was performed, and patient medications and                            allergies were reviewed. The patient's tolerance of                            previous anesthesia was also reviewed. The risks                            and benefits of the procedure and the sedation                            options and risks were discussed with the patient.                            All questions were answered, and informed consent                            was obtained. Prior Anticoagulants: The patient has                            taken no previous anticoagulant or antiplatelet                            agents except for aspirin. ASA Grade Assessment:  III - A patient with severe systemic disease. After                            reviewing the risks and benefits, the patient was                            deemed in satisfactory condition to undergo the                            procedure.                           After obtaining informed consent, the  endoscope was                            passed under direct vision. Throughout the                            procedure, the patient's blood pressure, pulse, and                            oxygen saturations were monitored continuously. The                            [id number] ([id number]) Olympus therapeutic endoscope                            was introduced through the mouth, and advanced to                            the second part of duodenum. The [id number]-[id number]                            ([id number]) Olympus duodenoscope was introduced                            through the mouth, and advanced to the area of                            papilla. The [id number]-[id number] ([id number]) Olympus endoscope                            was introduced through the mouth, and advanced to                            the second part of duodenum. The [id number]                            ([id number]) Olympus linear ultrasound scope was                            introduced through the mouth, and advanced to the  duodenum for ultrasound examination from the                            stomach and duodenum. The upper EUS was                            accomplished without difficulty. The patient                            tolerated the procedure. Scope In: Scope Out: Findings:      ENDOSCOPIC FINDING: :      White nummular lesions were noted in the entire esophagus. Biopsies were       taken with a cold forceps for histology to rule out Candida.      The Z-line was regular and was found 37 cm from the incisors.      A 2 cm hiatal hernia was present.      Patchy moderate inflammation characterized by erythema and granularity       was found in the entire examined stomach. Biopsies were taken with a       cold forceps for histology and Helicobacter pylori testing.      No gross lesions were noted in the duodenal bulb, in the first portion       of the duodenum and in the second portion of the duodenum.       The major papilla was prominent and bulbous.      A mild extrinsic impression deformity was found in the third portion of       the duodenum.      ENDOSONOGRAPHIC FINDING: :      An anechoic lesion suggestive of a cyst/walled off necrosis was       identified in the region of the entire pancreatic head, genu of the       pancreas, pancreatic body and pancreatic tail. It is not in obvious       communication with the pancreatic duct. The lesion measured 110 mm by 90       mm in maximal cross-sectional diameter. I could visualize this cyst       within the stomach and within the duodenal bulb with a few compartments.       The outer wall of the lesion was thin. There was no associated mass.       There was some internal debris within the fluid-filled cavity consistent       with likely walled off necrosis. The decision was made to create a       cystogastrostomy using the AXIOS stent system. Once an appropriate       position in the stomach was identified, the common wall between the       stomach and the cyst was interrogated utilizing color Doppler imaging to       identify interposed vessels. The AXIOS stent and electrocautery device       was introduced through the working channel and advanced to the stomach       wall. The cyst was punctured under endosongraphic guidance using current       was to the cautery tip. A 15 x 10 mm AXIOS stent was placed with the       flanges in close approximation to the walls of the cyst and the stomach  through the cystogastrostomy. The stent was successfully placed. Suction       via Endoscope was performed and we removed 700 cc of dark, bloody       material. I dilated the AXIOS tract with an 8-10 mm dilator and then a       10-12 dilator. I saw walled off necrosis within the wall of the cavity,       but overall, did not seem to have a large amount of debris within the       cyst that was punctured. Two double pigtail stents were placed a 7 cm  x       7 Fr and 5 cm x 7 Fr into the pseudocyst through the AXIOS       cystogastrostomy. The stents were successfully placed.      Endosonographic imaging in the visualized portion of the liver showed no       mass. There did appear to be intrahepatic ductal dilation.      I could not visualize the bile duct due to the cyst within the duodenal       bulb, which is likely causing mass-effect.      The celiac region was visualized. Impression:               EGD Impression:                           - White nummular lesions in esophageal mucosa.                            Biopsied.                           - Z-line regular, 37 cm from the incisors.                           - 2 cm hiatal hernia.                           - Gastritis. Biopsied.                           - No gross lesions in the duodenal bulb, in the                            first portion of the duodenum and in the second                            portion of the duodenum.                           - Prominent, bulging major papilla.                           - Duodenal extrinsic deformity in D3 noted.                           EUS Impression:                           -  A cystic lesion was seen in the entire pancreas.                            Tissue has not been obtained. However, the                            endosonographic appearance is consistent with a                            pancreatic pseudocyst with evidence of walled-off                            necrosis. AXIOS Cystgastrostomy created. Cyst                            entered with evidence of some necrosis though felt                            <40% of the cyst that was entered. Dilated tract.                            Double pigtails placed through the AXIOS. 700 cc of                            dark bloody output removed from the cyst.                           - Evidence of what appears to be dilated                            intrahepatics are  noted on the EUS liver imaging.                           - Could not visualize bile duct due to the cyst                            within the region of the HOP. Moderate Sedation:      Not Applicable - Patient had care per Anesthesia. Recommendation:           - The patient will be observed post-procedure,                            until all discharge criteria are met.                           - Discharge patient to home.                           - Patient has a contact number available for                            emergencies. The signs and symptoms of potential  delayed complications were discussed with the                            patient. Return to normal activities tomorrow.                            Written discharge instructions were provided to the                            patient.                           - Monitor for signs/symptoms of bleeding,                            perforation, and infection. If issues please call                            our number to get further assistance as needed.                           - Observe patient's clinical course.                           - If able to stop PPI therapy for a period in time                            may help. If significant pyrosis/GERD, may be able                            to restart once daily PPI.                           - Ciprofloxacin 500 mg BID daily until repeat                            imaging is performed.                           - Should you develop progressive                            fevers/chills/severe abdominal pain/vomiting up                            blood/passing dark and blood bowel movements, call                            the office and you will be directed to the hospital.                           - CT-Abdomen/Pelvis in 2-3 weeks to evaluate how                            the area has healed.                           -  Follow up blood work  today (CMP/CBC).                           - The findings and recommendations were discussed                            with the patient.                           - The findings and recommendations were discussed                            with the patient's family. Procedure Code(s):        --- Professional ---                           [phone], Esophagogastroduodenoscopy, flexible,                            transoral; with transmural drainage of pseudocyst                            (includes placement of transmural drainage                            catheter[s]/stent[s], when performed, and                            endoscopic ultrasound, when performed)                           43237, Esophagogastroduodenoscopy, flexible,                            transoral; with endoscopic ultrasound examination                            limited to the esophagus, stomach or duodenum, and                            adjacent structures Diagnosis Code(s):        --- Professional ---                           K22.8, Other specified diseases of esophagus                           K44.9, Diaphragmatic hernia without obstruction or                            gangrene                           K29.70, Gastritis, unspecified, without bleeding                           K31.89, Other diseases of stomach and duodenum  K86.2, Cyst of pancreas                           K85.90, Acute pancreatitis without necrosis or                            infection, unspecified                           R10.9, Unspecified abdominal pain                           R63.4, Abnormal weight loss                           R68.81, Early satiety CPT copyright 2019 [employer]. All rights reserved. The codes documented in this report are preliminary and upon coder review may  be revised to meet current compliance requirements. Justice Britain, MD 12/09/2021 9:33:06 AM Number of  Addenda: 0

## 2021-12-09 NOTE — Telephone Encounter (Signed)
-----   Message from Irving Copas., MD sent at 12/09/2021 12:08 PM EST ----- Regarding: Follow-up [name], Please check on the patient tomorrow and see how he is done status post endoscopic cyst gastrostomy. Please put in orders for a CBC/CMP and have the patient come in on Thursday or Friday to have that done in the morning (stat labs). Please let him know that I reviewed his labs done today (CMP is only back at this time CBC will be pending) but his liver tests are elevated as we suspected due to his jaundice. In either case hopefully the cyst drainage will help things. He should continue his antibiotics twice daily. Thanks. GM

## 2021-12-09 NOTE — Telephone Encounter (Signed)
Patient called states he had his procedure today and has a few medications that he had to hold prior to appt and now he would like to know when he can start the medications again.

## 2021-12-09 NOTE — Anesthesia Postprocedure Evaluation (Signed)
Anesthesia Post Note  Patient: [name]  Procedure(s) Performed: UPPER ESOPHAGEAL ENDOSCOPIC ULTRASOUND (EUS) ESOPHAGOGASTRODUODENOSCOPY (EGD) BIOPSY AXIOS [place]     Patient location during evaluation: PACU Anesthesia Type: General Level of consciousness: awake and alert and oriented Pain management: pain level controlled Vital Signs Assessment: post-procedure vital signs reviewed and stable Respiratory status: spontaneous breathing, nonlabored ventilation and respiratory function stable Cardiovascular status: blood pressure returned to baseline Postop Assessment: no apparent nausea or vomiting Anesthetic complications: no   No notable events documented.  Last Vitals:  Vitals:   12/09/21 0643 12/09/21 0920  BP: 134/69 (!) 120/57  Pulse: 63 77  Resp: 11 15  Temp: 36.7 C   SpO2: 100% 100%    Last Pain:  Vitals:   12/09/21 0920  TempSrc:   PainSc: 0-No pain                 Marthenia Rolling

## 2021-12-10 ENCOUNTER — Telehealth: Payer: Self-pay | Admitting: Gastroenterology

## 2021-12-10 ENCOUNTER — Other Ambulatory Visit: Payer: Self-pay

## 2021-12-10 ENCOUNTER — Encounter: Payer: Self-pay | Admitting: Gastroenterology

## 2021-12-10 DIAGNOSIS — R634 Abnormal weight loss: Secondary | ICD-10-CM

## 2021-12-10 DIAGNOSIS — R6881 Early satiety: Secondary | ICD-10-CM

## 2021-12-10 DIAGNOSIS — R935 Abnormal findings on diagnostic imaging of other abdominal regions, including retroperitoneum: Secondary | ICD-10-CM

## 2021-12-10 DIAGNOSIS — K863 Pseudocyst of pancreas: Secondary | ICD-10-CM

## 2021-12-10 DIAGNOSIS — K8591 Acute pancreatitis with uninfected necrosis, unspecified: Secondary | ICD-10-CM

## 2021-12-10 LAB — SURGICAL PATHOLOGY

## 2021-12-10 MED ORDER — FLUCONAZOLE 200 MG PO TABS: ORAL_TABLET | ORAL | 0 refills | Status: AC

## 2021-12-10 NOTE — Telephone Encounter (Signed)
Patient called.  He had a procedure with Dr. Rush Landmark yesterday and wanted to let you know he was doing fine.  Thank you.

## 2021-12-11 ENCOUNTER — Other Ambulatory Visit: Payer: Self-pay

## 2021-12-11 DIAGNOSIS — Z4689 Encounter for fitting and adjustment of other specified devices: Secondary | ICD-10-CM

## 2021-12-11 DIAGNOSIS — K863 Pseudocyst of pancreas: Secondary | ICD-10-CM

## 2021-12-12 ENCOUNTER — Other Ambulatory Visit (INDEPENDENT_AMBULATORY_CARE_PROVIDER_SITE_OTHER): Payer: PPO

## 2021-12-12 DIAGNOSIS — R935 Abnormal findings on diagnostic imaging of other abdominal regions, including retroperitoneum: Secondary | ICD-10-CM

## 2021-12-12 DIAGNOSIS — K863 Pseudocyst of pancreas: Secondary | ICD-10-CM

## 2021-12-12 DIAGNOSIS — R634 Abnormal weight loss: Secondary | ICD-10-CM

## 2021-12-12 DIAGNOSIS — K8591 Acute pancreatitis with uninfected necrosis, unspecified: Secondary | ICD-10-CM

## 2021-12-12 DIAGNOSIS — R6881 Early satiety: Secondary | ICD-10-CM | POA: Diagnosis not present

## 2021-12-12 LAB — COMPREHENSIVE METABOLIC PANEL
ALT: 175 U/L — ABNORMAL HIGH (ref 0–53)
AST: 60 U/L — ABNORMAL HIGH (ref 0–37)
Albumin: 3.9 g/dL (ref 3.5–5.2)
Alkaline Phosphatase: 346 U/L — ABNORMAL HIGH (ref 39–117)
BUN: 22 mg/dL (ref 6–23)
CO2: 29 mEq/L (ref 19–32)
Calcium: 9.6 mg/dL (ref 8.4–10.5)
Chloride: 99 mEq/L (ref 96–112)
Creatinine, Ser: 1.1 mg/dL (ref 0.40–1.50)
GFR: 66.88 mL/min (ref >60.00)
Glucose, Bld: 115 mg/dL — ABNORMAL HIGH (ref 70–99)
Potassium: 4.1 mEq/L (ref 3.5–5.1)
Sodium: 136 mEq/L (ref 135–145)
Total Bilirubin: 1.5 mg/dL — ABNORMAL HIGH (ref 0.2–1.2)
Total Protein: 7.3 g/dL (ref 6.0–8.3)

## 2021-12-12 LAB — CBC WITH DIFFERENTIAL/PLATELET
Basophils Absolute: 0.1 10*3/uL (ref 0.0–0.1)
Basophils Relative: 1.2 % (ref 0.0–3.0)
Eosinophils Absolute: 0.6 10*3/uL (ref 0.0–0.7)
Eosinophils Relative: 8 % — ABNORMAL HIGH (ref 0.0–5.0)
HCT: 37.6 % — ABNORMAL LOW (ref 39.0–52.0)
Hemoglobin: 12.1 g/dL — ABNORMAL LOW (ref 13.0–17.0)
Lymphocytes Relative: 16 % (ref 12.0–46.0)
Lymphs Abs: 1.3 10*3/uL (ref 0.7–4.0)
MCHC: 32.3 g/dL (ref 30.0–36.0)
MCV: 80.1 fl (ref 78.0–100.0)
Monocytes Absolute: 1 10*3/uL (ref 0.1–1.0)
Monocytes Relative: 12.9 % — ABNORMAL HIGH (ref 3.0–12.0)
Neutro Abs: 5 10*3/uL (ref 1.4–7.7)
Neutrophils Relative %: 61.9 % (ref 43.0–77.0)
Platelets: 224 10*3/uL (ref 150.0–400.0)
RBC: 4.7 Mil/uL (ref 4.22–5.81)
RDW: 16.6 % — ABNORMAL HIGH (ref 11.5–15.5)
WBC: 8 10*3/uL (ref 4.0–10.5)

## 2021-12-12 LAB — AMYLASE: Amylase: 22 U/L — ABNORMAL LOW (ref 27–131)

## 2021-12-12 LAB — LIPASE: Lipase: 5 U/L — ABNORMAL LOW (ref 11.0–59.0)

## 2021-12-17 ENCOUNTER — Telehealth (INDEPENDENT_AMBULATORY_CARE_PROVIDER_SITE_OTHER): Payer: Self-pay | Admitting: *Deleted

## 2021-12-17 ENCOUNTER — Other Ambulatory Visit: Payer: Self-pay

## 2021-12-17 DIAGNOSIS — R748 Abnormal levels of other serum enzymes: Secondary | ICD-10-CM

## 2021-12-17 NOTE — Telephone Encounter (Signed)
Pt asked if he could stop taking baclofen. States he is taking it for reflux and dr Laural Golden prescribed it and he has been taking it for 4 months bid every day. States no issues with reflux while on med but would like to stop or cut down to one at night due to it making him tired and feeling "drunk" during the day.    May leave detailed message on voicemail [phone]

## 2021-12-18 ENCOUNTER — Other Ambulatory Visit (INDEPENDENT_AMBULATORY_CARE_PROVIDER_SITE_OTHER): Payer: Self-pay | Admitting: *Deleted

## 2021-12-18 MED ORDER — PANTOPRAZOLE SODIUM 40 MG PO TBEC: DELAYED_RELEASE_TABLET | ORAL | 3 refills | Status: DC

## 2021-12-18 NOTE — Telephone Encounter (Signed)
Called and discussed with patient to stop baclofen and drop protonix to one daily. Rx sent to pharm with new directions. Pt verbalized understanding of all.

## 2021-12-18 NOTE — Telephone Encounter (Signed)
Per dr Laural Golden - may stop baclofen and drop protonix from bid to once daily and if he needs refill may give #30 with 3 refills.

## 2021-12-20 DIAGNOSIS — I10 Essential (primary) hypertension: Secondary | ICD-10-CM | POA: Diagnosis not present

## 2021-12-20 DIAGNOSIS — E785 Hyperlipidemia, unspecified: Secondary | ICD-10-CM | POA: Diagnosis not present

## 2021-12-24 ENCOUNTER — Ambulatory Visit: Payer: PPO | Admitting: Cardiology

## 2021-12-25 ENCOUNTER — Other Ambulatory Visit (INDEPENDENT_AMBULATORY_CARE_PROVIDER_SITE_OTHER): Payer: PPO

## 2021-12-25 DIAGNOSIS — R748 Abnormal levels of other serum enzymes: Secondary | ICD-10-CM

## 2021-12-25 LAB — HEPATIC FUNCTION PANEL
ALT: 37 U/L (ref 0–53)
AST: 24 U/L (ref 0–37)
Albumin: 3.8 g/dL (ref 3.5–5.2)
Alkaline Phosphatase: 132 U/L — ABNORMAL HIGH (ref 39–117)
Bilirubin, Direct: 0.1 mg/dL (ref 0.0–0.3)
Total Bilirubin: 0.6 mg/dL (ref 0.2–1.2)
Total Protein: 7.1 g/dL (ref 6.0–8.3)

## 2022-01-01 ENCOUNTER — Other Ambulatory Visit: Payer: Self-pay

## 2022-01-01 ENCOUNTER — Ambulatory Visit (HOSPITAL_COMMUNITY)
Admission: RE | Admit: 2022-01-01 | Discharge: 2022-01-01 | Disposition: A | Discharge status: Home / Self Care | Payer: PPO | Source: Ambulatory Visit | Attending: Gastroenterology | Admitting: Gastroenterology

## 2022-01-01 DIAGNOSIS — R6881 Early satiety: Secondary | ICD-10-CM | POA: Insufficient documentation

## 2022-01-01 DIAGNOSIS — K862 Cyst of pancreas: Secondary | ICD-10-CM | POA: Diagnosis not present

## 2022-01-01 DIAGNOSIS — R634 Abnormal weight loss: Secondary | ICD-10-CM | POA: Diagnosis not present

## 2022-01-01 DIAGNOSIS — K863 Pseudocyst of pancreas: Secondary | ICD-10-CM | POA: Diagnosis not present

## 2022-01-01 DIAGNOSIS — K838 Other specified diseases of biliary tract: Secondary | ICD-10-CM | POA: Diagnosis not present

## 2022-01-01 DIAGNOSIS — R935 Abnormal findings on diagnostic imaging of other abdominal regions, including retroperitoneum: Secondary | ICD-10-CM | POA: Insufficient documentation

## 2022-01-01 DIAGNOSIS — K8591 Acute pancreatitis with uninfected necrosis, unspecified: Secondary | ICD-10-CM | POA: Insufficient documentation

## 2022-01-01 DIAGNOSIS — K760 Fatty (change of) liver, not elsewhere classified: Secondary | ICD-10-CM | POA: Diagnosis not present

## 2022-01-01 DIAGNOSIS — K8020 Calculus of gallbladder without cholecystitis without obstruction: Secondary | ICD-10-CM | POA: Diagnosis not present

## 2022-01-01 DIAGNOSIS — K5900 Constipation, unspecified: Secondary | ICD-10-CM | POA: Diagnosis not present

## 2022-01-01 MED ORDER — IOHEXOL 350 MG/ML SOLN
80.0000 mL | Freq: Once | INTRAVENOUS | Status: AC | PRN
  Administered 2022-01-01: 75 mL via INTRAVENOUS

## 2022-01-01 MED ORDER — SODIUM CHLORIDE (PF) 0.9 % IJ SOLN
INTRAMUSCULAR | Status: AC
  Filled 2022-01-01: qty 50

## 2022-01-09 ENCOUNTER — Encounter (HOSPITAL_COMMUNITY): Payer: Self-pay | Admitting: Gastroenterology

## 2022-01-18 NOTE — Anesthesia Preprocedure Evaluation (Addendum)
Anesthesia Evaluation  Patient identified by MRN, date of birth, ID band Patient awake    Reviewed: Allergy & Precautions, NPO status , Patient's Chart, lab work & pertinent test results, reviewed documented beta blocker date and time   Airway Mallampati: I  TM Distance: >3 FB Neck ROM: Full    Dental no notable dental hx. (+) Dental Advisory Given, Teeth Intact   Pulmonary neg pulmonary ROS,    Pulmonary exam normal breath sounds clear to auscultation       Cardiovascular hypertension, Pt. on medications and Pt. on home beta blockers + CAD, + CABG (2014) and +CHF (grade 1 diastolic dysfunction. normal LVEF)  Normal cardiovascular exam+ Valvular Problems/Murmurs (mild MR) MR  Rhythm:Regular Rate:Normal  Echo 2020: 1. Left ventricular ejection fraction, by visual estimation, is 60 to  65%. The left ventricle has normal function. There is no left ventricular  hypertrophy.  2. Left ventricular diastolic parameters are consistent with Grade I  diastolic dysfunction (impaired relaxation).  3. The left ventricle has no regional wall motion abnormalities.  4. Global right ventricle has low normal systolic function.The right  ventricular size is normal. No increase in right ventricular wall  thickness.  5. Left atrial size was normal.  6. Right atrial size was mildly dilated.  7. Mild mitral annular calcification.  8. The mitral valve is degenerative. Mild mitral valve regurgitation.  9. The tricuspid valve is grossly normal. Tricuspid valve regurgitation  is mild.  10. The aortic valve is tricuspid. Aortic valve regurgitation is not  visualized. No evidence of aortic valve sclerosis or stenosis.  11. The pulmonic valve was grossly normal. Pulmonic valve regurgitation is  mild.  12. Aortic dilatation noted.  13. There is mild dilatation of the aortic root.  14. Normal pulmonary artery systolic pressure.  15. The inferior  vena cava is normal in size with greater than 50%  respiratory variability, suggesting right atrial pressure of 3 mmHg.    Neuro/Psych  Neuromuscular disease (peripheral neuropathy) negative psych ROS   GI/Hepatic negative GI ROS, Hx pancreatitis   Endo/Other  negative endocrine ROS  Renal/GU negative Renal ROS  negative genitourinary   Musculoskeletal negative musculoskeletal ROS (+)   Abdominal   Peds  Hematology negative hematology ROS (+)   Anesthesia Other Findings   Reproductive/Obstetrics negative OB ROS                            Anesthesia Physical Anesthesia Plan  ASA: 3  Anesthesia Plan: MAC   Post-op Pain Management:    Induction:   PONV Risk Score and Plan: 2 and Propofol infusion and TIVA  Airway Management Planned: Natural Airway and Simple Face Mask  Additional Equipment: None  Intra-op Plan:   Post-operative Plan:   Informed Consent: I have reviewed the patients History and Physical, chart, labs and discussed the procedure including the risks, benefits and alternatives for the proposed anesthesia with the patient or authorized representative who has indicated his/her understanding and acceptance.     Dental advisory given  Plan Discussed with: CRNA  Anesthesia Plan Comments:        Anesthesia Quick Evaluation

## 2022-01-20 ENCOUNTER — Ambulatory Visit (HOSPITAL_COMMUNITY): Payer: PPO | Admitting: Anesthesiology

## 2022-01-20 ENCOUNTER — Other Ambulatory Visit: Payer: Self-pay

## 2022-01-20 ENCOUNTER — Encounter (HOSPITAL_COMMUNITY): Payer: Self-pay | Admitting: Gastroenterology

## 2022-01-20 ENCOUNTER — Encounter (HOSPITAL_COMMUNITY)
Admission: RE | Disposition: A | Discharge status: Home / Self Care | Payer: Self-pay | Source: Home / Self Care | Attending: Gastroenterology

## 2022-01-20 ENCOUNTER — Telehealth: Payer: Self-pay

## 2022-01-20 ENCOUNTER — Ambulatory Visit (HOSPITAL_COMMUNITY)
Admission: RE | Admit: 2022-01-20 | Discharge: 2022-01-20 | Disposition: A | Discharge status: Home / Self Care | Payer: PPO | Attending: Gastroenterology | Admitting: Gastroenterology

## 2022-01-20 DIAGNOSIS — K319 Disease of stomach and duodenum, unspecified: Secondary | ICD-10-CM | POA: Diagnosis not present

## 2022-01-20 DIAGNOSIS — K222 Esophageal obstruction: Secondary | ICD-10-CM | POA: Insufficient documentation

## 2022-01-20 DIAGNOSIS — I340 Nonrheumatic mitral (valve) insufficiency: Secondary | ICD-10-CM | POA: Diagnosis not present

## 2022-01-20 DIAGNOSIS — K8689 Other specified diseases of pancreas: Secondary | ICD-10-CM | POA: Diagnosis not present

## 2022-01-20 DIAGNOSIS — K259 Gastric ulcer, unspecified as acute or chronic, without hemorrhage or perforation: Secondary | ICD-10-CM | POA: Insufficient documentation

## 2022-01-20 DIAGNOSIS — Z4689 Encounter for fitting and adjustment of other specified devices: Secondary | ICD-10-CM

## 2022-01-20 DIAGNOSIS — K3189 Other diseases of stomach and duodenum: Secondary | ICD-10-CM | POA: Diagnosis not present

## 2022-01-20 DIAGNOSIS — K449 Diaphragmatic hernia without obstruction or gangrene: Secondary | ICD-10-CM | POA: Insufficient documentation

## 2022-01-20 DIAGNOSIS — K863 Pseudocyst of pancreas: Secondary | ICD-10-CM | POA: Diagnosis not present

## 2022-01-20 DIAGNOSIS — Z4659 Encounter for fitting and adjustment of other gastrointestinal appliance and device: Secondary | ICD-10-CM | POA: Insufficient documentation

## 2022-01-20 HISTORY — PX: ESOPHAGOGASTRODUODENOSCOPY (EGD) WITH PROPOFOL: SHX5813

## 2022-01-20 HISTORY — PX: BIOPSY: SHX5522

## 2022-01-20 HISTORY — PX: STENT REMOVAL: SHX6421

## 2022-01-20 SURGERY — ESOPHAGOGASTRODUODENOSCOPY (EGD) WITH PROPOFOL
Anesthesia: Monitor Anesthesia Care

## 2022-01-20 MED ORDER — LACTATED RINGERS IV SOLN: INTRAVENOUS | Status: DC

## 2022-01-20 MED ORDER — EPHEDRINE SULFATE-NACL 50-0.9 MG/10ML-% IV SOSY
PREFILLED_SYRINGE | INTRAVENOUS | Status: DC | PRN
  Administered 2022-01-20: 5 mg via INTRAVENOUS

## 2022-01-20 MED ORDER — PROPOFOL 500 MG/50ML IV EMUL
INTRAVENOUS | Status: DC | PRN
  Administered 2022-01-20: 150 ug/kg/min via INTRAVENOUS

## 2022-01-20 MED ORDER — PROPOFOL 10 MG/ML IV BOLUS
INTRAVENOUS | Status: DC | PRN
  Administered 2022-01-20: 20 mg via INTRAVENOUS
  Administered 2022-01-20: 30 mg via INTRAVENOUS

## 2022-01-20 MED ORDER — LIDOCAINE 2% (20 MG/ML) 5 ML SYRINGE
INTRAMUSCULAR | Status: DC | PRN
  Administered 2022-01-20: 100 mg via INTRAVENOUS

## 2022-01-20 MED ORDER — SODIUM CHLORIDE 0.9 % IV SOLN: INTRAVENOUS | Status: DC

## 2022-01-20 SURGICAL SUPPLY — 15 items

## 2022-01-20 NOTE — H&P (Addendum)
GASTROENTEROLOGY PROCEDURE H&P NOTE   Primary Care Physician: Asencion Noble, MD  HPI: [name] is a 71 y.o. male who presents for EGD for cyst gastrostomy stent pull, less likely though possible necrosectomy.  Past Medical History:  Diagnosis Date   Colon polyps    benign per pt   Coronary artery disease    a. s/p CABG in 2014 with LIMA-LAD and SVG-D1 - performed in [place]   Fracture, ribs    Hyperlipidemia    Hypertension    Pancreatitis    Peripheral neuropathy    Past Surgical History:  Procedure Laterality Date   BALLOON DILATION N/A 12/09/2021   Procedure: BALLOON DILATION;  Surgeon: Mansouraty, Telford Nab., MD;  Location: [facility];  Service: Gastroenterology;  Laterality: N/A;   BILIARY STENT PLACEMENT N/A 12/09/2021   Procedure: BILIARY STENT PLACEMENT;  Surgeon: Rush Landmark Telford Nab., MD;  Location: [facility];  Service: Gastroenterology;  Laterality: N/A;  double pigtail stents x 2   BIOPSY  12/09/2021   Procedure: BIOPSY;  Surgeon: Rush Landmark Telford Nab., MD;  Location: [facility];  Service: Gastroenterology;;   CATARACT EXTRACTION W/PHACO Right 02/15/2021   Procedure: CATARACT EXTRACTION PHACO AND INTRAOCULAR LENS PLACEMENT ([place]);  Surgeon: Baruch Goldmann, MD;  Location: [facility];  Service: Ophthalmology;  Laterality: Right;  CDE 8.65   CATARACT EXTRACTION W/PHACO Left 03/04/2021   Procedure: CATARACT EXTRACTION PHACO AND INTRAOCULAR LENS PLACEMENT ([facility]);  Surgeon: Baruch Goldmann, MD;  Location: [facility];  Service: Ophthalmology;  Laterality: Left;  CDE: 8.15   COLONOSCOPY N/A 03/11/2017   [name]: 2 sessile serrated polyps removed, next colonoscopy in 5 years   CORONARY ARTERY BYPASS GRAFT  2014   DUODENAL STENT PLACEMENT N/A 12/09/2021   Procedure: AXIOS STENT PLACEMENT;  Surgeon: Irving Copas., MD;  Location: [facility];  Service: Gastroenterology;  Laterality: N/A;   ESOPHAGOGASTRODUODENOSCOPY N/A 12/09/2021   Procedure:  ESOPHAGOGASTRODUODENOSCOPY (EGD);  Surgeon: Irving Copas., MD;  Location: [facility];  Service: Gastroenterology;  Laterality: N/A;   HERNIA REPAIR Left 2010   inguinal   POLYPECTOMY  03/11/2017   Procedure: POLYPECTOMY;  Surgeon: Rogene Houston, MD;  [facility]: [facility];  Service: Endoscopy;;  colon   ROTATOR CUFF REPAIR Right 2014   UPPER ESOPHAGEAL ENDOSCOPIC ULTRASOUND (EUS) N/A 12/09/2021   Procedure: UPPER ESOPHAGEAL ENDOSCOPIC ULTRASOUND (EUS);  Surgeon: Irving Copas., MD;  Location: [facility];  Service: Gastroenterology;  Laterality: N/A;   vericous veins  2001   Current Facility-Administered Medications  Medication Dose Route Frequency Provider Last Rate Last Admin   0.9 %  sodium chloride infusion   Intravenous Continuous Mansouraty, Telford Nab., MD        Current Facility-Administered Medications:    0.9 %  sodium chloride infusion, , Intravenous, Continuous, Mansouraty, Telford Nab., MD Allergies  Allergen Reactions   Gabapentin Other (See Comments)    fatigue   Family History  Problem Relation Age of Onset   Stroke Mother    Heart disease Father    Clotting disorder Father    Dementia Sister    Thyroid disease Sister    Hypertension Sister    Colon cancer Neg Hx    Esophageal cancer Neg Hx    Rectal cancer Neg Hx    Inflammatory bowel disease Neg Hx    Liver disease Neg Hx    Pancreatic cancer Neg Hx    Stomach cancer Neg Hx    Social History   Socioeconomic History   Marital status: Divorced  Spouse name: Not on file   Number of children: 2   Years of education: 56   Highest education level: Not on file  Occupational History    Comment: retired  Tobacco Use   Smoking status: Never   Smokeless tobacco: Never  Vaping Use   Vaping Use: Never used  Substance and Sexual Activity   Alcohol use: Not Currently   Drug use: No   Sexual activity: Not on file  Other Topics Concern   Not on file  Social History Narrative    Lives with sig other   caffeine-  A little   Social Determinants of Health   Financial Resource Strain: Not on file  Food Insecurity: Not on file  Transportation Needs: Not on file  Physical Activity: Not on file  Stress: Not on file  Social Connections: Not on file  Intimate Partner Violence: Not on file    Physical Exam: There were no vitals filed for this visit. There is no height or weight on file to calculate BMI. GEN: NAD EYE: Sclerae anicteric ENT: MMM CV: Non-tachycardic GI: Soft, NT/ND NEURO:  Alert & Oriented x 3  Lab Results: No results for input(s): WBC, HGB, HCT, PLT in the last 72 hours. BMET No results for input(s): NA, K, CL, CO2, GLUCOSE, BUN, CREATININE, CALCIUM in the last 72 hours. LFT No results for input(s): PROT, ALBUMIN, AST, ALT, ALKPHOS, BILITOT, BILIDIR, IBILI in the last 72 hours. PT/INR No results for input(s): LABPROT, INR in the last 72 hours.   Impression / Plan: This is a 71 y.o.male who presents for EGD for cyst gastrostomy stent pull, less likely though possible necrosectomy.  The risks and benefits of endoscopic evaluation/treatment were discussed with the patient and/or family; these include but are not limited to the risk of perforation, infection, bleeding, missed lesions, lack of diagnosis, severe illness requiring hospitalization, as well as anesthesia and sedation related illnesses.  The patient's history has been reviewed, patient examined, no change in status, and deemed stable for procedure.  The patient and/or family is agreeable to proceed.    Justice Britain, MD [facility] Office # [phone]

## 2022-01-20 NOTE — Telephone Encounter (Signed)
-----   Message from Irving Copas., MD sent at 01/20/2022 11:08 AM EST ----- Regarding: Follow up NR and RF, Hope you are both well. EGD report in the system. Final necrosectomy with stent removal and everything looks really good. I see no evidence of overt cirrhosis on his endoscopic views.  There has been some concern on the imaging. I think a referral to hepatobiliary surgery to consider cholecystectomy in the setting of finding cholelithiasis is very reasonable now and he may want to pursue that avenue after meeting them. I recommend a repeat CT abdomen to evaluate how the cyst has further developed and also follow-up the area in the head of the pancreas. He wants to follow-up with you NR for now.  He is not decided whether he wants to follow-up in [place] or in [place] after the summer. I told him you probably would see him back in the next few months. If you are okay with ordering the CT abdomen (pancreas protocol) and then forwarding me that if need be, you can or if you want me to have that order that is fine.  But again he would like to follow-up with you first. I am going to go ahead and put in the referrals to hepatobiliary surgery in [place].   [name], Please place a referral to Dr. Alexis Goodell. [name] for consideration of cholecystectomy in setting of prior idiopathic pancreatitis and now findings of cholelithiasis. Will let Dr. Laural Golden and the [facility] follow-up with pancreas imaging for now.   Thanks to all. GM

## 2022-01-20 NOTE — Op Note (Signed)
[facility] Patient Name: [name] Procedure Date: 01/20/2022 MRN: [id number] Attending MD: Justice Britain , MD Date of Birth: March 20, 1951 CSN: [id number] Age: 71 Admit Type: Outpatient Procedure:                Upper GI endoscopy Indications:              Stent removal, Pancreatic necrosis, Pancreatic                            pseudocyst Providers:                Jeanett Schlein, MD, Burtis Junes, RN, Tyna Jaksch Technician Referring MD:             Hildred Laser, MD, Asencion Noble Medicines:                Monitored Anesthesia Care Complications:            No immediate complications. Estimated Blood Loss:     Estimated blood loss was minimal. Procedure:                Pre-Anesthesia Assessment:                           - Prior to the procedure, a History and Physical                            was performed, and patient medications and                            allergies were reviewed. The patient's tolerance of                            previous anesthesia was also reviewed. The risks                            and benefits of the procedure and the sedation                            options and risks were discussed with the patient.                            All questions were answered, and informed consent                            was obtained. Prior Anticoagulants: The patient has                            taken no previous anticoagulant or antiplatelet                            agents except for aspirin. ASA Grade Assessment:                            III -  A patient with severe systemic disease. After                            reviewing the risks and benefits, the patient was                            deemed in satisfactory condition to undergo the                            procedure.                           After obtaining informed consent, the endoscope was                            passed under direct  vision. Throughout the                            procedure, the patient's blood pressure, pulse, and                            oxygen saturations were monitored continuously. The                            [id number] ([id number] Olympus therapeutic endoscope                            was introduced through the mouth, and advanced to                            the second part of duodenum. The upper GI endoscopy                            was accomplished without difficulty. The patient                            tolerated the procedure. Scope In: Scope Out: Findings:      No gross lesions were noted in the entire esophagus.      A widely patent and non-obstructing Schatzki ring was found at the       gastroesophageal junction.      The Z-line was regular and was found 37 cm from the incisors.      A 3 cm hiatal hernia was present.      A previously placed AXIOS cystgastrostomy stent with 2 double pigtail       stents was found on the posterior wall of the gastric body. The three       stents were removed from the cystgastrostomy with combination use of a       snare initially and then a rat-toothed forceps. The cystgastrostomy       tract was then intubated under direct vision into the cyst cavity. The       cyst was significantly smaller than previously noted at time of his       initial cystgastrostomy. However, it was partially filled with fluid and       black  necrotic tissue. Necrosectomy was performed with a rat-toothed       forceps, requiring multiple intubations of the cyst. At the conclusion       of the procedure, very little necrotic tissue and a large amount of       pink, viable tissue was found within the pseudocyst on direct vision.      Patchy mildly erythematous mucosa with gastric erosions (most prevalent       in the antrum) without bleeding was found in the entire examined       stomach. Biopsies were taken with a cold forceps for histology and       Helicobacter  pylori testing.      No gross lesions were noted in the duodenal bulb, in the first portion       of the duodenum and in the second portion of the duodenum. Impression:               - No gross lesions in esophagus. Z-line regular, 37                            cm from the incisors.                           - Widely patent and non-obstructing Schatzki ring.                           - 3 cm hiatal hernia.                           - Previously placed Cystgastrostomy stents were in                            place - these were removed.                           - Entering into the cyst cavity, necrosis was                            noted, though overall improved compared to                            initially. Necrosectomy was performed as noted                            above with clearance of the cavity.                           - Erythematous mucosa in the stomach and gastric                            erosions in the antrum. Biopsied for HP.                           - No gross lesions in the duodenal bulb, in the  first portion of the duodenum and in the second                            portion of the duodenum. Moderate Sedation:      Not Applicable - Patient had care per Anesthesia. Recommendation:           - The patient will be observed post-procedure,                            until all discharge criteria are met.                           - Discharge patient to home.                           - Patient has a contact number available for                            emergencies. The signs and symptoms of potential                            delayed complications were discussed with the                            patient. Return to normal activities tomorrow.                            Written discharge instructions were provided to the                            patient.                           - Low fat diet.                           - Monitor  for signs/symptoms of bleeding,                            perforation, and infection. If issues please call                            our number to get further assistance as needed.                           - Continue present medications.                           - Referral to be placed to hepatobiliary surgery.                            Discussion, in setting of recent pancreatitis in                            2022 and now findings of definitive cholelithiasis,  could this have been etiology for symptomatic                            pancreatitis rather than idiopathic/medication                            induced.                           - Repeat CT-Abdomen Pancreas protocol in 6-weeks to                            evaluate how things look post stent removal and                            ensure no recurrence of large cyst and follow up of                            other cystic region in the HOP.                           - The findings and recommendations were discussed                            with the patient.                           - The findings and recommendations were discussed                            with the patient's family. Procedure Code(s):        --- Professional ---                           [phone], Esophagogastroduodenoscopy, flexible,                            transoral; diagnostic, including collection of                            specimen(s) by brushing or washing, when performed                            (separate procedure)                           [phone], Unlisted procedure, pancreas Diagnosis Code(s):        --- Professional ---                           K44.9, Diaphragmatic hernia without obstruction or                            gangrene                           Z97.8, Presence of other  specified devices                           K31.89, Other diseases of stomach and duodenum                           Z46.59,  Encounter for fitting and adjustment of                            other gastrointestinal appliance and device                           K86.89, Other specified diseases of pancreas                           K86.3, Pseudocyst of pancreas CPT copyright 2019 [employer]. All rights reserved. The codes documented in this report are preliminary and upon coder review may  be revised to meet current compliance requirements. Justice Britain, MD 01/20/2022 10:58:43 AM Number of Addenda: 0

## 2022-01-20 NOTE — Transfer of Care (Signed)
Immediate Anesthesia Transfer of Care Note  Patient: [name]  Procedure(s) Performed: ESOPHAGOGASTRODUODENOSCOPY (EGD) WITH PROPOFOL BIOPSY STENT REMOVAL  Patient Location: PACU  Anesthesia Type:MAC  Level of Consciousness: awake  Airway & Oxygen Therapy: Patient Spontanous Breathing  Post-op Assessment: Report given to RN and Post -op Vital signs reviewed and stable  Post vital signs: Reviewed and stable  Last Vitals:  Vitals Value Taken Time  BP 97/51 01/20/22 1048  Temp    Pulse 66 01/20/22 1050  Resp 14 01/20/22 1050  SpO2 100 % 01/20/22 1050  Vitals shown include unvalidated device data.  Last Pain:  Vitals:   01/20/22 1002  TempSrc: Temporal  PainSc: 0-No pain         Complications: No notable events documented.

## 2022-01-20 NOTE — Anesthesia Postprocedure Evaluation (Signed)
Anesthesia Post Note  Patient: [name]  Procedure(s) Performed: ESOPHAGOGASTRODUODENOSCOPY (EGD) WITH PROPOFOL BIOPSY STENT REMOVAL     Patient location during evaluation: PACU Anesthesia Type: MAC Level of consciousness: awake and alert Pain management: pain level controlled Vital Signs Assessment: post-procedure vital signs reviewed and stable Respiratory status: spontaneous breathing, nonlabored ventilation and respiratory function stable Cardiovascular status: blood pressure returned to baseline and stable Postop Assessment: no apparent nausea or vomiting Anesthetic complications: no   No notable events documented.  Last Vitals:  Vitals:   01/20/22 1100 01/20/22 1110  BP: 109/64 120/78  Pulse: 72 65  Resp: 19 16  Temp:    SpO2: 99% 99%    Last Pain:  Vitals:   01/20/22 1049  TempSrc: Oral  PainSc: 0-No pain                 [name]

## 2022-01-20 NOTE — Discharge Instructions (Signed)
YOU HAD AN ENDOSCOPIC PROCEDURE TODAY: Refer to the procedure report and other information in the discharge instructions given to you for any specific questions about what was found during the examination. If this information does not answer your questions, please call [place] office at [phone] to clarify.  ° °YOU SHOULD EXPECT: Some feelings of bloating in the abdomen. Passage of more gas than usual. Walking can help get rid of the air that was put into your GI tract during the procedure and reduce the bloating. If you had a lower endoscopy (such as a colonoscopy or flexible sigmoidoscopy) you may notice spotting of blood in your stool or on the toilet paper. Some abdominal soreness may be present for a day or two, also. ° °DIET: Your first meal following the procedure should be a light meal and then it is ok to progress to your normal diet. A half-sandwich or bowl of soup is an example of a good first meal. Heavy or fried foods are harder to digest and may make you feel nauseous or bloated. Drink plenty of fluids but you should avoid alcoholic beverages for 24 hours. If you had a esophageal dilation, please see attached instructions for diet.   ° °ACTIVITY: Your care partner should take you home directly after the procedure. You should plan to take it easy, moving slowly for the rest of the day. You can resume normal activity the day after the procedure however YOU SHOULD NOT DRIVE, use power tools, machinery or perform tasks that involve climbing or major physical exertion for 24 hours (because of the sedation medicines used during the test).  ° °SYMPTOMS TO REPORT IMMEDIATELY: °A gastroenterologist can be reached at any hour. Please call [phone]  for any of the following symptoms:  °Following lower endoscopy (colonoscopy, flexible sigmoidoscopy) °Excessive amounts of blood in the stool  °Significant tenderness, worsening of abdominal pains  °Swelling of the abdomen that is new, acute  °Fever of 100° or  higher  °Following upper endoscopy (EGD, EUS, ERCP, esophageal dilation) °Vomiting of blood or coffee ground material  °New, significant abdominal pain  °New, significant chest pain or pain under the shoulder blades  °Painful or persistently difficult swallowing  °New shortness of breath  °Black, tarry-looking or red, bloody stools ° °FOLLOW UP:  °If any biopsies were taken you will be contacted by phone or by letter within the next 1-3 weeks. Call [phone]  if you have not heard about the biopsies in 3 weeks.  °Please also call with any specific questions about appointments or follow up tests. ° °

## 2022-01-20 NOTE — Telephone Encounter (Signed)
I have sent the referral to [facility] with all records faxed.

## 2022-01-21 ENCOUNTER — Encounter: Payer: Self-pay | Admitting: Gastroenterology

## 2022-01-21 ENCOUNTER — Encounter (HOSPITAL_COMMUNITY): Payer: Self-pay | Admitting: Gastroenterology

## 2022-01-21 LAB — SURGICAL PATHOLOGY

## 2022-01-23 ENCOUNTER — Telehealth (INDEPENDENT_AMBULATORY_CARE_PROVIDER_SITE_OTHER): Payer: Self-pay | Admitting: Internal Medicine

## 2022-01-23 NOTE — Telephone Encounter (Signed)
Patient states he is doing well.  He has no abdominal pain. He had cystogastrostomy with stenting December 2019 2022.  He had follow-up study on 01/20/2022 with removal of stent and necrosectomy of residual pancreatic necrosis. Patient states he is eating better.  He has gained few pounds back.  All in all he is lost over 30 pounds. He is not having any heartburn while on PPI.  Change pantoprazole to every other day.  He continues polyethylene glycol on as-needed basis.  Patient has not heard from Dr. Marlowe Aschoff office.  Cholecystectomy. Will arrange for pancreas protocol CT as recommended by Dr. Rush Landmark.

## 2022-02-03 ENCOUNTER — Other Ambulatory Visit (INDEPENDENT_AMBULATORY_CARE_PROVIDER_SITE_OTHER): Payer: Self-pay

## 2022-02-03 DIAGNOSIS — K8502 Idiopathic acute pancreatitis with infected necrosis: Secondary | ICD-10-CM

## 2022-02-07 DIAGNOSIS — K859 Acute pancreatitis without necrosis or infection, unspecified: Secondary | ICD-10-CM | POA: Diagnosis not present

## 2022-02-07 DIAGNOSIS — I10 Essential (primary) hypertension: Secondary | ICD-10-CM | POA: Diagnosis not present

## 2022-02-18 DIAGNOSIS — I2510 Atherosclerotic heart disease of native coronary artery without angina pectoris: Secondary | ICD-10-CM | POA: Diagnosis not present

## 2022-02-18 DIAGNOSIS — E785 Hyperlipidemia, unspecified: Secondary | ICD-10-CM | POA: Diagnosis not present

## 2022-02-18 DIAGNOSIS — K8501 Idiopathic acute pancreatitis with uninfected necrosis: Secondary | ICD-10-CM | POA: Diagnosis not present

## 2022-02-18 DIAGNOSIS — I10 Essential (primary) hypertension: Secondary | ICD-10-CM | POA: Diagnosis not present

## 2022-03-12 ENCOUNTER — Ambulatory Visit (HOSPITAL_COMMUNITY)
Admission: RE | Admit: 2022-03-12 | Discharge: 2022-03-12 | Disposition: A | Discharge status: Home / Self Care | Payer: PPO | Source: Ambulatory Visit | Attending: Internal Medicine | Admitting: Internal Medicine

## 2022-03-12 ENCOUNTER — Other Ambulatory Visit: Payer: Self-pay

## 2022-03-12 DIAGNOSIS — K8020 Calculus of gallbladder without cholecystitis without obstruction: Secondary | ICD-10-CM | POA: Diagnosis not present

## 2022-03-12 DIAGNOSIS — I871 Compression of vein: Secondary | ICD-10-CM | POA: Diagnosis not present

## 2022-03-12 DIAGNOSIS — K838 Other specified diseases of biliary tract: Secondary | ICD-10-CM | POA: Diagnosis not present

## 2022-03-12 DIAGNOSIS — I81 Portal vein thrombosis: Secondary | ICD-10-CM | POA: Diagnosis not present

## 2022-03-12 DIAGNOSIS — K8502 Idiopathic acute pancreatitis with infected necrosis: Secondary | ICD-10-CM | POA: Diagnosis not present

## 2022-03-12 LAB — POCT I-STAT CREATININE: Creatinine, Ser: 1.2 mg/dL (ref 0.61–1.24)

## 2022-03-12 MED ORDER — IOHEXOL 300 MG/ML  SOLN
100.0000 mL | Freq: Once | INTRAMUSCULAR | Status: AC | PRN
  Administered 2022-03-12: 100 mL via INTRAVENOUS

## 2022-03-13 ENCOUNTER — Telehealth: Payer: Self-pay | Admitting: Gastroenterology

## 2022-03-13 NOTE — Telephone Encounter (Signed)
I called and spoke with Dr. Willey Blade. ?I will be back in touch with him later this afternoon after review of some literature. ?GM ?

## 2022-03-13 NOTE — Telephone Encounter (Signed)
Dr. Ria Comment office called stating that Dr. Willey Blade was wanting to speak to Dr. Rush Landmark about patient. Could not advise on what it was about. Please advise.  ?

## 2022-03-13 NOTE — Telephone Encounter (Signed)
RF, ?Thanks for reaching out earlier today.  From the standpoint of potential restart of evolocumab/Repatha in the earlier studies there was some concern of increased risk for pancreatitis.  It does not seem to be as much of a concern at this point in time from the literature that I have been able to review.  The patient had been on this medication for nearly a year before his episode of pancreatitis.  Most often drug-induced pancreatitis episodes are a result of medication initiation within a few weeks or months.  Thus it would be less likely though not impossible that this was a potential source for his pancreatitis episode.  I think the concern based on the chart review as well was Lyrica as a potential other source since it was started within just a few months of the episode. ?If there remains a significant need of hyperlipidemia treatment, then I do think it is not unreasonable to consider restart and close monitoring, but we cannot guarantee that the patient may not have another bout of pancreatitis in the future, if it was/is contributing. ?The patient also has a new gallstone that was noted on imaging and in regards to his follow-up with the surgeons they had deferred on cholecystectomy for now. ?I am going to put Dr. Laural Golden, his primary gastroenterologist, on this as well to see his take on potential restart versus continuing to hold. ?It may end up being a trial and seeing where things go. ? ?Patty, ?I am not sure if Dr. Willey Blade will be able to see this telephone encounter so if you could reach out to his office tomorrow because I tried calling his office this afternoon and kept getting his front desk message that the office was not available.  Thanks. ?GM ? ?

## 2022-03-14 NOTE — Telephone Encounter (Signed)
Tired to call the office of Dr Willey Blade and the office is closed.  Will call on Monday.   ?

## 2022-03-17 ENCOUNTER — Encounter (INDEPENDENT_AMBULATORY_CARE_PROVIDER_SITE_OTHER): Payer: Self-pay | Admitting: *Deleted

## 2022-03-18 NOTE — Telephone Encounter (Signed)
Tried calling at provided number and it is not going through. ?Is there another number? ?GM ?

## 2022-03-18 NOTE — Telephone Encounter (Signed)
Dr Rush Landmark Dr Willey Blade is calling to discuss pt.  Please call  ?

## 2022-03-18 NOTE — Telephone Encounter (Signed)
Able to reach out to Dr. Willey Blade and discussed with him my prior thoughts on potential restart of his medication for hyperlipidemia. ?Patient's last labs were from January in regards to hepatic function panel. ? ?NR, as we had discussed, may be reasonable to pursue the liver Doppler ultrasound and have the patient come in at his convenience to get liver tests to make sure things are still doing okay. ? ?Thanks to all. ?GM ?

## 2022-03-18 NOTE — Telephone Encounter (Signed)
Dr. Ria Comment office called back to follow up with [place]. Please advise ?

## 2022-03-18 NOTE — Telephone Encounter (Signed)
That is the only number I have. I had a hard time myself trying to call that office.  ?

## 2022-03-19 ENCOUNTER — Telehealth: Payer: Self-pay | Admitting: Internal Medicine

## 2022-03-19 DIAGNOSIS — E785 Hyperlipidemia, unspecified: Secondary | ICD-10-CM

## 2022-03-19 NOTE — Telephone Encounter (Signed)
Pt c/o medication issue: ? ?1. Name of Medication:  ?REPATHA SURECLICK 368 MG/ML SOAJ  ? ?2. How are you currently taking this medication (dosage and times per day)? Not currently taking  ? ?3. Are you having a reaction (difficulty breathing--STAT)? No  ? ?4. What is your medication issue? Patient is wanting to start back on this medication. ?

## 2022-03-20 MED ORDER — REPATHA SURECLICK 140 MG/ML ~~LOC~~ SOAJ: 1.0000 | SUBCUTANEOUS | 5 refills | Status: DC

## 2022-03-20 NOTE — Telephone Encounter (Signed)
Spoke with patient of Dr. Debara Pickett (lipids). He was taken off Repatha in August 2022 after pancreatitis. His GI doctor has cleared him to resume (note in Epic).  ? ?Advised will send Rx to pharmacy ?Scheduled f/u on 7/25 @ [place] office ?Lab order mailed ?

## 2022-03-31 ENCOUNTER — Telehealth (INDEPENDENT_AMBULATORY_CARE_PROVIDER_SITE_OTHER): Payer: Self-pay | Admitting: *Deleted

## 2022-03-31 NOTE — Telephone Encounter (Signed)
Patient called to check on appointment to check blood flow for portal vein.  ? ?Dr. Olevia Perches note below from CT results: ?CT results reviewed with patient. ?He has calcified gallstones which are small.   ?Bile duct remains mildly dilated.  Residual peripancreatic stranding and very small retroperitoneal fluid collection. ?Narrowing at confluence of portal vein.  May need Doppler to further evaluate flow in the portal venous system and determine if he should be on an anticoagulant.  We will also get Dr. Donneta Romberg opinion.  Message sent to him. ?  ?

## 2022-04-01 ENCOUNTER — Other Ambulatory Visit (INDEPENDENT_AMBULATORY_CARE_PROVIDER_SITE_OTHER): Payer: Self-pay

## 2022-04-01 DIAGNOSIS — R748 Abnormal levels of other serum enzymes: Secondary | ICD-10-CM

## 2022-04-01 NOTE — Telephone Encounter (Signed)
Per Dr. Laural Golden - patient needs liver doppler. I called and discussed with patient and he states he prefers mondays or tuesdays.  ?

## 2022-04-02 ENCOUNTER — Encounter (INDEPENDENT_AMBULATORY_CARE_PROVIDER_SITE_OTHER): Payer: Self-pay | Admitting: *Deleted

## 2022-04-02 ENCOUNTER — Telehealth (INDEPENDENT_AMBULATORY_CARE_PROVIDER_SITE_OTHER): Payer: Self-pay | Admitting: *Deleted

## 2022-04-02 NOTE — Telephone Encounter (Signed)
Referring MD/PCP: fagan ? ?Procedure: tcs ? ?Reason/Indication:  hx polyps ? ?Has patient had this procedure before?  Yes, 02/2017 ? If so, when, by whom and where?   ? ?Is there a family history of colon cancer?  no ? Who?  What age when diagnosed?   ? ?Is patient diabetic? If yes, Type 1 or Type 2   no ?     ?Does patient have prosthetic heart valve or mechanical valve?  no ? ?Do you have a pacemaker/defibrillator?  no ? ?Has patient ever had endocarditis/atrial fibrillation? no ? ?Does patient use oxygen? no ? ?Has patient had joint replacement within last 12 months?  no ? ?Is patient constipated or do they take laxatives? no ? ?Does patient have a history of alcohol/drug use?  no ? ?Have you had a stroke/heart attack last 6 mths? no ? ?Do you take medicine for weight loss?  no ? ?For male patients,: have you had a hysterectomy  ?                     are you post menopausal  ?                     do you still have your menstrual cycle  ? ?Is patient on blood thinner such as Coumadin, Plavix and/or Aspirin? yes ? ?Medications: asa 81 mg daily, metoprolol 25 mg 1/2 tab daily, irbesartan 75 mg 1/2 tab daily, pantoprazole daily ? ?Allergies: nkda ? ?Medication Adjustment per Dr [name]/Dr Jenetta Downer asa 2 days ? ?Procedure date & time: 04/24/22 ? ? ?

## 2022-04-02 NOTE — Telephone Encounter (Signed)
Error   This encounter was created in error - please disregard. 

## 2022-04-03 ENCOUNTER — Other Ambulatory Visit (INDEPENDENT_AMBULATORY_CARE_PROVIDER_SITE_OTHER): Payer: Self-pay

## 2022-04-03 DIAGNOSIS — Z86010 Personal history of colonic polyps: Secondary | ICD-10-CM

## 2022-04-08 ENCOUNTER — Ambulatory Visit (HOSPITAL_COMMUNITY)
Admission: RE | Admit: 2022-04-08 | Discharge: 2022-04-08 | Disposition: A | Discharge status: Home / Self Care | Payer: PPO | Source: Ambulatory Visit | Attending: Internal Medicine | Admitting: Internal Medicine

## 2022-04-08 DIAGNOSIS — R748 Abnormal levels of other serum enzymes: Secondary | ICD-10-CM | POA: Diagnosis not present

## 2022-04-08 DIAGNOSIS — K862 Cyst of pancreas: Secondary | ICD-10-CM | POA: Diagnosis not present

## 2022-04-08 DIAGNOSIS — D734 Cyst of spleen: Secondary | ICD-10-CM | POA: Diagnosis not present

## 2022-04-08 DIAGNOSIS — R161 Splenomegaly, not elsewhere classified: Secondary | ICD-10-CM | POA: Diagnosis not present

## 2022-04-24 ENCOUNTER — Encounter (HOSPITAL_COMMUNITY): Payer: Self-pay | Admitting: Internal Medicine

## 2022-04-24 ENCOUNTER — Ambulatory Visit (HOSPITAL_BASED_OUTPATIENT_CLINIC_OR_DEPARTMENT_OTHER): Payer: PPO | Admitting: Anesthesiology

## 2022-04-24 ENCOUNTER — Other Ambulatory Visit: Payer: Self-pay

## 2022-04-24 ENCOUNTER — Encounter (HOSPITAL_COMMUNITY)
Admission: RE | Disposition: A | Discharge status: Home / Self Care | Payer: Self-pay | Source: Home / Self Care | Attending: Internal Medicine

## 2022-04-24 ENCOUNTER — Ambulatory Visit (HOSPITAL_COMMUNITY)
Admission: RE | Admit: 2022-04-24 | Discharge: 2022-04-24 | Disposition: A | Discharge status: Home / Self Care | Payer: PPO | Attending: Internal Medicine | Admitting: Internal Medicine

## 2022-04-24 ENCOUNTER — Ambulatory Visit (HOSPITAL_COMMUNITY): Payer: PPO | Admitting: Anesthesiology

## 2022-04-24 DIAGNOSIS — K5730 Diverticulosis of large intestine without perforation or abscess without bleeding: Secondary | ICD-10-CM | POA: Insufficient documentation

## 2022-04-24 DIAGNOSIS — Z86010 Personal history of colonic polyps: Secondary | ICD-10-CM

## 2022-04-24 DIAGNOSIS — Z951 Presence of aortocoronary bypass graft: Secondary | ICD-10-CM | POA: Diagnosis not present

## 2022-04-24 DIAGNOSIS — G709 Myoneural disorder, unspecified: Secondary | ICD-10-CM | POA: Diagnosis not present

## 2022-04-24 DIAGNOSIS — Z1211 Encounter for screening for malignant neoplasm of colon: Secondary | ICD-10-CM | POA: Diagnosis not present

## 2022-04-24 DIAGNOSIS — K635 Polyp of colon: Secondary | ICD-10-CM | POA: Insufficient documentation

## 2022-04-24 DIAGNOSIS — K644 Residual hemorrhoidal skin tags: Secondary | ICD-10-CM

## 2022-04-24 DIAGNOSIS — K648 Other hemorrhoids: Secondary | ICD-10-CM

## 2022-04-24 DIAGNOSIS — I2510 Atherosclerotic heart disease of native coronary artery without angina pectoris: Secondary | ICD-10-CM | POA: Diagnosis not present

## 2022-04-24 DIAGNOSIS — I10 Essential (primary) hypertension: Secondary | ICD-10-CM | POA: Diagnosis not present

## 2022-04-24 DIAGNOSIS — Z09 Encounter for follow-up examination after completed treatment for conditions other than malignant neoplasm: Secondary | ICD-10-CM

## 2022-04-24 HISTORY — PX: COLONOSCOPY WITH PROPOFOL: SHX5780

## 2022-04-24 HISTORY — PX: POLYPECTOMY: SHX5525

## 2022-04-24 LAB — HM COLONOSCOPY

## 2022-04-24 SURGERY — COLONOSCOPY WITH PROPOFOL
Anesthesia: General

## 2022-04-24 MED ORDER — PROPOFOL 500 MG/50ML IV EMUL
INTRAVENOUS | Status: DC | PRN
  Administered 2022-04-24: 125 ug/kg/min via INTRAVENOUS

## 2022-04-24 MED ORDER — PROPOFOL 10 MG/ML IV BOLUS
INTRAVENOUS | Status: DC | PRN
  Administered 2022-04-24: 100 mg via INTRAVENOUS

## 2022-04-24 MED ORDER — LACTATED RINGERS IV SOLN
INTRAVENOUS | Status: DC | PRN
Start: 2022-04-24 — End: 2022-04-24

## 2022-04-24 MED ORDER — LIDOCAINE HCL (CARDIAC) PF 100 MG/5ML IV SOSY
PREFILLED_SYRINGE | INTRAVENOUS | Status: DC | PRN
  Administered 2022-04-24: 50 mg via INTRAVENOUS

## 2022-04-24 NOTE — Transfer of Care (Signed)
Immediate Anesthesia Transfer of Care Note ? ?Patient: [name] ? ?Procedure(s) Performed: COLONOSCOPY WITH PROPOFOL ?POLYPECTOMY ? ?Patient Location: Endoscopy Unit ? ?Anesthesia Type:General ? ?Level of Consciousness: awake and alert  ? ?Airway & Oxygen Therapy: Patient Spontanous Breathing ? ?Post-op Assessment: Report given to RN and Post -op Vital signs reviewed and stable ? ?Post vital signs: Reviewed and stable ? ?Last Vitals:  ?Vitals Value Taken Time  ?BP    ?Temp    ?Pulse 71   ?Resp    ?SpO2    ? ? ?Last Pain:  ?Vitals:  ? 04/24/22 1231  ?TempSrc:   ?PainSc: 0-No pain  ?   ? ?Patients Stated Pain Goal: 6 (04/24/22 1159) ? ?Complications: No notable events documented. ?

## 2022-04-24 NOTE — Anesthesia Preprocedure Evaluation (Signed)
Anesthesia Evaluation  ?Patient identified by MRN, date of birth, ID band ?Patient awake ? ? ? ?Reviewed: ?Allergy & Precautions, H&P , NPO status , Patient's Chart, lab work & pertinent test results, reviewed documented beta blocker date and time  ? ?Airway ?Mallampati: II ? ?TM Distance: >3 FB ?Neck ROM: full ? ? ? Dental ?no notable dental hx. ? ?  ?Pulmonary ?neg pulmonary ROS,  ?  ?Pulmonary exam normal ?breath sounds clear to auscultation ? ? ? ? ? ? Cardiovascular ?Exercise Tolerance: Good ?hypertension, + CAD and + CABG  ? ?Rhythm:regular Rate:Normal ? ? ?  ?Neuro/Psych ? Neuromuscular disease negative psych ROS  ? GI/Hepatic ?negative GI ROS, Neg liver ROS,   ?Endo/Other  ?negative endocrine ROS ? Renal/GU ?negative Renal ROS  ?negative genitourinary ?  ?Musculoskeletal ? ? Abdominal ?  ?Peds ? Hematology ? ?(+) Blood dyscrasia, anemia ,   ?Anesthesia Other Findings ? ? Reproductive/Obstetrics ?negative OB ROS ? ?  ? ? ? ? ? ? ? ? ? ? ? ? ? ?  ?  ? ? ? ? ? ? ? ? ?Anesthesia Physical ?Anesthesia Plan ? ?ASA: 3 ? ?Anesthesia Plan: General  ? ?Post-op Pain Management:   ? ?Induction:  ? ?PONV Risk Score and Plan: Propofol infusion ? ?Airway Management Planned:  ? ?Additional Equipment:  ? ?Intra-op Plan:  ? ?Post-operative Plan:  ? ?Informed Consent: I have reviewed the patients History and Physical, chart, labs and discussed the procedure including the risks, benefits and alternatives for the proposed anesthesia with the patient or authorized representative who has indicated his/her understanding and acceptance.  ? ? ? ?Dental Advisory Given ? ?Plan Discussed with: CRNA ? ?Anesthesia Plan Comments:   ? ? ? ? ? ? ?Anesthesia Quick Evaluation ? ?

## 2022-04-24 NOTE — Discharge Instructions (Signed)
No aspirin or NSAIDs for 24 hours ?Resume scheduled medications as before ?High-fiber diet ?No driving for 24 hours. ?Physician will call with biopsy results. ?

## 2022-04-24 NOTE — Anesthesia Postprocedure Evaluation (Signed)
Anesthesia Post Note ? ?Patient: [name] ? ?Procedure(s) Performed: COLONOSCOPY WITH PROPOFOL ?POLYPECTOMY ? ?Patient location during evaluation: Phase II ?Anesthesia Type: General ?Level of consciousness: awake ?Pain management: pain level controlled ?Vital Signs Assessment: post-procedure vital signs reviewed and stable ?Respiratory status: spontaneous breathing and respiratory function stable ?Cardiovascular status: blood pressure returned to baseline and stable ?Postop Assessment: no headache and no apparent nausea or vomiting ?Anesthetic complications: no ?Comments: Late entry ? ? ?No notable events documented. ? ? ?Last Vitals:  ?Vitals:  ? 04/24/22 1258 04/24/22 1302  ?BP: (!) 74/65 (!) 97/58  ?Resp: 18   ?Temp: (!) 36.4 ?C   ?SpO2: 99%   ?  ?Last Pain:  ?Vitals:  ? 04/24/22 1302  ?TempSrc:   ?PainSc: 0-No pain  ? ? ?  ?  ?  ?  ?  ?  ? ?Louann Sjogren ? ? ? ? ?

## 2022-04-24 NOTE — H&P (Signed)
[name] is an 71 y.o. male.   ?Chief Complaint: Patient is here for colonoscopy. ?HPI: Patient is 71 year old Caucasian male who has a history of colonic polyps and is here for surveillance colonoscopy.  His last colonoscopy was in March 2018 with removal of 3 polyps and these were sessile serrated polyps.  He has had polyps removed on his colonoscopy back in 2010 when he was living in [place].  He denies abdominal pain change in bowel habits or rectal bleeding. ?Last aspirin dose was 4 days ago. ? ?Past Medical History:  ?Diagnosis Date  ? Colon polyps   ? benign per pt  ? Coronary artery disease   ? a. s/p CABG in 2014 with LIMA-LAD and SVG-D1 - performed in [place]  ? Fracture, ribs   ? Hyperlipidemia   ? Hypertension   ? Pancreatitis   ? Peripheral neuropathy   ? ? ?Past Surgical History:  ?Procedure Laterality Date  ? BALLOON DILATION N/A 12/09/2021  ? Procedure: BALLOON DILATION;  Surgeon: Irving Copas., MD;  Location: [facility];  Service: Gastroenterology;  Laterality: N/A;  ? BILIARY STENT PLACEMENT N/A 12/09/2021  ? Procedure: BILIARY STENT PLACEMENT;  Surgeon: Rush Landmark Telford Nab., MD;  Location: [facility];  Service: Gastroenterology;  Laterality: N/A;  double pigtail stents x 2  ? BIOPSY  12/09/2021  ? Procedure: BIOPSY;  Surgeon: Irving Copas., MD;  Location: [facility];  Service: Gastroenterology;;  ? BIOPSY  01/20/2022  ? Procedure: BIOPSY;  Surgeon: Irving Copas., MD;  Location: [facility];  Service: Gastroenterology;;  ? CATARACT EXTRACTION W/PHACO Right 02/15/2021  ? Procedure: CATARACT EXTRACTION PHACO AND INTRAOCULAR LENS PLACEMENT (IOC);  Surgeon: Baruch Goldmann, MD;  Location: [facility];  Service: Ophthalmology;  Laterality: Right;  CDE 8.65  ? CATARACT EXTRACTION W/PHACO Left 03/04/2021  ? Procedure: CATARACT EXTRACTION PHACO AND INTRAOCULAR LENS PLACEMENT (IOC);  Surgeon: Baruch Goldmann, MD;  Location: [facility];  Service: Ophthalmology;   Laterality: Left;  CDE: 8.15  ? COLONOSCOPY N/A 03/11/2017  ? Nira Visscher: 2 sessile serrated polyps removed, next colonoscopy in 5 years  ? CORONARY ARTERY BYPASS GRAFT  2014  ? DUODENAL STENT PLACEMENT N/A 12/09/2021  ? Procedure: AXIOS STENT PLACEMENT;  Surgeon: Irving Copas., MD;  Location: [facility];  Service: Gastroenterology;  Laterality: N/A;  ? ESOPHAGOGASTRODUODENOSCOPY N/A 12/09/2021  ? Procedure: ESOPHAGOGASTRODUODENOSCOPY (EGD);  Surgeon: Irving Copas., MD;  Location: [facility];  Service: Gastroenterology;  Laterality: N/A;  ? ESOPHAGOGASTRODUODENOSCOPY (EGD) WITH PROPOFOL N/A 01/20/2022  ? Procedure: ESOPHAGOGASTRODUODENOSCOPY (EGD) WITH PROPOFOL;  Surgeon: Rush Landmark Telford Nab., MD;  Location: [facility];  Service: Gastroenterology;  Laterality: N/A;  ? HERNIA REPAIR Left 2010  ? inguinal  ? POLYPECTOMY  03/11/2017  ? Procedure: POLYPECTOMY;  Surgeon: Rogene Houston, MD;  Location: [facility];  Service: Endoscopy;;  colon  ? ROTATOR CUFF REPAIR Right 2014  ? STENT REMOVAL  01/20/2022  ? Procedure: STENT REMOVAL;  Surgeon: Irving Copas., MD;  Location: [facility];  Service: Gastroenterology;;  ? UPPER ESOPHAGEAL ENDOSCOPIC ULTRASOUND (EUS) N/A 12/09/2021  ? Procedure: UPPER ESOPHAGEAL ENDOSCOPIC ULTRASOUND (EUS);  Surgeon: Irving Copas., MD;  Location: [facility];  Service: Gastroenterology;  Laterality: N/A;  ? vericous veins  2001  ? ? ?Family History  ?Problem Relation Age of Onset  ? Stroke Mother   ? Heart disease Father   ? Clotting disorder Father   ? Dementia Sister   ? Thyroid disease Sister   ? Hypertension  Sister   ? Colon cancer Neg Hx   ? Esophageal cancer Neg Hx   ? Rectal cancer Neg Hx   ? Inflammatory bowel disease Neg Hx   ? Liver disease Neg Hx   ? Pancreatic cancer Neg Hx   ? Stomach cancer Neg Hx   ? ?Social History:  reports that he has never smoked. He has never used smokeless tobacco. He reports that he does not currently use  alcohol. He reports that he does not use drugs. ? ?Allergies: No Known Allergies ? ?Medications Prior to Admission  ?Medication Sig Dispense Refill  ? acetaminophen (TYLENOL) 500 MG tablet Take 1,000 mg by mouth every 6 (six) hours as needed for moderate pain or headache.    ? aspirin 81 MG tablet Take 1 tablet (81 mg total) by mouth daily. 30 tablet   ? Evolocumab (REPATHA SURECLICK) 262 MG/ML SOAJ Inject 1 Dose into the skin every 14 (fourteen) days. 2 mL 5  ? irbesartan (AVAPRO) 75 MG tablet Take 37.5 mg by mouth daily.    ? metoprolol tartrate (LOPRESSOR) 25 MG tablet Take 12.5 mg by mouth every morning.    ? Multiple Vitamins-Minerals (MULTIVITAMIN WITH MINERALS) tablet Take 1 tablet by mouth daily.    ? pantoprazole (PROTONIX) 40 MG tablet Take one tablet daily 30 tablet 3  ? baclofen 5 MG TABS Take 5 mg by mouth 2 (two) times daily as needed for muscle spasms. (Patient not taking: Reported on 01/13/2022) 30 each 0  ? polyethylene glycol (MIRALAX / GLYCOLAX) 17 g packet Take 17 g by mouth daily. (Patient not taking: Reported on 04/21/2022) 14 each 0  ? ? ?No results found for this or any previous visit (from the past 48 hour(s)). ?No results found. ? ?Review of Systems ? ?Blood pressure 131/77, temperature 97.7 ?F (36.5 ?C), temperature source Oral, resp. rate 18, height '5\' 8"'$  (1.727 m), weight 71.7 kg, SpO2 98 %. ?Physical Exam ?HENT:  ?   Mouth/Throat:  ?   Mouth: Mucous membranes are moist.  ?   Pharynx: Oropharynx is clear.  ?Eyes:  ?   General: No scleral icterus. ?   Conjunctiva/sclera: Conjunctivae normal.  ?Cardiovascular:  ?   Rate and Rhythm: Normal rate and regular rhythm.  ?   Heart sounds: Normal heart sounds. No murmur heard. ?Pulmonary:  ?   Effort: Pulmonary effort is normal.  ?   Breath sounds: Normal breath sounds.  ?Abdominal:  ?   General: There is no distension.  ?   Palpations: Abdomen is soft. There is no mass.  ?   Tenderness: There is no abdominal tenderness.  ?Musculoskeletal:     ?    General: No swelling.  ?   Cervical back: Neck supple.  ?Lymphadenopathy:  ?   Cervical: No cervical adenopathy.  ?Skin: ?   General: Skin is warm and dry.  ?Neurological:  ?   Mental Status: He is alert.  ?  ? ?Assessment/Plan ? ?History of colonic polyps(sessile serrated polyps) ?Surveillance colonoscopy ? ?Hildred Laser, MD ?04/24/2022, 12:23 PM ? ? ? ?

## 2022-04-24 NOTE — Op Note (Signed)
[facility] ?Patient Name: [name] ?Procedure Date: 04/24/2022 12:14 PM ?MRN: [id number] ?Date of Birth: 09/08/1951 ?Attending MD: Hildred Laser , MD ?CSN: [id number] ?Age: 71 ?Admit Type: Outpatient ?Procedure:                Colonoscopy ?Indications:              High risk colon cancer surveillance: Personal  ?                          history of colonic polyps ?Providers:                Hildred Laser, MD, Caprice Kluver, Wynonia Musty  ?                          Tech, Technician ?Referring MD:             Asencion Noble, MD ?Medicines:                Propofol per Anesthesia ?Complications:            No immediate complications. ?Estimated Blood Loss:     Estimated blood loss was minimal. ?Procedure:                Pre-Anesthesia Assessment: ?                          - Prior to the procedure, a History and Physical  ?                          was performed, and patient medications and  ?                          allergies were reviewed. The patient's tolerance of  ?                          previous anesthesia was also reviewed. The risks  ?                          and benefits of the procedure and the sedation  ?                          options and risks were discussed with the patient.  ?                          All questions were answered, and informed consent  ?                          was obtained. Prior Anticoagulants: The patient has  ?                          taken no previous anticoagulant or antiplatelet  ?                          agents except for aspirin. ASA Grade Assessment: II  ?                          -  A patient with mild systemic disease. After  ?                          reviewing the risks and benefits, the patient was  ?                          deemed in satisfactory condition to undergo the  ?                          procedure. ?                          After obtaining informed consent, the colonoscope  ?                          was passed under direct vision. Throughout the  ?                           procedure, the patient's blood pressure, pulse, and  ?                          oxygen saturations were monitored continuously. The  ?                          PCF-HQ190L ([id number]) scope was introduced through  ?                          the anus and advanced to the the cecum, identified  ?                          by appendiceal orifice and ileocecal valve. The  ?                          colonoscopy was performed without difficulty. The  ?                          patient tolerated the procedure well. The quality  ?                          of the bowel preparation was good. The ileocecal  ?                          valve, appendiceal orifice, and rectum were  ?                          photographed. ?Scope In: 12:34:48 PM ?Scope Out: 12:55:43 PM ?Scope Withdrawal Time: 0 hours 16 minutes 42 seconds  ?Total Procedure Duration: 0 hours 20 minutes 55 seconds  ?Findings: ?     The perianal and digital rectal examinations were normal. ?     A few diverticula were found in the sigmoid colon. ?     A small polyp was found in the proximal sigmoid colon. Biopsies were  ?     taken with a cold forceps for histology. ?     External and internal hemorrhoids were  found during retroflexion. The  ?     hemorrhoids were medium-sized. ?Impression:               - Diverticulosis in the sigmoid colon. ?                          - One small polyp in the proximal sigmoid colon.  ?                          Biopsied. ?                          - External and internal hemorrhoids. ?Moderate Sedation: ?     Per Anesthesia Care ?Recommendation:           - Patient has a contact number available for  ?                          emergencies. The signs and symptoms of potential  ?                          delayed complications were discussed with the  ?                          patient. Return to normal activities tomorrow.  ?                          Written discharge instructions were provided to the  ?                           patient. ?                          - High fiber diet today. ?                          - Continue present medications. ?                          - No aspirin, ibuprofen, naproxen, or other  ?                          non-steroidal anti-inflammatory drugs for 1 day. ?                          - Await pathology results. ?                          - Repeat colonoscopy in 5 years for surveillance. ?Procedure Code(s):        --- Professional --- ?                          [phone], Colonoscopy, flexible; with biopsy, single  ?                          or multiple ?Diagnosis Code(s):        --- Professional --- ?  Z86.010, Personal history of colonic polyps ?                          K64.8, Other hemorrhoids ?                          K63.5, Polyp of colon ?                          K57.30, Diverticulosis of large intestine without  ?                          perforation or abscess without bleeding ?CPT copyright 2019 American Medical Association. All rights reserved. ?The codes documented in this report are preliminary and upon coder review may  ?be revised to meet current compliance requirements. ?Hildred Laser, MD ?Hildred Laser, MD ?04/24/2022 1:03:00 PM ?This report has been signed electronically. ?Number of Addenda: 0 ?

## 2022-04-25 ENCOUNTER — Encounter (INDEPENDENT_AMBULATORY_CARE_PROVIDER_SITE_OTHER): Payer: Self-pay | Admitting: *Deleted

## 2022-04-29 LAB — SURGICAL PATHOLOGY

## 2022-05-01 ENCOUNTER — Encounter (HOSPITAL_COMMUNITY): Payer: Self-pay | Admitting: Internal Medicine

## 2022-05-06 DIAGNOSIS — H6123 Impacted cerumen, bilateral: Secondary | ICD-10-CM | POA: Diagnosis not present

## 2022-05-09 DIAGNOSIS — I2510 Atherosclerotic heart disease of native coronary artery without angina pectoris: Secondary | ICD-10-CM | POA: Diagnosis not present

## 2022-05-09 DIAGNOSIS — K859 Acute pancreatitis without necrosis or infection, unspecified: Secondary | ICD-10-CM | POA: Diagnosis not present

## 2022-05-26 ENCOUNTER — Ambulatory Visit: Payer: PPO | Admitting: Family Medicine

## 2022-06-30 ENCOUNTER — Encounter: Payer: Self-pay | Admitting: Orthopedic Surgery

## 2022-06-30 ENCOUNTER — Ambulatory Visit (INDEPENDENT_AMBULATORY_CARE_PROVIDER_SITE_OTHER): Payer: PPO | Admitting: Orthopedic Surgery

## 2022-06-30 VITALS — BP 109/59 | HR 58 | Ht 68.0 in | Wt 169.5 lb

## 2022-06-30 DIAGNOSIS — S46212A Strain of muscle, fascia and tendon of other parts of biceps, left arm, initial encounter: Secondary | ICD-10-CM | POA: Diagnosis not present

## 2022-06-30 NOTE — Progress Notes (Signed)
Chief Complaint  Patient presents with   Arm Pain    L/Bicep its been hurting for about 3 or 4 months , but last week or so it has really been hurting bad and I have been using ice on it.    HPI: 32 male s/p right  rcr c/o several month h/o left shoulder pain then played some golf and felt a pop in the proximal aspect of his left shoulder.  Note he has received an injection of cortisone in his left shoulder for pain about 2 weeks prior  Now complains of pain in the proximal aspect of the left biceps  Past Medical History:  Diagnosis Date   Colon polyps    benign per pt   Coronary artery disease    a. s/p CABG in 2014 with LIMA-LAD and SVG-D1 - performed in [place]   Fracture, ribs    Hyperlipidemia    Hypertension    Pancreatitis    Peripheral neuropathy     BP (!) 109/59   Pulse (!) 58   Ht '5\' 8"'$  (1.727 m)   Wt 169 lb 8 oz (76.9 kg)   BMI 25.77 kg/m    General appearance: Well-developed well-nourished no gross deformities  Cardiovascular normal pulse and perfusion normal color without edema  Neurologically no sensation loss or deficits or pathologic reflexes  Psychological: Awake alert and oriented x3 mood and affect normal  Skin no lacerations or ulcerations no nodularity no palpable masses, no erythema or nodularity  Musculoskeletal: Distal biceps tendon intact positive hook test for intact biceps tendon small bruise near the upper portion of the biceps with soreness and tenderness range of motion of the shoulder normal  Imaging none  A/P  Proximal biceps tendon rupture  Symptomatic treatment progressive return to normal activity

## 2022-06-30 NOTE — Patient Instructions (Addendum)
You have torn your bicep tendon in your LEFT arm.   Take Tylenol for pain as needed. Use a heating pad if you needed. Rest  Your arm will be sore and tender for the next 4 weeks at least.   Don't do any activity that causes pain.

## 2022-07-15 ENCOUNTER — Ambulatory Visit (INDEPENDENT_AMBULATORY_CARE_PROVIDER_SITE_OTHER): Payer: PPO | Admitting: Internal Medicine

## 2022-07-15 ENCOUNTER — Encounter: Payer: Self-pay | Admitting: Internal Medicine

## 2022-07-15 VITALS — BP 118/70 | HR 59 | Resp 20 | Ht 68.0 in | Wt 170.0 lb

## 2022-07-15 DIAGNOSIS — I25119 Atherosclerotic heart disease of native coronary artery with unspecified angina pectoris: Secondary | ICD-10-CM | POA: Diagnosis not present

## 2022-07-15 DIAGNOSIS — T466X5D Adverse effect of antihyperlipidemic and antiarteriosclerotic drugs, subsequent encounter: Secondary | ICD-10-CM | POA: Diagnosis not present

## 2022-07-15 DIAGNOSIS — M791 Myalgia, unspecified site: Secondary | ICD-10-CM

## 2022-07-15 DIAGNOSIS — T466X5A Adverse effect of antihyperlipidemic and antiarteriosclerotic drugs, initial encounter: Secondary | ICD-10-CM

## 2022-07-15 DIAGNOSIS — E785 Hyperlipidemia, unspecified: Secondary | ICD-10-CM

## 2022-07-15 NOTE — Patient Instructions (Addendum)
Medication Instructions:  Your physician recommends that you continue on your current medications as directed. Please refer to the Current Medication list given to you today.   Labwork: Fasting:  -NMR -LP(a)  Testing/Procedures: None  Follow-Up: Follow up with Dr. Debara Pickett in 1 year Follow up with Dr. Domenic Polite- Routine in [place]  Any Other Special Instructions Will Be Listed Below (If Applicable).     If you need a refill on your cardiac medications before your next appointment, please call your pharmacy.

## 2022-07-15 NOTE — Progress Notes (Signed)
LIPID CLINIC CONSULT NOTE  Chief Complaint:  Follow-up dyslipidemia  Primary Care Physician: Asencion Noble, MD  Primary Cardiologist:  Rozann Lesches, MD  HPI:  [name] is a 71 y.o. male who is being seen today for the evaluation of dyslipidemia at the request of Dr. Bronson Ing.  This is a pleasant 71 year old male with a history of coronary artery disease, hypertension and dyslipidemia.  He is followed by my partner Dr. Bronson Ing in [place] and was referred for evaluation and management of dyslipidemia.  His most recent labs drawn by his PCP in August 2020 showed total cholesterol 171, HDL 33, LDL 109 and triglycerides 143.  This target LDL is less than 70.  According to his cardiologist he had two-vessel bypass surgery in 2014.  He is also been intolerant of low-dose lovastatin and rosuvastatin.  He was also on ezetimibe but he discontinued that.  All of this is related to intolerances which included myalgias.  He does report a variable diet but is not significantly atherogenic.  He does remain physically active.  01/17/2020  [name] is seen today in follow-up.  Overall he is done well with Repatha and has tolerated it without any incidence.  He denies any myalgias.  Cholesterol is significantly improved, in fact his total is now 101, triglycerides 137, HDL 33 and LDL 44.  07/15/2022  Mr. Brothers returns today for follow-up.  Unfortunately, he had pancreatitis recently.  The etiology was unclear however it was noted to have a large pancreatic pseudocyst.  He was ultimately found to have several small gallstones.  It was advised to stop Repatha because of possible relation, however he had restarted it per their recommendations back about 3 or 4 months ago.  He has not had repeat labs since then.  Overall he seems to be doing much better and is tolerating it well.  PMHx:  Past Medical History:  Diagnosis Date   Colon polyps    benign per pt   Coronary artery disease    a. s/p  CABG in 2014 with LIMA-LAD and SVG-D1 - performed in [place]   Fracture, ribs    Hyperlipidemia    Hypertension    Pancreatitis    Peripheral neuropathy     Past Surgical History:  Procedure Laterality Date   BALLOON DILATION N/A 12/09/2021   Procedure: BALLOON DILATION;  Surgeon: Mansouraty, Telford Nab., MD;  Location: [facility];  Service: Gastroenterology;  Laterality: N/A;   BILIARY STENT PLACEMENT N/A 12/09/2021   Procedure: BILIARY STENT PLACEMENT;  Surgeon: Rush Landmark Telford Nab., MD;  Location: [facility];  Service: Gastroenterology;  Laterality: N/A;  double pigtail stents x 2   BIOPSY  12/09/2021   Procedure: BIOPSY;  Surgeon: Rush Landmark Telford Nab., MD;  Location: [facility];  Service: Gastroenterology;;   BIOPSY  01/20/2022   Procedure: BIOPSY;  Surgeon: Irving Copas., MD;  Location: [facility];  Service: Gastroenterology;;   CATARACT EXTRACTION W/PHACO Right 02/15/2021   Procedure: CATARACT EXTRACTION PHACO AND INTRAOCULAR LENS PLACEMENT ([place]);  Surgeon: Baruch Goldmann, MD;  Location: [facility];  Service: Ophthalmology;  Laterality: Right;  CDE 8.65   CATARACT EXTRACTION W/PHACO Left 03/04/2021   Procedure: CATARACT EXTRACTION PHACO AND INTRAOCULAR LENS PLACEMENT (IOC);  Surgeon: Baruch Goldmann, MD;  Location: [facility];  Service: Ophthalmology;  Laterality: Left;  CDE: 8.15   COLONOSCOPY N/A 03/11/2017   [name]: 2 sessile serrated polyps removed, next colonoscopy in 5 years   COLONOSCOPY WITH PROPOFOL N/A 04/24/2022   Procedure: COLONOSCOPY  WITH PROPOFOL;  Surgeon: Rogene Houston, MD;  [facility]: [facility];  Service: Endoscopy;  Laterality: N/A;  1250   CORONARY ARTERY BYPASS GRAFT  2014   DUODENAL STENT PLACEMENT N/A 12/09/2021   Procedure: AXIOS STENT PLACEMENT;  Surgeon: Irving Copas., MD;  Location: [facility];  Service: Gastroenterology;  Laterality: N/A;   ESOPHAGOGASTRODUODENOSCOPY N/A 12/09/2021   Procedure: ESOPHAGOGASTRODUODENOSCOPY (EGD);   Surgeon: Irving Copas., MD;  Location: [facility];  Service: Gastroenterology;  Laterality: N/A;   ESOPHAGOGASTRODUODENOSCOPY (EGD) WITH PROPOFOL N/A 01/20/2022   Procedure: ESOPHAGOGASTRODUODENOSCOPY (EGD) WITH PROPOFOL;  Surgeon: Rush Landmark Telford Nab., MD;  Location: [facility];  Service: Gastroenterology;  Laterality: N/A;   HERNIA REPAIR Left 2010   inguinal   POLYPECTOMY  03/11/2017   Procedure: POLYPECTOMY;  Surgeon: Rogene Houston, MD;  [facility];  Service: Endoscopy;;  colon   POLYPECTOMY  04/24/2022   Procedure: POLYPECTOMY;  Surgeon: Rogene Houston, MD;  [facility];  Service: Endoscopy;;   ROTATOR CUFF REPAIR Right 2014   STENT REMOVAL  01/20/2022   Procedure: STENT REMOVAL;  Surgeon: Irving Copas., MD;  Location: [facility];  Service: Gastroenterology;;   UPPER ESOPHAGEAL ENDOSCOPIC ULTRASOUND (EUS) N/A 12/09/2021   Procedure: UPPER ESOPHAGEAL ENDOSCOPIC ULTRASOUND (EUS);  Surgeon: Irving Copas., MD;  Location: [facility];  Service: Gastroenterology;  Laterality: N/A;   vericous veins  2001    FAMHx:  Family History  Problem Relation Age of Onset   Stroke Mother    Heart disease Father    Clotting disorder Father    Dementia Sister    Thyroid disease Sister    Hypertension Sister    Colon cancer Neg Hx    Esophageal cancer Neg Hx    Rectal cancer Neg Hx    Inflammatory bowel disease Neg Hx    Liver disease Neg Hx    Pancreatic cancer Neg Hx    Stomach cancer Neg Hx     SOCHx:   reports that he has never smoked. He has never used smokeless tobacco. He reports that he does not currently use alcohol. He reports that he does not use drugs.  ALLERGIES:  No Known Allergies  ROS: Pertinent items noted in HPI and remainder of comprehensive ROS otherwise negative.  HOME MEDS: Current Outpatient Medications on File Prior to Visit  Medication Sig Dispense Refill   acetaminophen (TYLENOL) 500 MG tablet  Take 1,000 mg by mouth every 6 (six) hours as needed for moderate pain or headache.     aspirin 81 MG tablet Take 1 tablet (81 mg total) by mouth daily. 30 tablet    Evolocumab (REPATHA SURECLICK) 403 MG/ML SOAJ Inject 1 Dose into the skin every 14 (fourteen) days. 2 mL 5   irbesartan (AVAPRO) 75 MG tablet Take 37.5 mg by mouth daily.     metoprolol tartrate (LOPRESSOR) 25 MG tablet Take 12.5 mg by mouth every morning.     Multiple Vitamins-Minerals (MULTIVITAMIN WITH MINERALS) tablet Take 1 tablet by mouth daily.     pantoprazole (PROTONIX) 40 MG tablet Take one tablet daily (Patient taking differently: Take one tablet QOD) 30 tablet 3   No current facility-administered medications on file prior to visit.    LABS/IMAGING: No results found for this or any previous visit (from the past 48 hour(s)). No results found.  LIPID PANEL:    Component Value Date/Time   CHOL 106 08/07/2021 1744   CHOL 101 11/28/2019 0834   TRIG  110 08/07/2021 1744   HDL 37 (L) 08/07/2021 1744   HDL 33 (L) 11/28/2019 0834   CHOLHDL 2.9 08/07/2021 1744   VLDL 22 08/07/2021 1744   LDLCALC 47 08/07/2021 1744   LDLCALC 44 11/28/2019 0834    WEIGHTS: Wt Readings from Last 3 Encounters:  07/15/22 170 lb (77.1 kg)  06/30/22 169 lb 8 oz (76.9 kg)  04/24/22 158 lb (71.7 kg)    VITALS: BP 118/70 (BP Location: Left Arm, Patient Position: Sitting, Cuff Size: Normal)   Pulse (!) 59   Resp 20   Ht '5\' 8"'$  (1.727 m)   Wt 170 lb (77.1 kg)   SpO2 99%   BMI 25.85 kg/m   EXAM: Deferred  EKG: Deferred  ASSESSMENT: Mixed dyslipidemia, goal LDL less than 70 Coronary disease status post two-vessel CABG (2014) Hypertension Statin and ezetimibe intolerance History of gallstone pancreatitis  PLAN: 1.   Mr. Delmonaco probably had gallstone pancreatitis with a complicated pancreatic pseudocyst.  He was taken off of Repatha for period of time but has been on it for 3 to 4 months.  He will need repeat lipids and I would  recommend an NMR and LP(a).  We will then go ahead and reapply for prior authorization to continue therapy if he is persistently at goal cholesterol.  Plan follow-up with me annually or sooner as necessary.  Pixie Casino, MD, [facility], [place] Director of the Advanced Lipid Disorders &   Cardiovascular Risk Reduction Clinic Diplomate of the American Board of Clinical Lipidology Attending Cardiologist  Direct Dial: [phone]  Fax: [phone]  Website:  [email or web address]  Nadean Corwin Jameica Couts 07/15/2022, 1:39 PM

## 2022-07-17 DIAGNOSIS — E785 Hyperlipidemia, unspecified: Secondary | ICD-10-CM | POA: Diagnosis not present

## 2022-07-18 LAB — LIPID PANEL
Chol/HDL Ratio: 2.5 ratio (ref 0.0–5.0)
Cholesterol, Total: 94 mg/dL — ABNORMAL LOW (ref 100–199)
HDL: 37 mg/dL — ABNORMAL LOW (ref >39)
LDL Chol Calc (NIH): 37 mg/dL (ref 0–99)
Triglycerides: 106 mg/dL (ref 0–149)
VLDL Cholesterol Cal: 20 mg/dL (ref 5–40)

## 2022-08-21 DIAGNOSIS — E785 Hyperlipidemia, unspecified: Secondary | ICD-10-CM | POA: Diagnosis not present

## 2022-08-21 DIAGNOSIS — I2510 Atherosclerotic heart disease of native coronary artery without angina pectoris: Secondary | ICD-10-CM | POA: Diagnosis not present

## 2022-08-21 DIAGNOSIS — I10 Essential (primary) hypertension: Secondary | ICD-10-CM | POA: Diagnosis not present

## 2022-09-15 DIAGNOSIS — Z23 Encounter for immunization: Secondary | ICD-10-CM | POA: Diagnosis not present

## 2022-09-20 DIAGNOSIS — E785 Hyperlipidemia, unspecified: Secondary | ICD-10-CM | POA: Diagnosis not present

## 2022-09-20 DIAGNOSIS — I10 Essential (primary) hypertension: Secondary | ICD-10-CM | POA: Diagnosis not present

## 2022-09-20 DIAGNOSIS — I2510 Atherosclerotic heart disease of native coronary artery without angina pectoris: Secondary | ICD-10-CM | POA: Diagnosis not present

## 2022-10-01 ENCOUNTER — Other Ambulatory Visit: Payer: Self-pay | Admitting: Internal Medicine

## 2022-10-01 DIAGNOSIS — I25810 Atherosclerosis of coronary artery bypass graft(s) without angina pectoris: Secondary | ICD-10-CM

## 2022-10-01 DIAGNOSIS — E785 Hyperlipidemia, unspecified: Secondary | ICD-10-CM

## 2022-10-09 ENCOUNTER — Encounter: Payer: Self-pay | Admitting: Cardiology

## 2022-10-09 ENCOUNTER — Ambulatory Visit: Payer: PPO | Attending: Cardiology | Admitting: Cardiology

## 2022-10-09 VITALS — BP 120/68 | HR 67 | Ht 68.0 in | Wt 178.0 lb

## 2022-10-09 DIAGNOSIS — M791 Myalgia, unspecified site: Secondary | ICD-10-CM | POA: Diagnosis not present

## 2022-10-09 DIAGNOSIS — T466X5A Adverse effect of antihyperlipidemic and antiarteriosclerotic drugs, initial encounter: Secondary | ICD-10-CM

## 2022-10-09 DIAGNOSIS — E782 Mixed hyperlipidemia: Secondary | ICD-10-CM | POA: Diagnosis not present

## 2022-10-09 DIAGNOSIS — I25119 Atherosclerotic heart disease of native coronary artery with unspecified angina pectoris: Secondary | ICD-10-CM | POA: Diagnosis not present

## 2022-10-09 NOTE — Patient Instructions (Addendum)
Medication Instructions:    STOP Lopressor   Labwork: None today  Testing/Procedures: None today  Follow-Up: 1 year   Any Other Special Instructions Will Be Listed Below (If Applicable).  If you need a refill on your cardiac medications before your next appointment, please call your pharmacy.

## 2022-10-09 NOTE — Progress Notes (Signed)
Cardiology Office Note  Date: 10/09/2022   ID: Sonya Pucci, DOB 10-19-1951, MRN [id number]  PCP:  Asencion Noble, MD  Cardiologist:  Rozann Lesches, MD Electrophysiologist:  None   Chief Complaint  Patient presents with   Cardiac follow-up    History of Present Illness: [name] is a 71 y.o. male last seen in November 2022.  He is here for a follow-up visit.  Reports no angina and stable exertional stamina.  He does mention sporadic, very brief episodes of lightheadedness without frank syncope.  Can occur either standing or seated position.  He has had maybe 4 or 5 of these episodes in the last year, each lasting only a few seconds.  No sense of palpitations.  Follow-up visit noted with Dr. Debara Pickett in July, I reviewed the note.  He is now back on Repatha and tolerating it well.  We went over his medications and discussed stopping Lopressor for now otherwise no change in current therapy.  I personally reviewed his ECG which shows sinus rhythm with R' in lead V1 and V2, septal Q waves.  Past Medical History:  Diagnosis Date   Colon polyps    benign per pt   Coronary artery disease    a. s/p CABG in 2014 with LIMA-LAD and SVG-D1 - performed in [place]   Fracture, ribs    Hyperlipidemia    Hypertension    Pancreatitis    Peripheral neuropathy     Past Surgical History:  Procedure Laterality Date   BALLOON DILATION N/A 12/09/2021   Procedure: BALLOON DILATION;  Surgeon: Mansouraty, Telford Nab., MD;  [facility];  Service: Gastroenterology;  Laterality: N/A;   BILIARY STENT PLACEMENT N/A 12/09/2021   Procedure: BILIARY STENT PLACEMENT;  Surgeon: Rush Landmark Telford Nab., MD;  [facility];  Service: Gastroenterology;  Laterality: N/A;  double pigtail stents x 2   BIOPSY  12/09/2021   Procedure: BIOPSY;  Surgeon: Rush Landmark Telford Nab., MD;  Location: [facility];  Service: Gastroenterology;;   BIOPSY  01/20/2022   Procedure: BIOPSY;  Surgeon: Irving Copas., MD;  Location: [facility];  Service: Gastroenterology;;   CATARACT EXTRACTION W/PHACO Right 02/15/2021   Procedure: CATARACT EXTRACTION PHACO AND INTRAOCULAR LENS PLACEMENT ([place]);  Surgeon: Baruch Goldmann, MD;  Location: [facility];  Service: Ophthalmology;  Laterality: Right;  CDE 8.65   CATARACT EXTRACTION W/PHACO Left 03/04/2021   Procedure: CATARACT EXTRACTION PHACO AND INTRAOCULAR LENS PLACEMENT ([facility]);  Surgeon: Baruch Goldmann, MD;  [facility]: [facility];  Service: Ophthalmology;  Laterality: Left;  CDE: 8.15   COLONOSCOPY N/A 03/11/2017   [name]: 2 sessile serrated polyps removed, next colonoscopy in 5 years   COLONOSCOPY WITH PROPOFOL N/A 04/24/2022   Procedure: COLONOSCOPY WITH PROPOFOL;  Surgeon: Rogene Houston, MD;  [facility];  Service: Endoscopy;  Laterality: N/A;  1250   CORONARY ARTERY BYPASS GRAFT  2014   DUODENAL STENT PLACEMENT N/A 12/09/2021   Procedure: AXIOS STENT PLACEMENT;  Surgeon: Irving Copas., MD;  [facility];  Service: Gastroenterology;  Laterality: N/A;   ESOPHAGOGASTRODUODENOSCOPY N/A 12/09/2021   Procedure: ESOPHAGOGASTRODUODENOSCOPY (EGD);  Surgeon: Irving Copas., MD;  Location: [facility];  Service: Gastroenterology;  Laterality: N/A;   ESOPHAGOGASTRODUODENOSCOPY (EGD) WITH PROPOFOL N/A 01/20/2022   Procedure: ESOPHAGOGASTRODUODENOSCOPY (EGD) WITH PROPOFOL;  Surgeon: Rush Landmark Telford Nab., MD;  [facility];  Service: Gastroenterology;  Laterality: N/A;   HERNIA REPAIR Left 2010   inguinal   POLYPECTOMY  03/11/2017   Procedure: POLYPECTOMY;  Surgeon:  Rogene Houston, MD;  [facility];  Service: Endoscopy;;  colon   POLYPECTOMY  04/24/2022   Procedure: POLYPECTOMY;  Surgeon: Rogene Houston, MD;  [facility];  Service: Endoscopy;;   ROTATOR CUFF REPAIR Right 2014   STENT REMOVAL  01/20/2022   Procedure: STENT REMOVAL;  Surgeon: Irving Copas., MD;  Location: [facility];  Service: Gastroenterology;;   UPPER ESOPHAGEAL ENDOSCOPIC ULTRASOUND (EUS) N/A 12/09/2021   Procedure: UPPER ESOPHAGEAL ENDOSCOPIC ULTRASOUND (EUS);  Surgeon: Irving Copas., MD;  Location: [facility];  Service: Gastroenterology;  Laterality: N/A;   vericous veins  2001    Current Outpatient Medications  Medication Sig Dispense Refill   acetaminophen (TYLENOL) 500 MG tablet Take 1,000 mg by mouth every 6 (six) hours as needed for moderate pain or headache.     aspirin 81 MG tablet Take 1 tablet (81 mg total) by mouth daily. 30 tablet    irbesartan (AVAPRO) 75 MG tablet Take 37.5 mg by mouth daily.     Multiple Vitamins-Minerals (MULTIVITAMIN WITH MINERALS) tablet Take 1 tablet by mouth daily.     pantoprazole (PROTONIX) 40 MG tablet Take one tablet daily (Patient taking differently: Take one tablet QOD) 30 tablet 3   REPATHA SURECLICK 132 MG/ML SOAJ INJECT 1 DOSE INTO THE SKIN EVERY 14 DAYS 2 mL 5   No current facility-administered medications for this visit.   Allergies:  Patient has no known allergies.   ROS: No orthopnea or PND.  No claudication.  Physical Exam: VS:  BP 120/68   Pulse 67   Ht '5\' 8"'$  (1.727 m)   Wt 178 lb (80.7 kg)   SpO2 98%   BMI 27.06 kg/m , BMI Body mass index is 27.06 kg/m.  Wt Readings from Last 3 Encounters:  10/09/22 178 lb (80.7 kg)  07/15/22 170 lb (77.1 kg)  06/30/22 169 lb 8 oz (76.9 kg)    General: Patient appears comfortable at rest. HEENT: Conjunctiva and lids normal. Neck: Supple, no elevated JVP or carotid bruits. Lungs: Clear to auscultation, nonlabored breathing at rest. Cardiac: Regular rate and rhythm, no S3 or significant systolic murmur. Extremities: No pitting edema.  ECG:  An ECG dated 08/07/2021 was personally reviewed today and demonstrated:  Sinus rhythm with left atrial enlargement and old anterior infarct pattern.  Recent Labwork: 12/12/2021: BUN 22; Hemoglobin 12.1; Platelets 224.0; Potassium 4.1;  Sodium 136 12/25/2021: ALT 37; AST 24 03/12/2022: Creatinine, Ser 1.20     Component Value Date/Time   CHOL 94 (L) 07/17/2022 0744   TRIG 106 07/17/2022 0744   HDL 37 (L) 07/17/2022 0744   CHOLHDL 2.5 07/17/2022 0744   CHOLHDL 2.9 08/07/2021 1744   VLDL 22 08/07/2021 1744   LDLCALC 37 07/17/2022 0744    Other Studies Reviewed Today:  Echocardiogram 11/23/2019:  1. Left ventricular ejection fraction, by visual estimation, is 60 to  65%. The left ventricle has normal function. There is no left ventricular  hypertrophy.   2. Left ventricular diastolic parameters are consistent with Grade I  diastolic dysfunction (impaired relaxation).   3. The left ventricle has no regional wall motion abnormalities.   4. Global right ventricle has low normal systolic function.The right  ventricular size is normal. No increase in right ventricular wall  thickness.   5. Left atrial size was normal.   6. Right atrial size was mildly dilated.   7. Mild mitral annular calcification.   8. The mitral valve is degenerative. Mild mitral  valve regurgitation.   9. The tricuspid valve is grossly normal. Tricuspid valve regurgitation  is mild.  10. The aortic valve is tricuspid. Aortic valve regurgitation is not  visualized. No evidence of aortic valve sclerosis or stenosis.  11. The pulmonic valve was grossly normal. Pulmonic valve regurgitation is  mild.  12. Aortic dilatation noted.  13. There is mild dilatation of the aortic root.  14. Normal pulmonary artery systolic pressure.  15. The inferior vena cava is normal in size with greater than 50%  respiratory variability, suggesting right atrial pressure of 3 mmHg.   Assessment and Plan:  1.  Multivessel CAD status post CABG in 2014.  He reports no angina.  ECG reviewed.  Plan to stop Lopressor at this time given low resting heart rate and intermittent brief episodes of lightheadedness.  Continue aspirin, Avapro, and Repatha otherwise.  2.  Intermittent  episodes of lightheadedness as discussed above.  Stopping beta-blocker for now.  If these increase in frequency, cardiac monitoring can be considered.  3.  Mixed hyperlipidemia with statin and Zetia intolerance.  He is on Repatha and followed through the lipid clinic.  Medication Adjustments/Labs and Tests Ordered: Current medicines are reviewed at length with the patient today.  Concerns regarding medicines are outlined above.   Tests Ordered: Orders Placed This Encounter  Procedures   EKG 12-Lead    Medication Changes: No orders of the defined types were placed in this encounter.   Disposition:  Follow up  1 year, sooner if needed.  Signed, Satira Sark, MD, [facility] 10/09/2022 2:39 PM    [facility] [address], [place], [place] [address] Phone: [phone]; Fax: [phone]

## 2022-10-14 ENCOUNTER — Encounter (INDEPENDENT_AMBULATORY_CARE_PROVIDER_SITE_OTHER): Payer: Self-pay | Admitting: *Deleted

## 2022-10-20 ENCOUNTER — Telehealth: Payer: Self-pay | Admitting: Cardiology

## 2022-10-20 NOTE — Telephone Encounter (Signed)
Ultrasound was ordered by GI. Pt will call them for results.

## 2022-10-20 NOTE — Telephone Encounter (Signed)
Patient states he is requesting a call back to discuss ultrasound results.

## 2022-10-21 ENCOUNTER — Other Ambulatory Visit (INDEPENDENT_AMBULATORY_CARE_PROVIDER_SITE_OTHER): Payer: Self-pay

## 2022-10-21 DIAGNOSIS — K863 Pseudocyst of pancreas: Secondary | ICD-10-CM

## 2022-10-27 ENCOUNTER — Ambulatory Visit (HOSPITAL_COMMUNITY)
Admission: RE | Admit: 2022-10-27 | Discharge: 2022-10-27 | Disposition: A | Discharge status: Home / Self Care | Payer: PPO | Source: Ambulatory Visit | Attending: Gastroenterology | Admitting: Gastroenterology

## 2022-10-27 DIAGNOSIS — K760 Fatty (change of) liver, not elsewhere classified: Secondary | ICD-10-CM | POA: Diagnosis not present

## 2022-10-27 DIAGNOSIS — Z8719 Personal history of other diseases of the digestive system: Secondary | ICD-10-CM | POA: Diagnosis not present

## 2022-10-27 DIAGNOSIS — K863 Pseudocyst of pancreas: Secondary | ICD-10-CM | POA: Insufficient documentation

## 2022-10-30 ENCOUNTER — Other Ambulatory Visit (INDEPENDENT_AMBULATORY_CARE_PROVIDER_SITE_OTHER): Payer: Self-pay

## 2022-10-30 DIAGNOSIS — R748 Abnormal levels of other serum enzymes: Secondary | ICD-10-CM

## 2022-10-30 DIAGNOSIS — K863 Pseudocyst of pancreas: Secondary | ICD-10-CM

## 2022-10-30 DIAGNOSIS — R6881 Early satiety: Secondary | ICD-10-CM

## 2022-10-30 DIAGNOSIS — R630 Anorexia: Secondary | ICD-10-CM

## 2022-10-30 DIAGNOSIS — R634 Abnormal weight loss: Secondary | ICD-10-CM

## 2022-10-30 DIAGNOSIS — E782 Mixed hyperlipidemia: Secondary | ICD-10-CM

## 2022-10-30 DIAGNOSIS — Z86010 Personal history of colonic polyps: Secondary | ICD-10-CM

## 2022-10-30 DIAGNOSIS — K859 Acute pancreatitis without necrosis or infection, unspecified: Secondary | ICD-10-CM

## 2022-10-30 DIAGNOSIS — K8502 Idiopathic acute pancreatitis with infected necrosis: Secondary | ICD-10-CM

## 2022-10-30 DIAGNOSIS — D649 Anemia, unspecified: Secondary | ICD-10-CM

## 2022-10-30 DIAGNOSIS — I10 Essential (primary) hypertension: Secondary | ICD-10-CM

## 2022-10-31 ENCOUNTER — Other Ambulatory Visit: Payer: Self-pay | Admitting: *Deleted

## 2022-10-31 DIAGNOSIS — K863 Pseudocyst of pancreas: Secondary | ICD-10-CM

## 2022-11-03 DIAGNOSIS — I2510 Atherosclerotic heart disease of native coronary artery without angina pectoris: Secondary | ICD-10-CM | POA: Diagnosis not present

## 2022-11-03 DIAGNOSIS — R748 Abnormal levels of other serum enzymes: Secondary | ICD-10-CM | POA: Diagnosis not present

## 2022-11-03 DIAGNOSIS — Z86010 Personal history of colonic polyps: Secondary | ICD-10-CM | POA: Diagnosis not present

## 2022-11-03 DIAGNOSIS — K8502 Idiopathic acute pancreatitis with infected necrosis: Secondary | ICD-10-CM | POA: Diagnosis not present

## 2022-11-03 DIAGNOSIS — K863 Pseudocyst of pancreas: Secondary | ICD-10-CM | POA: Diagnosis not present

## 2022-11-03 DIAGNOSIS — E782 Mixed hyperlipidemia: Secondary | ICD-10-CM | POA: Diagnosis not present

## 2022-11-03 DIAGNOSIS — I10 Essential (primary) hypertension: Secondary | ICD-10-CM | POA: Diagnosis not present

## 2022-11-03 DIAGNOSIS — D649 Anemia, unspecified: Secondary | ICD-10-CM | POA: Diagnosis not present

## 2022-11-03 DIAGNOSIS — R634 Abnormal weight loss: Secondary | ICD-10-CM | POA: Diagnosis not present

## 2022-11-03 DIAGNOSIS — K859 Acute pancreatitis without necrosis or infection, unspecified: Secondary | ICD-10-CM | POA: Diagnosis not present

## 2022-11-03 DIAGNOSIS — R6881 Early satiety: Secondary | ICD-10-CM | POA: Diagnosis not present

## 2022-11-03 DIAGNOSIS — R630 Anorexia: Secondary | ICD-10-CM | POA: Diagnosis not present

## 2022-11-03 DIAGNOSIS — Z79899 Other long term (current) drug therapy: Secondary | ICD-10-CM | POA: Diagnosis not present

## 2022-11-04 LAB — LIPASE: Lipase: 62 U/L (ref 13–78)

## 2022-11-04 LAB — COMPREHENSIVE METABOLIC PANEL
ALT: 19 IU/L (ref 0–44)
AST: 22 IU/L (ref 0–40)
Albumin/Globulin Ratio: 1.8 (ref 1.2–2.2)
Albumin: 4.8 g/dL (ref 3.8–4.8)
Alkaline Phosphatase: 97 IU/L (ref 44–121)
BUN/Creatinine Ratio: 18 (ref 10–24)
BUN: 19 mg/dL (ref 8–27)
Bilirubin Total: 0.4 mg/dL (ref 0.0–1.2)
CO2: 23 mmol/L (ref 20–29)
Calcium: 9.6 mg/dL (ref 8.6–10.2)
Chloride: 103 mmol/L (ref 96–106)
Creatinine, Ser: 1.08 mg/dL (ref 0.76–1.27)
Globulin, Total: 2.6 g/dL (ref 1.5–4.5)
Glucose: 108 mg/dL — ABNORMAL HIGH (ref 70–99)
Potassium: 4.4 mmol/L (ref 3.5–5.2)
Sodium: 140 mmol/L (ref 134–144)
Total Protein: 7.4 g/dL (ref 6.0–8.5)
eGFR: 72 mL/min/{1.73_m2} (ref >59)

## 2022-11-10 ENCOUNTER — Ambulatory Visit (INDEPENDENT_AMBULATORY_CARE_PROVIDER_SITE_OTHER): Payer: PPO | Admitting: Gastroenterology

## 2022-11-10 ENCOUNTER — Encounter (INDEPENDENT_AMBULATORY_CARE_PROVIDER_SITE_OTHER): Payer: Self-pay | Admitting: Gastroenterology

## 2022-11-10 VITALS — BP 120/68 | HR 64 | Temp 97.1°F | Ht 68.0 in | Wt 178.0 lb

## 2022-11-10 DIAGNOSIS — K859 Acute pancreatitis without necrosis or infection, unspecified: Secondary | ICD-10-CM | POA: Diagnosis not present

## 2022-11-10 DIAGNOSIS — K863 Pseudocyst of pancreas: Secondary | ICD-10-CM | POA: Diagnosis not present

## 2022-11-10 NOTE — Patient Instructions (Signed)
Proceed with scheduled MRCP Avoid alcohol consumption Will discuss MRCP findings with Dr. Rush Landmark and will get back to you with next steps regarding cyst management.

## 2022-11-10 NOTE — Progress Notes (Signed)
Maylon Peppers, M.D. Gastroenterology & Hepatology [facility] [address] [place], [place] [address]  Primary Care Physician: Asencion Noble, MD [address] [place] [address]  I will communicate my assessment and recommendations to the referring MD via EMR.  Problems: Necrotizing pancreatitis (unclear if related to Lyrica or microlithiasis) complicated by pseudocyst formation and walled off necrosis status postcyst gastrostromy  History of Present Illness: [name] is a 71 y.o. male with PMH necrotizing pancreatitis (unclear if related to Lyrica or microlithiasis) complicated by pseudocyst formation and walled off necrosis status postcyst gastrostromy, coronary artery disease status post CABG, hypertension, hyperlipidemia and peripheral neuropathy, who presents for follow up of pancreatic pseudocyst.  Patient reports that he has been felling well and does not present any major complaints at the moment. He does not have any food restrictions, but reports when he eats chicken wings he had some discomfort in his upper abdomen. He denies having any pain at the moment. He has slowly gained 20 lb since he had the AXIOS stent placed. He does not feel any early satiety and states he actually eats a lot. The patient denies having any nausea, vomiting, fever, chills, hematochezia, melena, hematemesis, abdominal distention, abdominal pain, diarrhea, jaundice, pruritus.  He denies drinking any more alcohol since he had his episode of pancreatitis. Is not taking Lyrica any more.  As part of the necrotizing pancreatitis surveillance after removal of the AXIOS stent, he underwent a CT of the pancreas on 03/12/2022 which showed presence of decreased fat stranding and retroperitoneal fluid in the pancreas, with a diminishment in size of the pancreatic neck collection measuring 2 x 0.8 cm, also presence of mild intra and extrahepatic ductal dilation  with CBD measuring 1 cm and truncating at the superior pancreatic head.  This was followed by a Doppler of the liver on 04/08/2022 which showed a large draining veins in the left upper quadrant concerning for enlarged collateral veins at the hilum and 3.5 cm cyst in the pancreatic head. lastly, he underwent a repeat ultrasound of the right upper quadrant on 10/27/2022 which showed an increase in the size of the pancreatic cyst with measurements of 13.2 x 7.6 x 9.0 cm with a possible associated mass effect on the CBD (1 cm in size).  Due to this, I ordered an MRCP after discussing the case with Dr. Dorene Sorrow.  Most recent LFTs from 11/03/2022 showed AST of 22, ALT of 19, total bilirubin 0.4, alkaline phosphatase 97, lipase of 62, normal renal function and electrolytes.  Last EGD 01/20/22: - No gross lesions in esophagus. Z-line regular, 37 cm from the incisors. - Widely patent and non-obstructing Schatzki ring. - 3 cm hiatal hernia. - Previously placed Cystgastrostomy stents were in place - these were removed. - Entering into the cyst cavity, necrosis was noted, though overall improved compared to initially. Necrosectomy was performed as noted above with clearance of the cavity. - Erythematous mucosa in the stomach and gastric erosions in the antrum. Biopsied for HP. - No gross lesions in the duodenal bulb, in the first portion of the duodenum and in the second portion of the duodenum.  EUS 12/09/2021: - A cystic lesion was seen in the entire pancreas. Tissue has not been obtained. However, the endosonographic appearance is consistent with a pancreatic pseudocyst with evidence of walled-off necrosis. AXIOS Cystgastrostomy created. Cyst entered with evidence of some necrosis though felt <40% of the cyst that was entered. Dilated tract. Double pigtails placed through  the AXIOS. 700 cc of dark bloody output removed from the cyst. - Evidence of what appears to be dilated intrahepatics are noted on the  EUS liver imaging. - Could not visualize bile duct due to the cyst within the region of the HOP.  Last Colonoscopy: 04/24/2022 - Diverticulosis - One small polyp in the proximal sigmoid colon. Biopsied. - External and internal hemorrhoids.  Recommended repeat colonoscopy in 5 years  Past Medical History: Past Medical History:  Diagnosis Date   Colon polyps    benign per pt   Coronary artery disease    a. s/p CABG in 2014 with LIMA-LAD and SVG-D1 - performed in [place]   Fracture, ribs    Hyperlipidemia    Hypertension    Pancreatitis    Peripheral neuropathy     Past Surgical History: Past Surgical History:  Procedure Laterality Date   BALLOON DILATION N/A 12/09/2021   Procedure: BALLOON DILATION;  Surgeon: Mansouraty, Telford Nab., MD;  [facility];  Service: Gastroenterology;  Laterality: N/A;   BILIARY STENT PLACEMENT N/A 12/09/2021   Procedure: BILIARY STENT PLACEMENT;  Surgeon: Rush Landmark Telford Nab., MD;  [facility];  Service: Gastroenterology;  Laterality: N/A;  double pigtail stents x 2   BIOPSY  12/09/2021   Procedure: BIOPSY;  Surgeon: Rush Landmark Telford Nab., MD;  Location: [facility];  Service: Gastroenterology;;   BIOPSY  01/20/2022   Procedure: BIOPSY;  Surgeon: Irving Copas., MD;  Location: [facility];  Service: Gastroenterology;;   CATARACT EXTRACTION W/PHACO Right 02/15/2021   Procedure: CATARACT EXTRACTION PHACO AND INTRAOCULAR LENS PLACEMENT ([place]);  Surgeon: Baruch Goldmann, MD;  [facility]: [facility];  Service: Ophthalmology;  Laterality: Right;  CDE 8.65   CATARACT EXTRACTION W/PHACO Left 03/04/2021   Procedure: CATARACT EXTRACTION PHACO AND INTRAOCULAR LENS PLACEMENT ([facility]);  Surgeon: Baruch Goldmann, MD;  [facility]: [facility];  Service: Ophthalmology;  Laterality: Left;  CDE: 8.15   COLONOSCOPY N/A 03/11/2017   [name]: 2 sessile serrated polyps removed, next colonoscopy in 5 years   COLONOSCOPY WITH PROPOFOL N/A 04/24/2022   Procedure:  COLONOSCOPY WITH PROPOFOL;  Surgeon: Rogene Houston, MD;  [facility];  Service: Endoscopy;  Laterality: N/A;  1250   CORONARY ARTERY BYPASS GRAFT  2014   DUODENAL STENT PLACEMENT N/A 12/09/2021   Procedure: AXIOS STENT PLACEMENT;  Surgeon: Irving Copas., MD;  [facility];  Service: Gastroenterology;  Laterality: N/A;   ESOPHAGOGASTRODUODENOSCOPY N/A 12/09/2021   Procedure: ESOPHAGOGASTRODUODENOSCOPY (EGD);  Surgeon: Irving Copas., MD;  Location: [facility];  Service: Gastroenterology;  Laterality: N/A;   ESOPHAGOGASTRODUODENOSCOPY (EGD) WITH PROPOFOL N/A 01/20/2022   Procedure: ESOPHAGOGASTRODUODENOSCOPY (EGD) WITH PROPOFOL;  Surgeon: Rush Landmark Telford Nab., MD;  [facility];  Service: Gastroenterology;  Laterality: N/A;   HERNIA REPAIR Left 2010   inguinal   POLYPECTOMY  03/11/2017   Procedure: POLYPECTOMY;  Surgeon: Rogene Houston, MD;  [facility];  Service: Endoscopy;;  colon   POLYPECTOMY  04/24/2022   Procedure: POLYPECTOMY;  Surgeon: Rogene Houston, MD;  [facility];  Service: Endoscopy;;   ROTATOR CUFF REPAIR Right 2014   STENT REMOVAL  01/20/2022   Procedure: STENT REMOVAL;  Surgeon: Irving Copas., MD;  Location: [facility];  Service: Gastroenterology;;   UPPER ESOPHAGEAL ENDOSCOPIC ULTRASOUND (EUS) N/A 12/09/2021   Procedure: UPPER ESOPHAGEAL ENDOSCOPIC ULTRASOUND (EUS);  Surgeon: Irving Copas., MD;  Location: [facility];  Service: Gastroenterology;  Laterality: N/A;   vericous veins  2001  Family History: Family History  Problem Relation Age of Onset   Stroke Mother    Heart disease Father    Clotting disorder Father    Dementia Sister    Thyroid disease Sister    Hypertension Sister    Colon cancer Neg Hx    Esophageal cancer Neg Hx    Rectal cancer Neg Hx    Inflammatory bowel disease Neg Hx    Liver disease Neg Hx    Pancreatic cancer Neg Hx    Stomach cancer  Neg Hx     Social History: Social History   Tobacco Use  Smoking Status Never  Smokeless Tobacco Never   Social History   Substance and Sexual Activity  Alcohol Use Not Currently   Social History   Substance and Sexual Activity  Drug Use No    Allergies: No Known Allergies  Medications: Current Outpatient Medications  Medication Sig Dispense Refill   acetaminophen (TYLENOL) 500 MG tablet Take 1,000 mg by mouth every 6 (six) hours as needed for moderate pain or headache.     aspirin 81 MG tablet Take 1 tablet (81 mg total) by mouth daily. 30 tablet    irbesartan (AVAPRO) 75 MG tablet Take 37.5 mg by mouth daily.     Multiple Vitamins-Minerals (MULTIVITAMIN WITH MINERALS) tablet Take 1 tablet by mouth daily.     pantoprazole (PROTONIX) 40 MG tablet Take one tablet daily (Patient taking differently: Take one tablet QOD) 30 tablet 3   REPATHA SURECLICK 371 MG/ML SOAJ INJECT 1 DOSE INTO THE SKIN EVERY 14 DAYS 2 mL 5   No current facility-administered medications for this visit.    Review of Systems: GENERAL: negative for malaise, night sweats HEENT: No changes in hearing or vision, no nose bleeds or other nasal problems. NECK: Negative for lumps, goiter, pain and significant neck swelling RESPIRATORY: Negative for cough, wheezing CARDIOVASCULAR: Negative for chest pain, leg swelling, palpitations, orthopnea GI: SEE HPI MUSCULOSKELETAL: Negative for joint pain or swelling, back pain, and muscle pain. SKIN: Negative for lesions, rash PSYCH: Negative for sleep disturbance, mood disorder and recent psychosocial stressors. HEMATOLOGY Negative for prolonged bleeding, bruising easily, and swollen nodes. ENDOCRINE: Negative for cold or heat intolerance, polyuria, polydipsia and goiter. NEURO: negative for tremor, gait imbalance, syncope and seizures. The remainder of the review of systems is noncontributory.   Physical Exam: BP 120/68 (BP Location: Left Arm, Patient Position:  Sitting, Cuff Size: Large)   Pulse 64   Temp (!) 97.1 F (36.2 C) (Temporal)   Ht '5\' 8"'$  (1.727 m)   Wt 178 lb (80.7 kg)   BMI 27.06 kg/m  GENERAL: The patient is AO x3, in no acute distress. HEENT: Head is normocephalic and atraumatic. EOMI are intact. Mouth is well hydrated and without lesions. NECK: Supple. No masses LUNGS: Clear to auscultation. No presence of rhonchi/wheezing/rales. Adequate chest expansion HEART: RRR, normal s1 and s2. ABDOMEN: very mild epigastric tenderness, no guarding, no peritoneal signs, and nondistended. BS +. No masses. EXTREMITIES: Without any cyanosis, clubbing, rash, lesions or edema. NEUROLOGIC: AOx3, no focal motor deficit. SKIN: no jaundice, no rashes  Imaging/Labs: as above  I personally reviewed and interpreted the available labs, imaging and endoscopic files.  Impression and Plan: [name] is a 71 y.o. male with PMH necrotizing pancreatitis (unclear if related to Lyrica or microlithiasis) complicated by pseudocyst formation and walled off necrosis status postcyst gastrostromy, coronary artery disease status post CABG, hypertension, hyperlipidemia and peripheral neuropathy, who presents for follow  up of pancreatic pseudocyst.  The patient has been asymptomatic after he underwent necrosectomy in the past.  However, he was found to have worsening enlargement of his pancreatic pseudocyst based on most recent ultrasound of the abdomen.  Notably, his liver disease have remained stable even though there is presence of some CBD enlargement.  He otherwise denies any complaints.  We discussed that the MRCP will give [place] a better idea of what is his current pancreatobiliary status.  I will discuss the results with Dr. Rush Landmark to determine the next best steps in his care.  Fortunately, he has not consumed any more alcohol and should abstain from this.  - Proceed with scheduled MRCP - Avoid alcohol consumption - Will discuss MRCP findings with Dr.  Rush Landmark regarding next steps regarding cyst management.  All questions were answered.      Maylon Peppers, MD Gastroenterology and Hepatology [facility]

## 2022-11-19 DIAGNOSIS — I2510 Atherosclerotic heart disease of native coronary artery without angina pectoris: Secondary | ICD-10-CM | POA: Diagnosis not present

## 2022-11-19 DIAGNOSIS — K863 Pseudocyst of pancreas: Secondary | ICD-10-CM | POA: Diagnosis not present

## 2022-11-19 DIAGNOSIS — I10 Essential (primary) hypertension: Secondary | ICD-10-CM | POA: Diagnosis not present

## 2022-11-19 DIAGNOSIS — T466X5A Adverse effect of antihyperlipidemic and antiarteriosclerotic drugs, initial encounter: Secondary | ICD-10-CM | POA: Diagnosis not present

## 2022-11-20 ENCOUNTER — Ambulatory Visit (HOSPITAL_COMMUNITY)
Admission: RE | Admit: 2022-11-20 | Discharge: 2022-11-20 | Disposition: A | Discharge status: Home / Self Care | Payer: PPO | Source: Ambulatory Visit | Attending: Gastroenterology | Admitting: Gastroenterology

## 2022-11-20 ENCOUNTER — Other Ambulatory Visit: Payer: Self-pay | Admitting: Gastroenterology

## 2022-11-20 DIAGNOSIS — I700 Atherosclerosis of aorta: Secondary | ICD-10-CM | POA: Diagnosis not present

## 2022-11-20 DIAGNOSIS — K863 Pseudocyst of pancreas: Secondary | ICD-10-CM

## 2022-11-20 DIAGNOSIS — R935 Abnormal findings on diagnostic imaging of other abdominal regions, including retroperitoneum: Secondary | ICD-10-CM | POA: Diagnosis not present

## 2022-11-20 DIAGNOSIS — K8689 Other specified diseases of pancreas: Secondary | ICD-10-CM | POA: Diagnosis not present

## 2022-11-20 IMAGING — CT CT ABD-PELV W/ CM
2 of 5 series · 16 of 46 positions shown, 18 images · IV contrast (Omnipaque or Isovue)
Comparison: None.

CLINICAL DATA: Left lower quadrant abdominal pain

EXAM:
CT ABDOMEN AND PELVIS WITH CONTRAST
TECHNIQUE: Multidetector CT imaging of the abdomen and pelvis was performed
using the standard protocol following bolus administration of
intravenous contrast.
CONTRAST:  80mL OMNIPAQUE IOHEXOL 350 MG/ML SOLN

[Series 2: axial st · axial · 0.81mm/px · z∈[+921,+1351]mm · 13 of 98 slices shown, 15 images]
[im 6/98  soft-tissue]
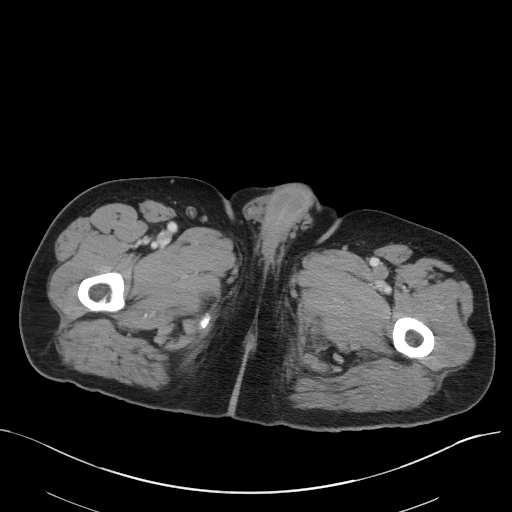
[im 6/98  bone]
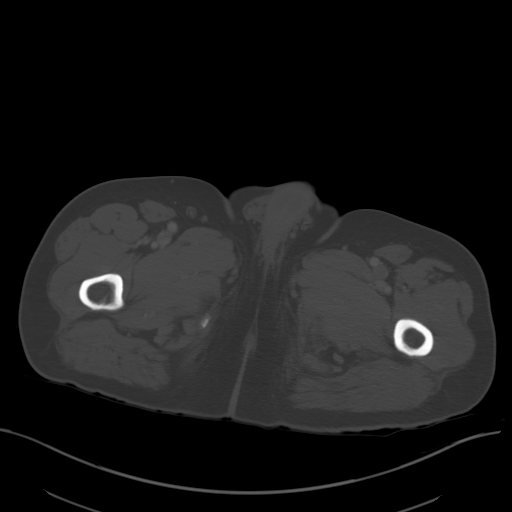
[im 11/98  soft-tissue]
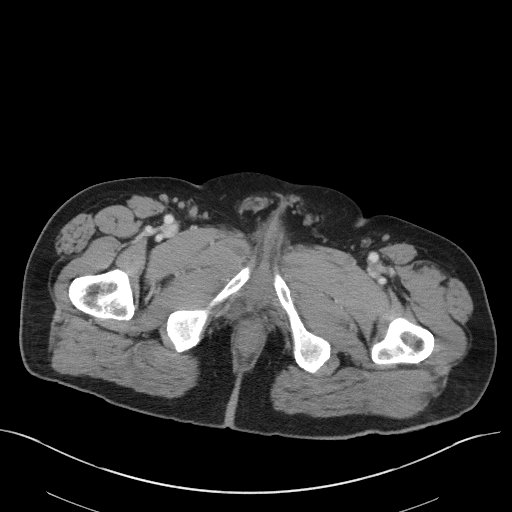
[im 22/98  soft-tissue]
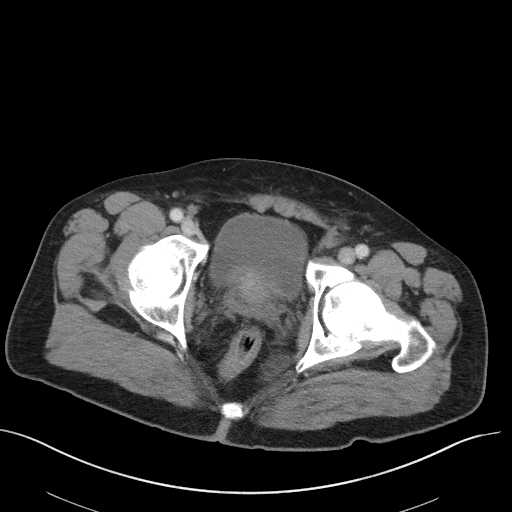
[im 27/98  soft-tissue]
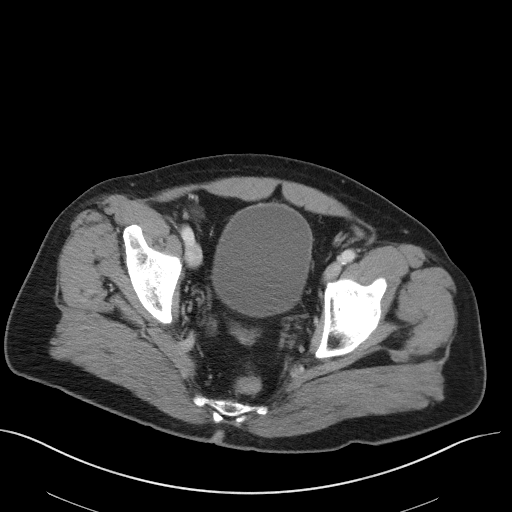
[im 33/98  soft-tissue]
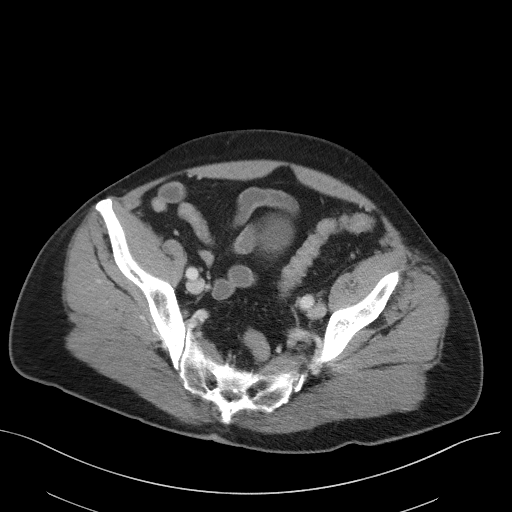
[im 44/98  soft-tissue]
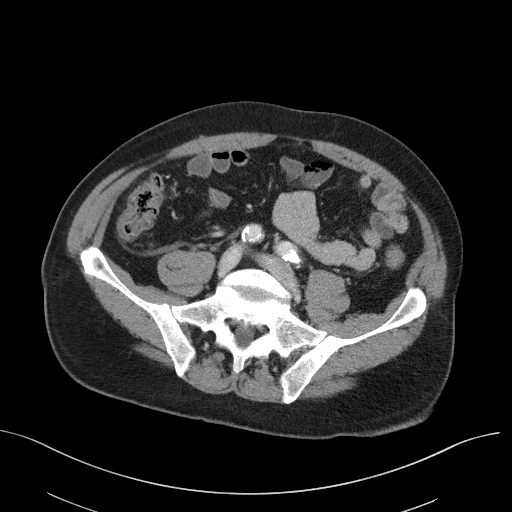
[im 49/98  soft-tissue]
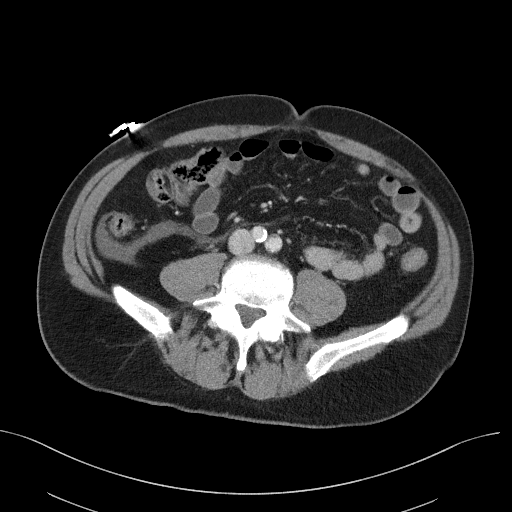
[im 54/98  soft-tissue]
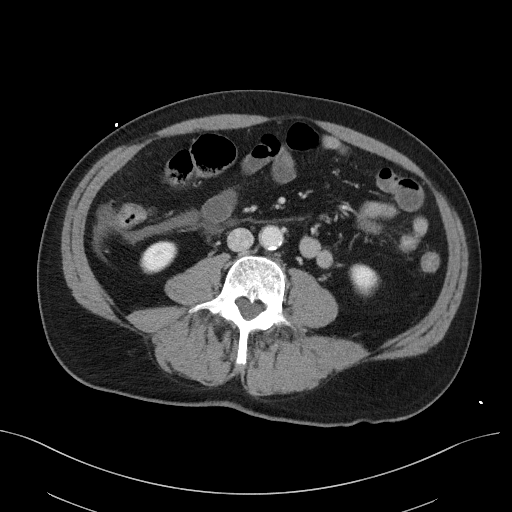
[im 65/98  soft-tissue]
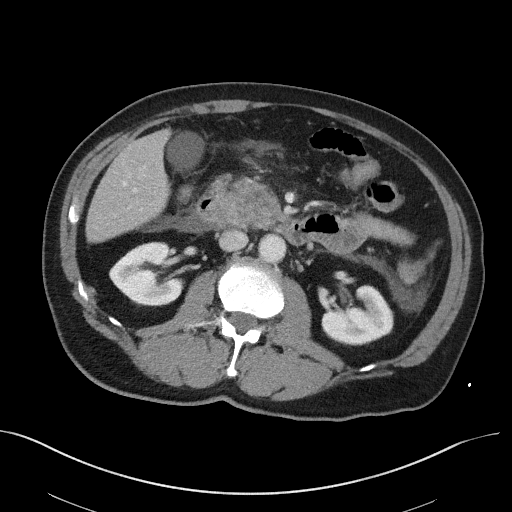
[im 65/98  bone]
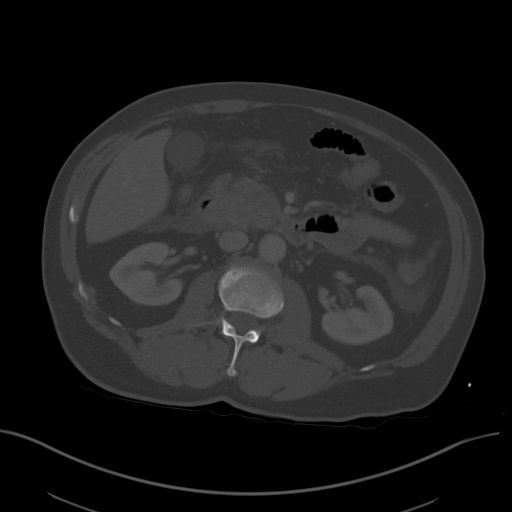
[im 71/98  soft-tissue]
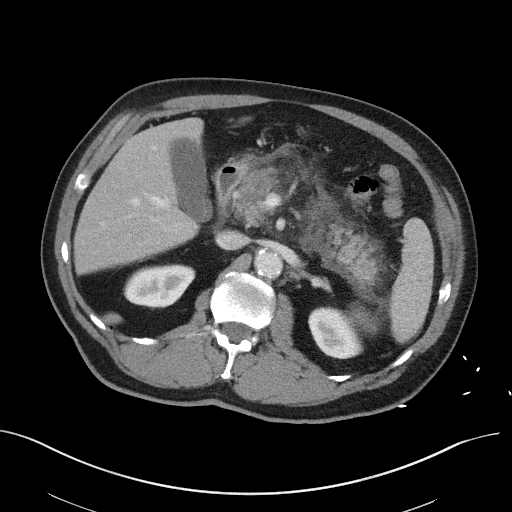
[im 76/98  soft-tissue]
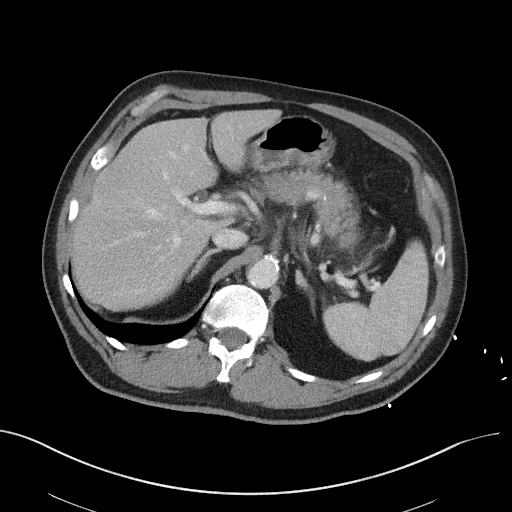
[im 87/98  soft-tissue]
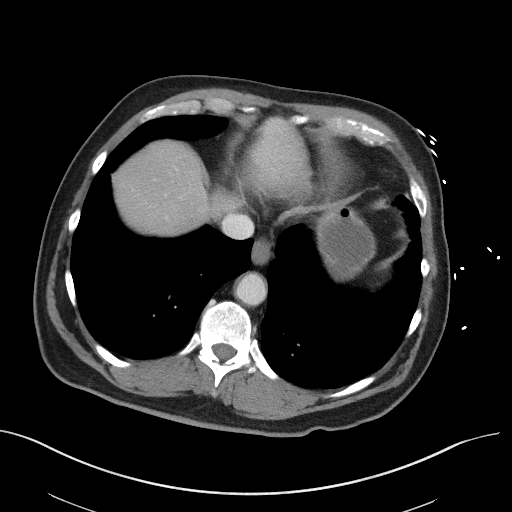
[im 92/98  soft-tissue]
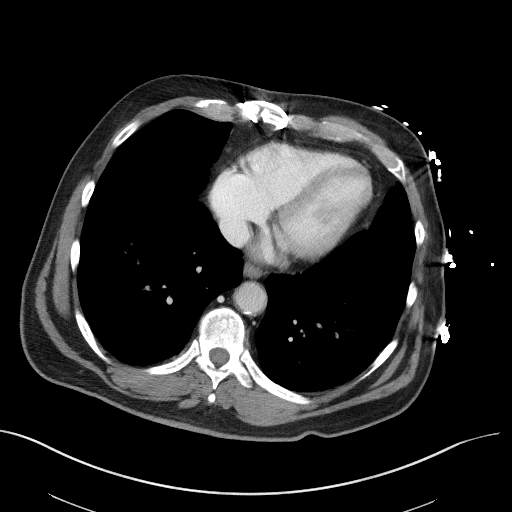

[Series 5: coronal st · coronal · 0.80mm/px · 3 of 110 slices shown]
[im 37/110  soft-tissue]
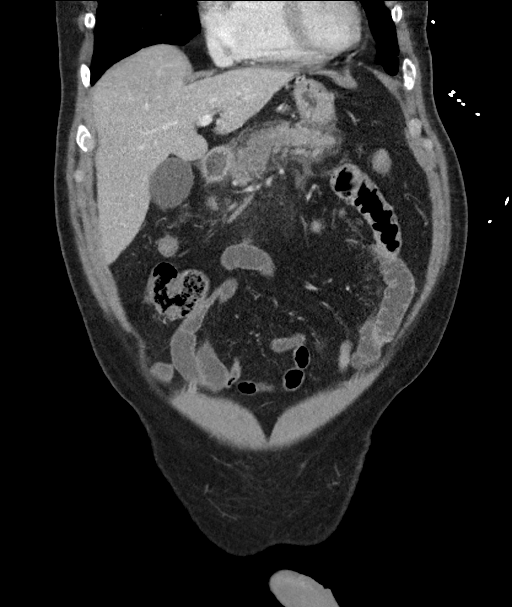
[im 49/110  soft-tissue]
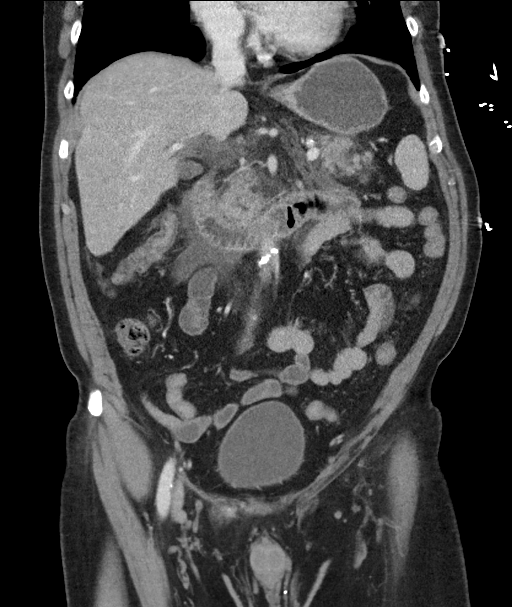
[im 61/110  soft-tissue]
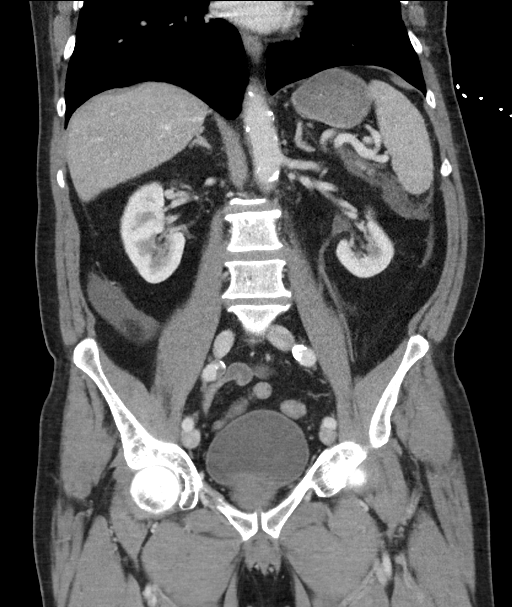

[16 of 46 positions shown; findings below may reference images not displayed]

FINDINGS: Lower chest: Small hiatal hernia.  No acute abnormality.

Hepatobiliary: No focal liver abnormality is seen. No gallstones,
gallbladder wall thickening, or biliary dilatation.

Pancreas: Inflammatory change and trace fluid seen about the
pancreas. Mild gland hypoenhancement of the neck/body. Small volume
fluid tracking into the right greater than left paracolic gutters.

Spleen: Normal in size without focal abnormality.

Adrenals/Urinary Tract: Adrenal glands are unremarkable. Kidneys are
normal, without renal calculi, focal lesion, or hydronephrosis.
Bladder is unremarkable.

Stomach/Bowel: Stomach is within normal limits. Appendix appears
normal. No evidence of bowel wall thickening, distention, or
inflammatory changes.

Vascular/Lymphatic: No significant vascular findings are present. No
enlarged abdominal or pelvic lymph nodes.

Reproductive: Prostatomegaly, measuring up to 5.2 cm.

Other: No abdominal wall hernia or abnormality. No abdominopelvic
ascites.

Musculoskeletal: No acute or significant osseous findings.
IMPRESSION: Findings compatible with acute pancreatitis.Mild gland
hypoenhancement of the neck/body of the pancreas, concerning for
necrosis.

## 2022-11-20 MED ORDER — GADOBUTROL 1 MMOL/ML IV SOLN
8.0000 mL | Freq: Once | INTRAVENOUS | Status: AC | PRN
  Administered 2022-11-20: 8 mL via INTRAVENOUS

## 2022-11-21 ENCOUNTER — Telehealth: Payer: Self-pay

## 2022-11-21 NOTE — Telephone Encounter (Signed)
-----   Message from Irving Copas., MD sent at 11/21/2022  3:14 PM EST ----- [name], Thanks for forwarding me these results. Looks like it is going to need another drainage. I called and spoke with the patient and agree with your overall assessment he has been doing pretty well but with the progressive biliary dilation I am getting concerned that he may have issues sooner rather than later (thankfully no jaundice or dark in urine currently). He needs to have a drainage procedure and we talked about doing that versus continued close monitoring. We also discussed that with the enlarging cyst that a pancreatic duct disruption, even though MRI/MRCP did not show this finding that it likely is still a reason and he may require a pancreatic ERCP.  He wants to think on that further and discuss it further with me. He has agreed to move forward with a cystgastrostomy in the interim.    Salim Forero, Please add this patient on for December 7 EUS cystgastrostomy. I do not see any other medication adjustments that will be needed. We will give him antibiotics at the time of his procedure. Thanks. GM ----- Message ----- From: Harvel Quale, MD Sent: 11/20/2022   3:19 PM EST To: Irving Copas., MD  [facility],  Hope you're good. Following up with this case, I just received the MRCP findings from this patient. I reviewed the images and don't see any leak, it is a single 12 cm cavity seems well matured, compressing gastric chamber and CBD. Last LFTs were normal. He only endorsed having epigastric pain when eating heavier meals, otherwise no other symptoms.  Please let me know your thought and if he would be a candidate for EUS with drainage.  Thanks,  Quillian Quince

## 2022-11-24 ENCOUNTER — Other Ambulatory Visit: Payer: Self-pay

## 2022-11-24 DIAGNOSIS — K863 Pseudocyst of pancreas: Secondary | ICD-10-CM

## 2022-11-24 DIAGNOSIS — R748 Abnormal levels of other serum enzymes: Secondary | ICD-10-CM

## 2022-11-24 NOTE — Telephone Encounter (Signed)
EUS has been scheduled for 11/27/22 at 330 pm at [facility] with GM  Left message on machine to call back

## 2022-11-24 NOTE — Telephone Encounter (Signed)
EUS scheduled, pt instructed and medications reviewed.  Patient instructions mailed to home.  Patient to call with any questions or concerns.  

## 2022-11-26 ENCOUNTER — Encounter (HOSPITAL_COMMUNITY): Payer: Self-pay | Admitting: Gastroenterology

## 2022-11-26 NOTE — Anesthesia Preprocedure Evaluation (Addendum)
Anesthesia Evaluation  Patient identified by MRN, date of birth, ID band Patient awake    Reviewed: Allergy & Precautions, NPO status , Patient's Chart, lab work & pertinent test results  History of Anesthesia Complications Negative for: history of anesthetic complications  Airway Mallampati: III  TM Distance: >3 FB Neck ROM: Full    Dental no notable dental hx.    Pulmonary neg pulmonary ROS   Pulmonary exam normal breath sounds clear to auscultation       Cardiovascular Exercise Tolerance: Good hypertension (irbesartan), Pt. on medications (-) angina + CAD and + CABG (2014)   Rhythm:Regular Rate:Normal  HLD  TTE 11/23/2019: IMPRESSIONS     1. Left ventricular ejection fraction, by visual estimation, is 60 to  65%. The left ventricle has normal function. There is no left ventricular  hypertrophy.   2. Left ventricular diastolic parameters are consistent with Grade I  diastolic dysfunction (impaired relaxation).   3. The left ventricle has no regional wall motion abnormalities.   4. Global right ventricle has low normal systolic function.The right  ventricular size is normal. No increase in right ventricular wall  thickness.   5. Left atrial size was normal.   6. Right atrial size was mildly dilated.   7. Mild mitral annular calcification.   8. The mitral valve is degenerative. Mild mitral valve regurgitation.   9. The tricuspid valve is grossly normal. Tricuspid valve regurgitation  is mild.  10. The aortic valve is tricuspid. Aortic valve regurgitation is not  visualized. No evidence of aortic valve sclerosis or stenosis.  11. The pulmonic valve was grossly normal. Pulmonic valve regurgitation is  mild.  12. Aortic dilatation noted.  13. There is mild dilatation of the aortic root.  14. Normal pulmonary artery systolic pressure.  15. The inferior vena cava is normal in size with greater than 50%  respiratory  variability, suggesting right atrial pressure of 3 mmHg.     Neuro/Psych neg Seizures  Neuromuscular disease (peripheral neuropathy)    GI/Hepatic Neg liver ROS,GERD  ,,Pancreatic pseudocyst   Endo/Other  negative endocrine ROS    Renal/GU negative Renal ROS     Musculoskeletal   Abdominal   Peds  Hematology  (+) Blood dyscrasia, anemia   Anesthesia Other Findings Necrotizing pancreatitis (unclear if related to Lyrica or microlithiasis) complicated by pseudocyst formation and walled off necrosis status postcyst gastrostromy  Reproductive/Obstetrics                             Anesthesia Physical Anesthesia Plan  ASA: 3  Anesthesia Plan: General   Post-op Pain Management:    Induction: Intravenous  PONV Risk Score and Plan: 2 and Treatment may vary due to age or medical condition and Ondansetron  Airway Management Planned: Oral ETT  Additional Equipment:   Intra-op Plan:   Post-operative Plan:   Informed Consent: I have reviewed the patients History and Physical, chart, labs and discussed the procedure including the risks, benefits and alternatives for the proposed anesthesia with the patient or authorized representative who has indicated his/her understanding and acceptance.     Dental advisory given  Plan Discussed with: CRNA and Anesthesiologist  Anesthesia Plan Comments: (Risks of general anesthesia discussed including, but not limited to, sore throat, hoarse voice, chipped/damaged teeth, injury to vocal cords, nausea and vomiting, allergic reactions, lung infection, heart attack, stroke, and death. All questions answered.  )  Anesthesia Quick Evaluation  

## 2022-11-27 ENCOUNTER — Ambulatory Visit (HOSPITAL_COMMUNITY)
Admission: RE | Admit: 2022-11-27 | Discharge: 2022-11-27 | Disposition: A | Discharge status: Home / Self Care | Payer: PPO | Attending: Gastroenterology | Admitting: Gastroenterology

## 2022-11-27 ENCOUNTER — Encounter (HOSPITAL_COMMUNITY): Payer: Self-pay | Admitting: Gastroenterology

## 2022-11-27 ENCOUNTER — Ambulatory Visit (HOSPITAL_COMMUNITY): Payer: PPO | Admitting: Anesthesiology

## 2022-11-27 ENCOUNTER — Ambulatory Visit (HOSPITAL_BASED_OUTPATIENT_CLINIC_OR_DEPARTMENT_OTHER): Payer: PPO | Admitting: Anesthesiology

## 2022-11-27 ENCOUNTER — Other Ambulatory Visit: Payer: Self-pay

## 2022-11-27 ENCOUNTER — Encounter (HOSPITAL_COMMUNITY)
Admission: RE | Disposition: A | Discharge status: Home / Self Care | Payer: Self-pay | Source: Home / Self Care | Attending: Gastroenterology

## 2022-11-27 DIAGNOSIS — K3189 Other diseases of stomach and duodenum: Secondary | ICD-10-CM

## 2022-11-27 DIAGNOSIS — I2510 Atherosclerotic heart disease of native coronary artery without angina pectoris: Secondary | ICD-10-CM | POA: Insufficient documentation

## 2022-11-27 DIAGNOSIS — E785 Hyperlipidemia, unspecified: Secondary | ICD-10-CM | POA: Diagnosis not present

## 2022-11-27 DIAGNOSIS — Z8379 Family history of other diseases of the digestive system: Secondary | ICD-10-CM | POA: Diagnosis not present

## 2022-11-27 DIAGNOSIS — K862 Cyst of pancreas: Secondary | ICD-10-CM

## 2022-11-27 DIAGNOSIS — I10 Essential (primary) hypertension: Secondary | ICD-10-CM | POA: Diagnosis not present

## 2022-11-27 DIAGNOSIS — Z951 Presence of aortocoronary bypass graft: Secondary | ICD-10-CM | POA: Diagnosis not present

## 2022-11-27 DIAGNOSIS — K219 Gastro-esophageal reflux disease without esophagitis: Secondary | ICD-10-CM | POA: Diagnosis not present

## 2022-11-27 DIAGNOSIS — R748 Abnormal levels of other serum enzymes: Secondary | ICD-10-CM

## 2022-11-27 DIAGNOSIS — K2289 Other specified disease of esophagus: Secondary | ICD-10-CM

## 2022-11-27 DIAGNOSIS — K863 Pseudocyst of pancreas: Secondary | ICD-10-CM

## 2022-11-27 HISTORY — PX: CYST GASTROSTOMY: SHX6862

## 2022-11-27 HISTORY — PX: BIOPSY: SHX5522

## 2022-11-27 HISTORY — PX: EUS: SHX5427

## 2022-11-27 HISTORY — PX: ESOPHAGOGASTRODUODENOSCOPY (EGD) WITH PROPOFOL: SHX5813

## 2022-11-27 HISTORY — PX: BALLOON DILATION: SHX5330

## 2022-11-27 HISTORY — PX: PANCREATIC STENT PLACEMENT: SHX5539

## 2022-11-27 SURGERY — ESOPHAGOGASTRODUODENOSCOPY (EGD) WITH PROPOFOL
Anesthesia: General

## 2022-11-27 MED ORDER — EPHEDRINE SULFATE-NACL 50-0.9 MG/10ML-% IV SOSY
PREFILLED_SYRINGE | INTRAVENOUS | Status: DC | PRN
  Administered 2022-11-27: 10 mg via INTRAVENOUS

## 2022-11-27 MED ORDER — PHENYLEPHRINE 80 MCG/ML (10ML) SYRINGE FOR IV PUSH (FOR BLOOD PRESSURE SUPPORT)
PREFILLED_SYRINGE | INTRAVENOUS | Status: DC | PRN
  Administered 2022-11-27 (×3): 160 ug via INTRAVENOUS

## 2022-11-27 MED ORDER — ROCURONIUM BROMIDE 10 MG/ML (PF) SYRINGE
PREFILLED_SYRINGE | INTRAVENOUS | Status: DC | PRN
  Administered 2022-11-27: 40 mg via INTRAVENOUS

## 2022-11-27 MED ORDER — ONDANSETRON HCL 4 MG/2ML IJ SOLN
INTRAMUSCULAR | Status: DC | PRN
  Administered 2022-11-27: 4 mg via INTRAVENOUS

## 2022-11-27 MED ORDER — CIPROFLOXACIN IN D5W 400 MG/200ML IV SOLN
400.0000 mg | Freq: Once | INTRAVENOUS | Status: AC
  Administered 2022-11-27: 400 mg via INTRAVENOUS

## 2022-11-27 MED ORDER — FENTANYL CITRATE (PF) 100 MCG/2ML IJ SOLN: 25.0000 ug | INTRAMUSCULAR | Status: DC | PRN

## 2022-11-27 MED ORDER — SUGAMMADEX SODIUM 200 MG/2ML IV SOLN
INTRAVENOUS | Status: DC | PRN
  Administered 2022-11-27: 200 mg via INTRAVENOUS

## 2022-11-27 MED ORDER — PROPOFOL 10 MG/ML IV BOLUS
INTRAVENOUS | Status: AC
  Filled 2022-11-27: qty 20

## 2022-11-27 MED ORDER — PHENYLEPHRINE HCL (PRESSORS) 10 MG/ML IV SOLN
INTRAVENOUS | Status: AC
  Filled 2022-11-27: qty 1

## 2022-11-27 MED ORDER — SODIUM CHLORIDE 0.9 % IV SOLN: INTRAVENOUS | Status: DC

## 2022-11-27 MED ORDER — PHENYLEPHRINE HCL-NACL 20-0.9 MG/250ML-% IV SOLN
INTRAVENOUS | Status: DC | PRN
  Administered 2022-11-27: 50 ug/min via INTRAVENOUS

## 2022-11-27 MED ORDER — FENTANYL CITRATE (PF) 250 MCG/5ML IJ SOLN
INTRAMUSCULAR | Status: DC | PRN
  Administered 2022-11-27: 100 ug via INTRAVENOUS

## 2022-11-27 MED ORDER — CIPROFLOXACIN IN D5W 400 MG/200ML IV SOLN
INTRAVENOUS | Status: AC
  Filled 2022-11-27: qty 200

## 2022-11-27 MED ORDER — CIPROFLOXACIN HCL 500 MG PO TABS: 500.0000 mg | ORAL_TABLET | Freq: Two times a day (BID) | ORAL | 0 refills | Status: AC

## 2022-11-27 MED ORDER — PROMETHAZINE HCL 25 MG/ML IJ SOLN: 6.2500 mg | INTRAMUSCULAR | Status: DC | PRN

## 2022-11-27 MED ORDER — FENTANYL CITRATE (PF) 100 MCG/2ML IJ SOLN
INTRAMUSCULAR | Status: AC
  Filled 2022-11-27: qty 2

## 2022-11-27 MED ORDER — PROPOFOL 10 MG/ML IV BOLUS
INTRAVENOUS | Status: DC | PRN
  Administered 2022-11-27: 150 mg via INTRAVENOUS

## 2022-11-27 MED ORDER — LIDOCAINE 2% (20 MG/ML) 5 ML SYRINGE
INTRAMUSCULAR | Status: DC | PRN
  Administered 2022-11-27: 100 mg via INTRAVENOUS

## 2022-11-27 MED ORDER — LACTATED RINGERS IV SOLN: INTRAVENOUS | Status: DC

## 2022-11-27 NOTE — Anesthesia Postprocedure Evaluation (Signed)
Anesthesia Post Note  Patient: [name]  Procedure(s) Performed: ESOPHAGOGASTRODUODENOSCOPY (EGD) WITH PROPOFOL BIOPSY UPPER ENDOSCOPIC ULTRASOUND (EUS) LINEAR CYST GASTROSTOMY BALLOON DILATION PANCREATIC STENT PLACEMENT     Patient location during evaluation: PACU Anesthesia Type: General Level of consciousness: awake and alert, oriented and patient cooperative Pain management: pain level controlled Vital Signs Assessment: post-procedure vital signs reviewed and stable Respiratory status: spontaneous breathing, nonlabored ventilation and respiratory function stable Cardiovascular status: blood pressure returned to baseline and stable Postop Assessment: no apparent nausea or vomiting Anesthetic complications: no   No notable events documented.  Last Vitals:  Vitals:   11/27/22 1617 11/27/22 1620  BP: 135/60 132/62  Pulse: 78 80  Resp: 11 15  Temp: 36.4 C   SpO2: 100% 100%    Last Pain:  Vitals:   11/27/22 1617  TempSrc: Temporal  PainSc:                  [name]

## 2022-11-27 NOTE — H&P (Signed)
GASTROENTEROLOGY PROCEDURE H&P NOTE   Primary Care Physician: Asencion Noble, MD  HPI: [name] is a 71 y.o. male who presents for EGD/EUS for attempt at pseudocyst gastrostomy creation in setting of recurrent pancreatic cyst.  This has been imaged on ultrasound and then subsequently with MRI/MRCP.  No overt pancreatic duct disruption has been noted but this seems to be in a similar location as to his prior cyst that I drained in early 2023 so I have concerned that there may be some issues with this having recurred without having recurrent true pancreatitis develop.  Time will tell.  Holding off on pancreatic ERCP for now.  Past Medical History:  Diagnosis Date   Colon polyps    benign per pt   Coronary artery disease    a. s/p CABG in 2014 with LIMA-LAD and SVG-D1 - performed in [place]   Fracture, ribs    Hyperlipidemia    Hypertension    Pancreatitis    Peripheral neuropathy    Past Surgical History:  Procedure Laterality Date   BALLOON DILATION N/A 12/09/2021   Procedure: BALLOON DILATION;  Surgeon: Mansouraty, Telford Nab., MD;  Location: [facility];  Service: Gastroenterology;  Laterality: N/A;   BILIARY STENT PLACEMENT N/A 12/09/2021   Procedure: BILIARY STENT PLACEMENT;  Surgeon: Rush Landmark Telford Nab., MD;  Location: [facility];  Service: Gastroenterology;  Laterality: N/A;  double pigtail stents x 2   BIOPSY  12/09/2021   Procedure: BIOPSY;  Surgeon: Rush Landmark Telford Nab., MD;  Location: [facility];  Service: Gastroenterology;;   BIOPSY  01/20/2022   Procedure: BIOPSY;  Surgeon: Irving Copas., MD;  Location: [facility];  Service: Gastroenterology;;   CATARACT EXTRACTION W/PHACO Right 02/15/2021   Procedure: CATARACT EXTRACTION PHACO AND INTRAOCULAR LENS PLACEMENT ([place]);  Surgeon: Baruch Goldmann, MD;  Location: [facility];  Service: Ophthalmology;  Laterality: Right;  CDE 8.65   CATARACT EXTRACTION W/PHACO Left 03/04/2021   Procedure: CATARACT EXTRACTION PHACO  AND INTRAOCULAR LENS PLACEMENT ([facility]);  Surgeon: Baruch Goldmann, MD;  Location: [facility];  Service: Ophthalmology;  Laterality: Left;  CDE: 8.15   COLONOSCOPY N/A 03/11/2017   [name]: 2 sessile serrated polyps removed, next colonoscopy in 5 years   COLONOSCOPY WITH PROPOFOL N/A 04/24/2022   Procedure: COLONOSCOPY WITH PROPOFOL;  Surgeon: Rogene Houston, MD;  [facility]: [facility];  Service: Endoscopy;  Laterality: N/A;  1250   CORONARY ARTERY BYPASS GRAFT  2014   DUODENAL STENT PLACEMENT N/A 12/09/2021   Procedure: AXIOS STENT PLACEMENT;  Surgeon: Irving Copas., MD;  Location: [facility];  Service: Gastroenterology;  Laterality: N/A;   ESOPHAGOGASTRODUODENOSCOPY N/A 12/09/2021   Procedure: ESOPHAGOGASTRODUODENOSCOPY (EGD);  Surgeon: Irving Copas., MD;  Location: [facility];  Service: Gastroenterology;  Laterality: N/A;   ESOPHAGOGASTRODUODENOSCOPY (EGD) WITH PROPOFOL N/A 01/20/2022   Procedure: ESOPHAGOGASTRODUODENOSCOPY (EGD) WITH PROPOFOL;  Surgeon: Rush Landmark Telford Nab., MD;  Location: [facility];  Service: Gastroenterology;  Laterality: N/A;   HERNIA REPAIR Left 2010   inguinal   POLYPECTOMY  03/11/2017   Procedure: POLYPECTOMY;  Surgeon: Rogene Houston, MD;  [facility];  Service: Endoscopy;;  colon   POLYPECTOMY  04/24/2022   Procedure: POLYPECTOMY;  Surgeon: Rogene Houston, MD;  [facility];  Service: Endoscopy;;   ROTATOR CUFF REPAIR Right 2014   STENT REMOVAL  01/20/2022   Procedure: STENT REMOVAL;  Surgeon: Irving Copas., MD;  Location: [facility];  Service: Gastroenterology;;   UPPER ESOPHAGEAL ENDOSCOPIC ULTRASOUND (EUS) N/A 12/09/2021  Procedure: UPPER ESOPHAGEAL ENDOSCOPIC ULTRASOUND (EUS);  Surgeon: Irving Copas., MD;  Location: [facility];  Service: Gastroenterology;  Laterality: N/A;   vericous veins  2001   Current Facility-Administered Medications  Medication Dose Route Frequency Provider Last Rate  Last Admin   0.9 %  sodium chloride infusion   Intravenous Continuous Mansouraty, Telford Nab., MD       fentaNYL (SUBLIMAZE) injection 25-50 mcg  25-50 mcg Intravenous Q5 min PRN Nilda Simmer, MD       lactated ringers infusion   Intravenous Continuous Mansouraty, Telford Nab., MD 10 mL/hr at 11/27/22 1442 New Bag at 11/27/22 1442   promethazine (PHENERGAN) injection 6.25-12.5 mg  6.25-12.5 mg Intravenous Q15 min PRN Nilda Simmer, MD        Current Facility-Administered Medications:    0.9 %  sodium chloride infusion, , Intravenous, Continuous, Mansouraty, Telford Nab., MD   fentaNYL (SUBLIMAZE) injection 25-50 mcg, 25-50 mcg, Intravenous, Q5 min PRN, Nilda Simmer, MD   lactated ringers infusion, , Intravenous, Continuous, Mansouraty, Telford Nab., MD, Last Rate: 10 mL/hr at 11/27/22 1442, New Bag at 11/27/22 1442   promethazine (PHENERGAN) injection 6.25-12.5 mg, 6.25-12.5 mg, Intravenous, Q15 min PRN, Nilda Simmer, MD No Known Allergies Family History  Problem Relation Age of Onset   Stroke Mother    Heart disease Father    Clotting disorder Father    Dementia Sister    Thyroid disease Sister    Hypertension Sister    Colon cancer Neg Hx    Esophageal cancer Neg Hx    Rectal cancer Neg Hx    Inflammatory bowel disease Neg Hx    Liver disease Neg Hx    Pancreatic cancer Neg Hx    Stomach cancer Neg Hx    Social History   Socioeconomic History   Marital status: Divorced    Spouse name: Not on file   Number of children: 2   Years of education: 16   Highest education level: Not on file  Occupational History    Comment: retired  Tobacco Use   Smoking status: Never   Smokeless tobacco: Never  Vaping Use   Vaping Use: Never used  Substance and Sexual Activity   Alcohol use: Not Currently   Drug use: No   Sexual activity: Not on file  Other Topics Concern   Not on file  Social History Narrative   Lives with sig other    caffeine-  A little   Social Determinants of Health   Financial Resource Strain: Not on file  Food Insecurity: Not on file  Transportation Needs: Not on file  Physical Activity: Not on file  Stress: Not on file  Social Connections: Not on file  Intimate Partner Violence: Not on file    Physical Exam: Today's Vitals   11/27/22 1435  BP: 127/77  Pulse: 69  Resp: 15  Temp: 98 F (36.7 C)  TempSrc: Temporal  SpO2: 100%  Weight: 77.1 kg  Height: '5\' 8"'$  (1.727 m)  PainSc: 0-No pain   Body mass index is 25.85 kg/m. GEN: NAD EYE: Sclerae anicteric ENT: MMM CV: Non-tachycardic GI: Soft, NT/ND NEURO:  Alert & Oriented x 3  Lab Results: No results for input(s): "WBC", "HGB", "HCT", "PLT" in the last 72 hours. BMET No results for input(s): "NA", "K", "CL", "CO2", "GLUCOSE", "BUN", "CREATININE", "CALCIUM" in the last 72 hours. LFT No results for input(s): "PROT", "ALBUMIN", "AST", "ALT", "ALKPHOS", "BILITOT", "BILIDIR", "IBILI" in the last 72 hours.  PT/INR No results for input(s): "LABPROT", "INR" in the last 72 hours.   Impression / Plan: This is a 71 y.o.male who presents for EGD/EUS for attempt at pseudocyst gastrostomy creation in setting of recurrent pancreatic cyst.  This has been imaged on ultrasound and then subsequently with MRI/MRCP.  No overt pancreatic duct disruption has been noted but this seems to be in a similar location as to his prior cyst that I drained in early 2023 so I have concerned that there may be some issues with this having recurred without having recurrent true pancreatitis develop.  Time will tell.  Holding off on pancreatic ERCP for now.  The risks of an EUS, including intestinal perforation, bleeding, infection, aspiration, and medication effects were discussed.  When a cystgastrostomy/cystenterostomy is performed as part of the EUS, there is an additional risk of pancreatitis at the rate of about 1-2%.  It was explained that procedure related  pancreatitis is typically mild, although at times it can be severe and even life threatening.   The risks and benefits of endoscopic evaluation/treatment were discussed with the patient and/or family; these include but are not limited to the risk of perforation, infection, bleeding, missed lesions, lack of diagnosis, severe illness requiring hospitalization, as well as anesthesia and sedation related illnesses.  The patient's history has been reviewed, patient examined, no change in status, and deemed stable for procedure.  The patient and/or family is agreeable to proceed.    Justice Britain, MD [facility] Office # [phone]

## 2022-11-27 NOTE — Anesthesia Procedure Notes (Signed)
Procedure Name: Intubation Date/Time: 11/27/2022 3:19 PM  Performed by: Lollie Sails, CRNAPre-anesthesia Checklist: Patient identified, Emergency Drugs available, Suction available, Patient being monitored and Timeout performed Patient Re-evaluated:Patient Re-evaluated prior to induction Oxygen Delivery Method: Circle system utilized Preoxygenation: Pre-oxygenation with 100% oxygen Induction Type: IV induction Ventilation: Mask ventilation without difficulty Laryngoscope Size: Miller and 3 Grade View: Grade II Tube type: Oral Tube size: 7.5 mm Number of attempts: 1 Airway Equipment and Method: Stylet Placement Confirmation: ETT inserted through vocal cords under direct vision, positive ETCO2 and breath sounds checked- equal and bilateral Secured at: 24 cm Tube secured with: Tape Dental Injury: Teeth and Oropharynx as per pre-operative assessment

## 2022-11-27 NOTE — Discharge Instructions (Signed)
YOU HAD AN ENDOSCOPIC PROCEDURE TODAY: Refer to the procedure report and other information in the discharge instructions given to you for any specific questions about what was found during the examination. If this information does not answer your questions, please call [place] office at [phone] to clarify.  ° °YOU SHOULD EXPECT: Some feelings of bloating in the abdomen. Passage of more gas than usual. Walking can help get rid of the air that was put into your GI tract during the procedure and reduce the bloating. If you had a lower endoscopy (such as a colonoscopy or flexible sigmoidoscopy) you may notice spotting of blood in your stool or on the toilet paper. Some abdominal soreness may be present for a day or two, also. ° °DIET: Your first meal following the procedure should be a light meal and then it is ok to progress to your normal diet. A half-sandwich or bowl of soup is an example of a good first meal. Heavy or fried foods are harder to digest and may make you feel nauseous or bloated. Drink plenty of fluids but you should avoid alcoholic beverages for 24 hours. If you had a esophageal dilation, please see attached instructions for diet.   ° °ACTIVITY: Your care partner should take you home directly after the procedure. You should plan to take it easy, moving slowly for the rest of the day. You can resume normal activity the day after the procedure however YOU SHOULD NOT DRIVE, use power tools, machinery or perform tasks that involve climbing or major physical exertion for 24 hours (because of the sedation medicines used during the test).  ° °SYMPTOMS TO REPORT IMMEDIATELY: °A gastroenterologist can be reached at any hour. Please call [phone]  for any of the following symptoms:  °Following lower endoscopy (colonoscopy, flexible sigmoidoscopy) °Excessive amounts of blood in the stool  °Significant tenderness, worsening of abdominal pains  °Swelling of the abdomen that is new, acute  °Fever of 100° or  higher  °Following upper endoscopy (EGD, EUS, ERCP, esophageal dilation) °Vomiting of blood or coffee ground material  °New, significant abdominal pain  °New, significant chest pain or pain under the shoulder blades  °Painful or persistently difficult swallowing  °New shortness of breath  °Black, tarry-looking or red, bloody stools ° °FOLLOW UP:  °If any biopsies were taken you will be contacted by phone or by letter within the next 1-3 weeks. Call [phone]  if you have not heard about the biopsies in 3 weeks.  °Please also call with any specific questions about appointments or follow up tests. ° °

## 2022-11-27 NOTE — Op Note (Signed)
[facility] Patient Name: [name] Procedure Date: 11/27/2022 MRN: [id number] Attending MD: Justice Britain , MD, [id number] Date of Birth: 04/13/1951 CSN: [id number] Age: 71 Admit Type: Outpatient Procedure:                Upper EUS Indications:              Pancreatic cyst on MRCP Providers:                Justice Britain, MD, Carlyn Reichert, RN, Darliss Cheney, Technician, Climax Zenia Resides CRNA, CRNA Referring MD:             Maylon Peppers, Asencion Noble Medicines:                General Anesthesia, Cipro 233 mg IV Complications:            No immediate complications. Estimated Blood Loss:     Estimated blood loss was minimal. Procedure:                Pre-Anesthesia Assessment:                           - Prior to the procedure, a History and Physical                            was performed, and patient medications and                            allergies were reviewed. The patient's tolerance of                            previous anesthesia was also reviewed. The risks                            and benefits of the procedure and the sedation                            options and risks were discussed with the patient.                            All questions were answered, and informed consent                            was obtained. Prior Anticoagulants: The patient has                            taken no anticoagulant or antiplatelet agents. ASA                            Grade Assessment: II - A patient with mild systemic                            disease. After reviewing the risks and benefits,  the patient was deemed in satisfactory condition to                            undergo the procedure.                           After obtaining informed consent, the endoscope was                            passed under direct vision. Throughout the                            procedure, the patient's blood  pressure, pulse, and                            oxygen saturations were monitored continuously. The                            [id number] ([id number]) Olympus therapeutic endoscope                            was introduced through the mouth, and advanced to                            the second part of duodenum. The [id number]                            ([id number]) Olympus duodenoscope was introduced                            through the mouth, and advanced to the area of                            papilla. The [id number] ([id number]) Olympus linear                            ultrasound scope was introduced through the mouth,                            and advanced to the duodenum for ultrasound                            examination from the stomach and duodenum. The                            upper EUS was accomplished without difficulty. The                            patient tolerated the procedure. Scope In: Scope Out: Findings:      ENDOSCOPIC FINDING: :      No gross lesions were noted in the entire esophagus.      The Z-line was irregular and was found 40 cm from the incisors.      Extrinsic compression/impression on the stomach was found in the gastric  body/antrum.      No other gross lesions were noted in the entire examined stomach.      Patchy moderately congested mucosa without active bleeding and with no       stigmata of bleeding was found in the duodenal bulb, in the first       portion of the duodenum and in the second portion of the duodenum.      Patchy moderately erythematous mucosa without active bleeding and with       no stigmata of bleeding was found in the duodenal bulb, in the first       portion of the duodenum and in the second portion of the duodenum.       Biopsies were taken with a cold forceps for histology and Helicobacter       pylori testing.      Mildly congested mucosa without active bleeding and with no stigmata of       bleeding was found at the  major papilla.      ENDOSONOGRAPHIC FINDING: :      An anechoic lesion suggestive of a cyst was identified in the pancreatic       head, genu of the pancreas and pancreatic body. It is not in obvious       communication with the pancreatic duct. The lesion measured 110 mm by 96       mm in maximal cross-sectional diameter. There was a single compartment       without septae. The outer wall of the lesion was not seen. There was no       associated mass. There was no internal debris within the fluid-filled       cavity. As this has the appearance of a recurrent pseudocyst (in similar       region to the one I drained in 2022, the decision was made to create a       cystogastrostomy using the AXIOS stent system. Once an appropriate       position in the stomach was identified, the common wall between the       stomach and the cyst was interrogated utilizing color Doppler imaging to       identify interposed vessels. The AXIOS stent and electrocautery device       were introduced through the working channel. The AXIOS catheter was       advanced to the common wall between the stomach and the cyst. Current       was applied to the cautery tip and then the catheter was advanced into       the cyst. A 15 x 10 mm AXIOS stent was placed with the flanges in close       approximation to the walls of the cyst and the stomach through the       cystogastrostomy. The stent was successfully placed. Suction via       Endoscope was performed and we removed 500 cc of fluid (some will be       sent for culture). A TTS dilator was passed through the scope. Dilation       with a 10-02-11 mm anastomotic balloon dilator was performed up to 12 mm       through the AXIOS to aid in expansion. Then a 5 cm 10 Fr double pigtail       stent was placed into the pseudocyst through the cystogastrostomy using       a stent introducer  set. The stent was successfully placed. Impression:               EGD Impression:                            - No gross lesions in the entire esophagus. Z-line                            irregular, 40 cm from the incisors.                           - Extrinsic impression of the gastric body/antrum.                           - No other gross lesions in the entire stomach.                           - Congested duodenal mucosa.                           - Erythematous and congested duodenopathy. Biopsied.                           EUS Impression:                           - A cystic lesion was seen in the pancreatic                            head/genu/body. Tissue has not been obtained.                            However, the endosonographic appearance is                            consistent with a pancreatic pseudocyst. AXIOS                            cystgastrostomy created. Fluid evacuated via                            suction. Dilated AXIOS tract. Single double pigtail                            placed into the pseudocyst cavity through the AXIOS. Moderate Sedation:      Not Applicable - Patient had care per Anesthesia. Recommendation:           - The patient will be observed post-procedure,                            until all discharge criteria are met.                           - Discharge patient to home.                           -  Patient has a contact number available for                            emergencies. The signs and symptoms of potential                            delayed complications were discussed with the                            patient. Return to normal activities tomorrow.                            Written discharge instructions were provided to the                            patient.                           - Full liquid diet today. Advance to fulls tomorrow                            if doing well.                           - Observe patient's clinical course.                           - Monitor for signs/symptoms of bleeding,                             perforation, and infection. If issues please call                            our number to get further assistance as needed.                           - Cipro (ciprofloxacin) 500 mg PO BID for 1 week.                           - Perform CT scan (computed tomography) of the                            abdomen with contrast in 4 weeks to ensure cyst                            decompression.                           - Will plan for AXIOS/pigtail stent pull +/-                            Pancreatic ERCP based on conversations with the                            patient (will set up  a follow up in clinic around                            time of CT scan being completed.                           - The findings and recommendations were discussed                            with the patient.                           - The findings and recommendations were discussed                            with the designated responsible adult. Procedure Code(s):        --- Professional ---                           [phone], Esophagogastroduodenoscopy, flexible,                            transoral; with transmural drainage of pseudocyst                            (includes placement of transmural drainage                            catheter[s]/stent[s], when performed, and                            endoscopic ultrasound, when performed) Diagnosis Code(s):        --- Professional ---                           K22.89, Other specified disease of esophagus                           K31.89, Other diseases of stomach and duodenum                           K86.2, Cyst of pancreas CPT copyright 2022 American Medical Association. All rights reserved. The codes documented in this report are preliminary and upon coder review may  be revised to meet current compliance requirements. Justice Britain, MD 11/27/2022 4:13:36 PM Number of Addenda: 0

## 2022-11-27 NOTE — Transfer of Care (Signed)
Immediate Anesthesia Transfer of Care Note  Patient: [name]  Procedure(s) Performed: ESOPHAGOGASTRODUODENOSCOPY (EGD) WITH PROPOFOL BIOPSY UPPER ENDOSCOPIC ULTRASOUND (EUS) LINEAR CYST GASTROSTOMY BALLOON DILATION PANCREATIC STENT PLACEMENT  Patient Location: PACU and Endoscopy Unit  Anesthesia Type:General  Level of Consciousness: awake, alert , and oriented  Airway & Oxygen Therapy: Patient Spontanous Breathing and Patient connected to face mask oxygen  Post-op Assessment: Report given to RN and Post -op Vital signs reviewed and stable  Post vital signs: Reviewed and stable  Last Vitals:  Vitals Value Taken Time  BP 135/60 11/27/22 1616  Temp    Pulse 79 11/27/22 1619  Resp 20 11/27/22 1619  SpO2 100 % 11/27/22 1619  Vitals shown include unvalidated device data.  Last Pain:  Vitals:   11/27/22 1435  TempSrc: Temporal  PainSc: 0-No pain         Complications: No notable events documented.

## 2022-11-28 ENCOUNTER — Telehealth: Payer: Self-pay | Admitting: Gastroenterology

## 2022-11-28 ENCOUNTER — Encounter (HOSPITAL_COMMUNITY): Payer: Self-pay | Admitting: Emergency Medicine

## 2022-11-28 ENCOUNTER — Emergency Department (HOSPITAL_COMMUNITY): Payer: PPO

## 2022-11-28 ENCOUNTER — Emergency Department (HOSPITAL_COMMUNITY)
Admission: EM | Admit: 2022-11-28 | Discharge: 2022-11-28 | Disposition: A | Discharge status: Home / Self Care | Payer: PPO | Attending: Emergency Medicine | Admitting: Emergency Medicine

## 2022-11-28 DIAGNOSIS — R5082 Postprocedural fever: Secondary | ICD-10-CM | POA: Diagnosis not present

## 2022-11-28 DIAGNOSIS — R509 Fever, unspecified: Secondary | ICD-10-CM | POA: Diagnosis not present

## 2022-11-28 DIAGNOSIS — R000 Tachycardia, unspecified: Secondary | ICD-10-CM | POA: Diagnosis not present

## 2022-11-28 DIAGNOSIS — A419 Sepsis, unspecified organism: Secondary | ICD-10-CM | POA: Diagnosis not present

## 2022-11-28 LAB — CBC WITH DIFFERENTIAL/PLATELET
Abs Immature Granulocytes: 0.05 10*3/uL (ref 0.00–0.07)
Basophils Absolute: 0.1 10*3/uL (ref 0.0–0.1)
Basophils Relative: 0 %
Eosinophils Absolute: 0.1 10*3/uL (ref 0.0–0.5)
Eosinophils Relative: 1 %
HCT: 44.8 % (ref 39.0–52.0)
Hemoglobin: 15 g/dL (ref 13.0–17.0)
Immature Granulocytes: 0 %
Lymphocytes Relative: 7 %
Lymphs Abs: 1 10*3/uL (ref 0.7–4.0)
MCH: 27.6 pg (ref 26.0–34.0)
MCHC: 33.5 g/dL (ref 30.0–36.0)
MCV: 82.4 fL (ref 80.0–100.0)
Monocytes Absolute: 1.3 10*3/uL — ABNORMAL HIGH (ref 0.1–1.0)
Monocytes Relative: 9 %
Neutro Abs: 12.7 10*3/uL — ABNORMAL HIGH (ref 1.7–7.7)
Neutrophils Relative %: 83 %
Platelets: 195 10*3/uL (ref 150–400)
RBC: 5.44 MIL/uL (ref 4.22–5.81)
RDW: 14.2 % (ref 11.5–15.5)
WBC: 15.2 10*3/uL — ABNORMAL HIGH (ref 4.0–10.5)
nRBC: 0 % (ref 0.0–0.2)

## 2022-11-28 LAB — COMPREHENSIVE METABOLIC PANEL
ALT: 23 U/L (ref 0–44)
AST: 25 U/L (ref 15–41)
Albumin: 4.3 g/dL (ref 3.5–5.0)
Alkaline Phosphatase: 80 U/L (ref 38–126)
Anion gap: 12 (ref 5–15)
BUN: 18 mg/dL (ref 8–23)
CO2: 22 mmol/L (ref 22–32)
Calcium: 9.2 mg/dL (ref 8.9–10.3)
Chloride: 101 mmol/L (ref 98–111)
Creatinine, Ser: 1.09 mg/dL (ref 0.61–1.24)
GFR, Estimated: >60 mL/min (ref >60)
Glucose, Bld: 142 mg/dL — ABNORMAL HIGH (ref 70–99)
Potassium: 4 mmol/L (ref 3.5–5.1)
Sodium: 135 mmol/L (ref 135–145)
Total Bilirubin: 1.5 mg/dL — ABNORMAL HIGH (ref 0.3–1.2)
Total Protein: 7.8 g/dL (ref 6.5–8.1)

## 2022-11-28 LAB — URINALYSIS, ROUTINE W REFLEX MICROSCOPIC
Bacteria, UA: NONE SEEN
Bilirubin Urine: NEGATIVE
Glucose, UA: NEGATIVE mg/dL
Hgb urine dipstick: NEGATIVE
Ketones, ur: NEGATIVE mg/dL
Leukocytes,Ua: NEGATIVE
Nitrite: NEGATIVE
Protein, ur: 30 mg/dL — AB
Specific Gravity, Urine: 1.018 (ref 1.005–1.030)
pH: 8 (ref 5.0–8.0)

## 2022-11-28 LAB — APTT: aPTT: 28 seconds (ref 24–36)

## 2022-11-28 LAB — LACTIC ACID, PLASMA
Lactic Acid, Venous: 1.9 mmol/L (ref 0.5–1.9)
Lactic Acid, Venous: 1.3 mmol/L (ref 0.5–1.9)

## 2022-11-28 LAB — LIPASE, BLOOD: Lipase: 26 U/L (ref 11–51)

## 2022-11-28 LAB — PROTIME-INR
INR: 1.1 (ref 0.8–1.2)
Prothrombin Time: 14.2 seconds (ref 11.4–15.2)

## 2022-11-28 MED ORDER — SODIUM CHLORIDE 0.9 % IV BOLUS
1000.0000 mL | Freq: Once | INTRAVENOUS | Status: AC
  Administered 2022-11-28: 1000 mL via INTRAVENOUS

## 2022-11-28 MED ORDER — CIPROFLOXACIN HCL 250 MG PO TABS
500.0000 mg | ORAL_TABLET | Freq: Once | ORAL | Status: AC
  Administered 2022-11-28: 500 mg via ORAL
  Filled 2022-11-28: qty 2

## 2022-11-28 MED ORDER — ACETAMINOPHEN 500 MG PO TABS
1000.0000 mg | ORAL_TABLET | Freq: Once | ORAL | Status: AC
  Administered 2022-11-28: 1000 mg via ORAL
  Filled 2022-11-28: qty 2

## 2022-11-28 NOTE — Telephone Encounter (Signed)
Left message on machine to call back  

## 2022-11-28 NOTE — Discharge Instructions (Signed)
You were seen today for fever following a procedure.  Continue antibiotics as instructed.  Follow-up closely with your GI doctor.  If you develop worsening pain, refractory fevers, any new or worsening symptoms, you should be reevaluated.

## 2022-11-28 NOTE — ED Notes (Signed)
Pt notified this RN that it "burns when I last urinated". EDP made aware

## 2022-11-28 NOTE — ED Provider Notes (Signed)
[facility] EMERGENCY DEPARTMENT Provider Note   CSN: [id number] Arrival date & time: 11/28/22  0252     History  Chief Complaint  Patient presents with   Post-op Problem    Lejend Dalby is a 71 y.o. male.  HPI     This is a 71 year old male with a history of pancreatic pseudocyst who presents with fever.  Patient had a procedure yesterday by Dr. Rush Landmark for pancreatic stent placement.  He states he was due to start antibiotics today.  Around 11 PM he noted chills.  He reports fever up to 102.  He has had some urinary symptoms.  He reports some mild epigastric discomfort.  No nausea or vomiting.  No chest pain or shortness of breath.  No other infectious symptoms.  Home Medications Prior to Admission medications   Medication Sig Start Date End Date Taking? Authorizing Provider  acetaminophen (TYLENOL) 500 MG tablet Take 1,000 mg by mouth every 6 (six) hours as needed for moderate pain or headache.    [provider]  aspirin 81 MG tablet Take 1 tablet (81 mg total) by mouth daily. 04/25/22   [name], Mechele Dawley, MD  ciprofloxacin (CIPRO) 500 MG tablet Take 1 tablet (500 mg total) by mouth 2 (two) times daily for 7 days. 11/27/22 12/04/22  Mansouraty, Telford Nab., MD  irbesartan (AVAPRO) 75 MG tablet Take 37.5 mg by mouth daily.    [provider]  Multiple Vitamins-Minerals (MULTIVITAMIN WITH MINERALS) tablet Take 1 tablet by mouth daily.    [provider]  pantoprazole (PROTONIX) 40 MG tablet Take one tablet daily Patient taking differently: Take 40 mg by mouth every other day. 12/18/21   Rogene Houston, MD  REPATHA SURECLICK 637 MG/ML SOAJ INJECT 1 DOSE INTO THE SKIN EVERY 14 DAYS 10/01/22   Hilty, Nadean Corwin, MD      Allergies    Patient has no known allergies.    Review of Systems   Review of Systems  Gastrointestinal:  Positive for abdominal pain.  Genitourinary:  Positive for dysuria.  All other systems reviewed and are  negative.   Physical Exam Updated Vital Signs BP 119/69   Pulse (!) 109   Temp 99.9 F (37.7 C) (Oral)   Resp (!) 22   Ht 1.727 m ('5\' 8"'$ )   Wt 77.1 kg   SpO2 93%   BMI 25.85 kg/m  Physical Exam Vitals and nursing note reviewed.  Constitutional:      Appearance: He is well-developed. He is not ill-appearing.  HENT:     Head: Normocephalic and atraumatic.  Eyes:     Pupils: Pupils are equal, round, and reactive to light.  Cardiovascular:     Rate and Rhythm: Normal rate and regular rhythm.     Heart sounds: Normal heart sounds. No murmur heard. Pulmonary:     Effort: Pulmonary effort is normal. No respiratory distress.     Breath sounds: Normal breath sounds. No wheezing.  Abdominal:     General: Bowel sounds are normal.     Palpations: Abdomen is soft.     Tenderness: There is abdominal tenderness. There is no guarding or rebound.  Musculoskeletal:     Cervical back: Neck supple.     Right lower leg: No edema.     Left lower leg: No edema.  Lymphadenopathy:     Cervical: No cervical adenopathy.  Skin:    General: Skin is warm and dry.  Neurological:     Mental Status: He  is alert and oriented to person, place, and time.  Psychiatric:        Mood and Affect: Mood normal.     ED Results / Procedures / Treatments   Labs (all labs ordered are listed, but only abnormal results are displayed) Labs Reviewed  CBC WITH DIFFERENTIAL/PLATELET - Abnormal; Notable for the following components:      Result Value   WBC 15.2 (*)    Neutro Abs 12.7 (*)    Monocytes Absolute 1.3 (*)    All other components within normal limits  COMPREHENSIVE METABOLIC PANEL - Abnormal; Notable for the following components:   Glucose, Bld 142 (*)    Total Bilirubin 1.5 (*)    All other components within normal limits  URINALYSIS, ROUTINE W REFLEX MICROSCOPIC - Abnormal; Notable for the following components:   Protein, ur 30 (*)    All other components within normal limits  CULTURE, BLOOD  (ROUTINE X 2)  CULTURE, BLOOD (ROUTINE X 2)  URINE CULTURE  LIPASE, BLOOD  LACTIC ACID, PLASMA  LACTIC ACID, PLASMA  PROTIME-INR  APTT    EKG EKG Interpretation  Date/Time:  Friday November 28 2022 03:34:22 EST Ventricular Rate:  117 PR Interval:  181 QRS Duration: 76 QT Interval:  311 QTC Calculation: 434 R Axis:   -41 Text Interpretation: Sinus tachycardia Inferior infarct, old Anterior infarct, old Confirmed by Thayer Jew [phone]) on 11/28/2022 4:47:40 AM  Radiology DG Chest Port 1 View  Result Date: 11/28/2022 CLINICAL DATA:  Sepsis EXAM: PORTABLE CHEST 1 VIEW COMPARISON:  08/15/2021 FINDINGS: Lungs are clear. No pneumothorax or pleural effusion. Coronary artery bypass grafting has been performed. Cardiac size within normal limits. The pulmonary vascularity is normal. No acute bone abnormality. Suture anchors noted within the right humeral head. IMPRESSION: 1. No active disease. Electronically Signed   By: Fidela Salisbury M.D.   On: 11/28/2022 04:01    Procedures Procedures    Medications Ordered in ED Medications  sodium chloride 0.9 % bolus 1,000 mL (0 mLs Intravenous Stopped 11/28/22 0457)  acetaminophen (TYLENOL) tablet 1,000 mg (1,000 mg Oral Given 11/28/22 0547)  ciprofloxacin (CIPRO) tablet 500 mg (500 mg Oral Given 11/28/22 2585)    ED Course/ Medical Decision Making/ A&P Clinical Course as of 11/28/22 0640  Fri Nov 28, 2022  0603 Spoke with the patient and his wife regarding imaging.  They would like to defer imaging at this time.  He is fairly comfortable.  Reports 2 out of 10 pain.  He has no signs of peritonitis.  Will go ahead and initiate Cipro as originally planned postprocedure.  Recommend that they call GI later today. [CH]  2778 Took with Dr. Lyndel Safe, [facility].  Given overall reassuring workup he does not feel imaging is necessary.  [CH]    Clinical Course User Index [CH] Kapri Nero, Barbette Hair, MD                           Medical Decision  Making Amount and/or Complexity of Data Reviewed Labs: ordered. Radiology: ordered. ECG/medicine tests: ordered.  Risk OTC drugs. Prescription drug management.   This patient presents to the ED for concern of postprocedural fever, this involves an extensive number of treatment options, and is a complaint that carries with it a high risk of complications and morbidity.  I considered the following differential and admission for this acute, potentially life threatening condition.  The differential diagnosis includes atelectasis, UTI, pneumonia, fever related  to instrumentation, cholangitis  MDM:    This is a 71 year old male who presents with fever.  He had a pancreatic stent placed yesterday.  He is overall nontoxic-appearing.  He is tachycardic and febrile.  Initially blood pressure 95/70.  However, repeat blood pressures are in the 110s to 120/60 range.  I gave him a liter of fluid and Tylenol.  Lactate is normal.  He has a slight leukocytosis to 15 with a left shift.  Chest x-ray and urine are reassuring.  LFTs are largely reassuring.  He does have a slight bump in his total bilirubin of 1.5.  Lipase is normal.  He is feeling overall better.  Will start Cipro.  Discussed with gastroenterology.  Do not feel advanced imaging is warranted at this time.  Start antibiotics follow-up with Dr. Rush Landmark.  (Labs, imaging, consults)  Labs: I Ordered, and personally interpreted labs.  The pertinent results include: CBC, CMP, lipase and urinalysis  Imaging Studies ordered: I ordered imaging studies including chest x-ray I independently visualized and interpreted imaging. I agree with the radiologist interpretation  Additional history obtained from wife at bedside.  External records from outside source obtained and reviewed including procedure notes from yesterday  Cardiac Monitoring: The patient was maintained on a cardiac monitor.  I personally viewed and interpreted the cardiac monitored which  showed an underlying rhythm of: Sinus rhythm  Reevaluation: After the interventions noted above, I reevaluated the patient and found that they have :improved  Social Determinants of Health:  lives independently  Disposition: Discharge  Co morbidities that complicate the patient evaluation  Past Medical History:  Diagnosis Date   Colon polyps    benign per pt   Coronary artery disease    a. s/p CABG in 2014 with LIMA-LAD and SVG-D1 - performed in [place]   Fracture, ribs    Hyperlipidemia    Hypertension    Pancreatitis    Peripheral neuropathy      Medicines Meds ordered this encounter  Medications   sodium chloride 0.9 % bolus 1,000 mL   acetaminophen (TYLENOL) tablet 1,000 mg   ciprofloxacin (CIPRO) tablet 500 mg    I have reviewed the patients home medicines and have made adjustments as needed  Problem List / ED Course: Problem List Items Addressed This Visit   None Visit Diagnoses     Postprocedural fever    -  Primary                   Final Clinical Impression(s) / ED Diagnoses Final diagnoses:  Postprocedural fever    Rx / DC Orders ED Discharge Orders     None         Merryl Hacker, MD 11/28/22 [phone]

## 2022-11-28 NOTE — ED Notes (Signed)
Lab called regarding pt's urine sample. Lab does not have urine sample in the lab yet.

## 2022-11-28 NOTE — ED Notes (Signed)
X-ray at bedside

## 2022-11-28 NOTE — ED Triage Notes (Signed)
Pt presents with fever tonight after having drain placed in pancreatic cyst yesterday at [place]. Pt advised to come to ED for eval.

## 2022-11-28 NOTE — Telephone Encounter (Signed)
Patient is calling wishing to speak with Patty regarding his ED visit last night. Please advise

## 2022-11-28 NOTE — ED Notes (Signed)
Lab at bedside

## 2022-11-28 NOTE — Telephone Encounter (Signed)
Patient is s/p Axios stenting yesterday  Had fever of 102 with chills Minimal abdominal pain  Evaluated in ED  Better with Tylenol and IV fluids.  Normal labs except for elevated WBC count Normal lipase  Was doing much better  Plan to continue Cipro as prescribed He has follow-up with Dr. Rush Landmark.  Will inform him as well   RG

## 2022-11-29 LAB — URINE CULTURE: Culture: NO GROWTH

## 2022-11-30 LAB — AEROBIC/ANAEROBIC CULTURE W GRAM STAIN (SURGICAL/DEEP WOUND): Gram Stain: NONE SEEN

## 2022-11-30 NOTE — Telephone Encounter (Signed)
RG, Thank you for this update.  [name], Can you please reach out to the patient and see how he is doing? Thanks. GM

## 2022-12-01 ENCOUNTER — Other Ambulatory Visit: Payer: Self-pay

## 2022-12-01 ENCOUNTER — Encounter (HOSPITAL_COMMUNITY): Payer: Self-pay | Admitting: Gastroenterology

## 2022-12-01 DIAGNOSIS — D649 Anemia, unspecified: Secondary | ICD-10-CM

## 2022-12-01 DIAGNOSIS — R748 Abnormal levels of other serum enzymes: Secondary | ICD-10-CM

## 2022-12-01 DIAGNOSIS — K863 Pseudocyst of pancreas: Secondary | ICD-10-CM

## 2022-12-01 LAB — SURGICAL PATHOLOGY

## 2022-12-01 MED ORDER — CEFPODOXIME PROXETIL 100 MG PO TABS: 100.0000 mg | ORAL_TABLET | Freq: Two times a day (BID) | ORAL | 0 refills | Status: AC

## 2022-12-01 NOTE — Telephone Encounter (Signed)
See alternate results note dated 12/11

## 2022-12-01 NOTE — Telephone Encounter (Signed)
See alternate note dated 12/11

## 2022-12-02 ENCOUNTER — Telehealth: Payer: Self-pay

## 2022-12-02 ENCOUNTER — Encounter: Payer: Self-pay | Admitting: Gastroenterology

## 2022-12-02 ENCOUNTER — Other Ambulatory Visit: Payer: Self-pay

## 2022-12-02 DIAGNOSIS — K863 Pseudocyst of pancreas: Secondary | ICD-10-CM

## 2022-12-02 NOTE — Telephone Encounter (Signed)
Mansouraty, Telford Nab., MD  Timothy Lasso, RN Please send letter when able.  CT abdomen pancreas protocol in 4 weeks.  EGD with stent removal in 5 to 7 weeks in hospital.  Thanks.

## 2022-12-02 NOTE — Telephone Encounter (Signed)
EGD has been scheduled for 01/06/23 at 10 am at [facility] with GM   Left message on machine to call back     CT scan order has been entered and sent to the schedulers.

## 2022-12-03 LAB — CULTURE, BLOOD (ROUTINE X 2)
Culture: NO GROWTH
Culture: NO GROWTH
Special Requests: ADEQUATE

## 2022-12-03 NOTE — Telephone Encounter (Signed)
EGD scheduled, pt instructed and medications reviewed.  Patient instructions mailed to home and sent to My Chart.  Patient to call with any questions or concerns.  He is aware that the schedulers will call to set up CT scan

## 2022-12-21 DIAGNOSIS — E785 Hyperlipidemia, unspecified: Secondary | ICD-10-CM | POA: Diagnosis not present

## 2022-12-21 DIAGNOSIS — I10 Essential (primary) hypertension: Secondary | ICD-10-CM | POA: Diagnosis not present

## 2022-12-21 DIAGNOSIS — I2510 Atherosclerotic heart disease of native coronary artery without angina pectoris: Secondary | ICD-10-CM | POA: Diagnosis not present

## 2022-12-31 ENCOUNTER — Encounter (HOSPITAL_COMMUNITY): Payer: Self-pay | Admitting: Gastroenterology

## 2023-01-01 ENCOUNTER — Ambulatory Visit (HOSPITAL_COMMUNITY)
Admission: RE | Admit: 2023-01-01 | Discharge: 2023-01-01 | Disposition: A | Discharge status: Home / Self Care | Payer: PPO | Source: Ambulatory Visit | Attending: Gastroenterology | Admitting: Gastroenterology

## 2023-01-01 DIAGNOSIS — K8689 Other specified diseases of pancreas: Secondary | ICD-10-CM | POA: Diagnosis not present

## 2023-01-01 DIAGNOSIS — K753 Granulomatous hepatitis, not elsewhere classified: Secondary | ICD-10-CM | POA: Diagnosis not present

## 2023-01-01 DIAGNOSIS — K8020 Calculus of gallbladder without cholecystitis without obstruction: Secondary | ICD-10-CM | POA: Diagnosis not present

## 2023-01-01 DIAGNOSIS — K863 Pseudocyst of pancreas: Secondary | ICD-10-CM | POA: Insufficient documentation

## 2023-01-01 MED ORDER — IOHEXOL 300 MG/ML  SOLN
100.0000 mL | Freq: Once | INTRAMUSCULAR | Status: AC | PRN
  Administered 2023-01-01: 100 mL via INTRAVENOUS

## 2023-01-06 ENCOUNTER — Encounter (HOSPITAL_COMMUNITY)
Admission: RE | Disposition: A | Discharge status: Home / Self Care | Payer: Self-pay | Source: Home / Self Care | Attending: Gastroenterology

## 2023-01-06 ENCOUNTER — Ambulatory Visit (HOSPITAL_COMMUNITY): Payer: PPO | Admitting: Registered Nurse

## 2023-01-06 ENCOUNTER — Ambulatory Visit (HOSPITAL_BASED_OUTPATIENT_CLINIC_OR_DEPARTMENT_OTHER): Payer: PPO | Admitting: Registered Nurse

## 2023-01-06 ENCOUNTER — Other Ambulatory Visit: Payer: Self-pay

## 2023-01-06 ENCOUNTER — Ambulatory Visit (HOSPITAL_COMMUNITY)
Admission: RE | Admit: 2023-01-06 | Discharge: 2023-01-06 | Disposition: A | Discharge status: Home / Self Care | Payer: PPO | Attending: Gastroenterology | Admitting: Gastroenterology

## 2023-01-06 ENCOUNTER — Encounter (HOSPITAL_COMMUNITY): Payer: Self-pay | Admitting: Gastroenterology

## 2023-01-06 DIAGNOSIS — K222 Esophageal obstruction: Secondary | ICD-10-CM

## 2023-01-06 DIAGNOSIS — Z4659 Encounter for fitting and adjustment of other gastrointestinal appliance and device: Secondary | ICD-10-CM | POA: Diagnosis not present

## 2023-01-06 DIAGNOSIS — K449 Diaphragmatic hernia without obstruction or gangrene: Secondary | ICD-10-CM | POA: Diagnosis not present

## 2023-01-06 DIAGNOSIS — K2289 Other specified disease of esophagus: Secondary | ICD-10-CM | POA: Diagnosis not present

## 2023-01-06 DIAGNOSIS — Z951 Presence of aortocoronary bypass graft: Secondary | ICD-10-CM | POA: Diagnosis not present

## 2023-01-06 DIAGNOSIS — I10 Essential (primary) hypertension: Secondary | ICD-10-CM | POA: Diagnosis not present

## 2023-01-06 DIAGNOSIS — I2510 Atherosclerotic heart disease of native coronary artery without angina pectoris: Secondary | ICD-10-CM | POA: Diagnosis not present

## 2023-01-06 DIAGNOSIS — K2980 Duodenitis without bleeding: Secondary | ICD-10-CM

## 2023-01-06 DIAGNOSIS — K862 Cyst of pancreas: Secondary | ICD-10-CM

## 2023-01-06 DIAGNOSIS — K863 Pseudocyst of pancreas: Secondary | ICD-10-CM

## 2023-01-06 HISTORY — PX: ESOPHAGOGASTRODUODENOSCOPY (EGD) WITH PROPOFOL: SHX5813

## 2023-01-06 HISTORY — PX: STENT REMOVAL: SHX6421

## 2023-01-06 SURGERY — ESOPHAGOGASTRODUODENOSCOPY (EGD) WITH PROPOFOL
Anesthesia: Monitor Anesthesia Care

## 2023-01-06 MED ORDER — ASPIRIN 81 MG PO TABS: 81.0000 mg | ORAL_TABLET | Freq: Every day | ORAL | Status: AC

## 2023-01-06 MED ORDER — PANTOPRAZOLE SODIUM 40 MG PO TBEC: DELAYED_RELEASE_TABLET | ORAL | 3 refills | Status: AC

## 2023-01-06 MED ORDER — PHENYLEPHRINE 80 MCG/ML (10ML) SYRINGE FOR IV PUSH (FOR BLOOD PRESSURE SUPPORT)
PREFILLED_SYRINGE | INTRAVENOUS | Status: DC | PRN
  Administered 2023-01-06: 80 ug via INTRAVENOUS

## 2023-01-06 MED ORDER — SODIUM CHLORIDE 0.9 % IV SOLN: INTRAVENOUS | Status: DC

## 2023-01-06 MED ORDER — LACTATED RINGERS IV SOLN: INTRAVENOUS | Status: DC

## 2023-01-06 MED ORDER — SUCRALFATE 1 G PO TABS: 1.0000 g | ORAL_TABLET | Freq: Two times a day (BID) | ORAL | 0 refills | Status: DC

## 2023-01-06 MED ORDER — PROPOFOL 500 MG/50ML IV EMUL
INTRAVENOUS | Status: DC | PRN
  Administered 2023-01-06: 130 ug/kg/min via INTRAVENOUS
  Administered 2023-01-06 (×2): 10 mg via INTRAVENOUS

## 2023-01-06 SURGICAL SUPPLY — 15 items

## 2023-01-06 NOTE — Op Note (Signed)
[facility] Patient Name: [name] Procedure Date: 01/06/2023 MRN: [id number] Attending MD: Justice Britain , MD, [id number] Date of Birth: [birth year] CSN: [id number] Age: 72 Admit Type: Outpatient Procedure:                Upper GI endoscopy Indications:              Stent removal Providers:                Justice Britain, MD, Vista Lawman, RN, Fanny Skates RN, RN, Frazier Richards, Technician Referring MD:             Maylon Peppers, Asencion Noble Medicines:                Monitored Anesthesia Care Complications:            No immediate complications. Estimated Blood Loss:     Estimated blood loss was minimal. Procedure:                Pre-Anesthesia Assessment:                           - Prior to the procedure, a History and Physical                            was performed, and patient medications and                            allergies were reviewed. The patient's tolerance of                            previous anesthesia was also reviewed. The risks                            and benefits of the procedure and the sedation                            options and risks were discussed with the patient.                            All questions were answered, and informed consent                            was obtained. Prior Anticoagulants: The patient has                            taken no anticoagulant or antiplatelet agents                            except for aspirin. ASA Grade Assessment: III - A                            patient with severe systemic disease. After  reviewing the risks and benefits, the patient was                            deemed in satisfactory condition to undergo the                            procedure.                           After obtaining informed consent, the endoscope was                            passed under direct vision. Throughout the                             procedure, the patient's blood pressure, pulse, and                            oxygen saturations were monitored continuously. The                            [id number] ([id number]) Olympus therapeutic endoscope                            was introduced through the mouth, and advanced to                            the second part of duodenum. The upper GI endoscopy                            was accomplished without difficulty. The patient                            tolerated the procedure. Scope In: Scope Out: Findings:      No gross lesions were noted in the entire esophagus.      A non-obstructing Schatzki ring was found at the gastroesophageal       junction.      The Z-line was irregular and was found 38 cm from the incisors.      A 2 cm hiatal hernia was present.      A previously placed AXIOS cystgastrostomy stent as well as a       double-pigtail stent were found on the greater curvature of the stomach       from the previously created cystgastrostomy. Stent removal was       accomplished with combination use of a Raptor grasping device and snare.       The cystgastrostomy site was entered and the cyst cavity was noted to be       mostly collapsed. There was evidence of some mild oozing likely from the       stent having been in place. Over time this slowed. Viable pancreatic       cyst tissue was noted.      No other gross lesions were noted in the entire examined stomach.      No gross lesions were noted in the duodenal bulb, in the first portion  of the duodenum and in the second portion of the duodenum. Impression:               - No gross lesions in the entire esophagus.                            Non-obstructing Schatzki ring. Z-line irregular, 38                            cm from the incisors.                           - 2 cm hiatal hernia.                           - Previously placed AXIOS cystgastrostomy stent and                            double-pigtail stent  were noted in the                            cystgastrostomy tract. These were removed. The cyst                            cavity shows viable tissue. Mild oozing noted after                            stents were removed but this slowed by time of                            completion of procedure.                           - No other gross lesions in the entire stomach.                           - No gross lesions in the duodenal bulb, in the                            first portion of the duodenum and in the second                            portion of the duodenum. Moderate Sedation:      Not Applicable - Patient had care per Anesthesia. Recommendation:           - The patient will be observed post-procedure,                            until all discharge criteria are met.                           - Discharge patient to home.                           - Patient has a contact number available for  emergencies. The signs and symptoms of potential                            delayed complications were discussed with the                            patient. Return to normal activities tomorrow.                            Written discharge instructions were provided to the                            patient.                           - Low fat diet.                           - Observe patient's clinical course.                           - May restart aspirin in 72 hours.                           - Minimize nonsteroidal use for 1 week.                           - PPI once daily.                           - Carafate 1 g twice daily for 2 weeks.                           - Observe patient's clinical course.                           - Plan to repeat imaging study as MRI/MRCP in 3                            months. This will help evaluate the PD dilation                            that was noted (most likely currently a result of                            the  collapsed PD stent and AXIOS stent) but also                            see if the cyst recurs. If it recurs, then                            pancreatic ERCP for pancreatic duct disruption                            evaluation and  potential treatment will need to be                            strongly considered. I have discussed this with the                            patient again today.                           - The findings and recommendations were discussed                            with the patient.                           - The findings and recommendations were discussed                            with the patient's family. Procedure Code(s):        --- Professional ---                           [phone], Esophagogastroduodenoscopy, flexible,                            transoral; with removal of foreign body(s) Diagnosis Code(s):        --- Professional ---                           K22.89, Other specified disease of esophagus                           K22.2, Esophageal obstruction                           K44.9, Diaphragmatic hernia without obstruction or                            gangrene                           Z97.8, Presence of other specified devices                           Z46.59, Encounter for fitting and adjustment of                            other gastrointestinal appliance and device CPT copyright 2022 [employer]. All rights reserved. The codes documented in this report are preliminary and upon coder review may  be revised to meet current compliance requirements. Justice Britain, MD 01/06/2023 10:57:59 AM Number of Addenda: 0

## 2023-01-06 NOTE — Anesthesia Preprocedure Evaluation (Addendum)
Anesthesia Evaluation  Patient identified by MRN, date of birth, ID band Patient awake    Reviewed: Allergy & Precautions, H&P , NPO status   Airway Mallampati: II   Neck ROM: full    Dental   Pulmonary neg pulmonary ROS   breath sounds clear to auscultation       Cardiovascular hypertension, + CAD and + CABG   Rhythm:regular Rate:Normal     Neuro/Psych    GI/Hepatic   Endo/Other    Renal/GU      Musculoskeletal   Abdominal   Peds  Hematology   Anesthesia Other Findings   Reproductive/Obstetrics                             Anesthesia Physical Anesthesia Plan  ASA: 3  Anesthesia Plan: MAC   Post-op Pain Management:    Induction: Intravenous  PONV Risk Score and Plan: 1 and Propofol infusion and Treatment may vary due to age or medical condition  Airway Management Planned: Nasal Cannula  Additional Equipment:   Intra-op Plan:   Post-operative Plan:   Informed Consent: I have reviewed the patients History and Physical, chart, labs and discussed the procedure including the risks, benefits and alternatives for the proposed anesthesia with the patient or authorized representative who has indicated his/her understanding and acceptance.     Dental advisory given  Plan Discussed with: CRNA, Anesthesiologist and Surgeon  Anesthesia Plan Comments:        Anesthesia Quick Evaluation

## 2023-01-06 NOTE — H&P (Signed)
GASTROENTEROLOGY PROCEDURE H&P NOTE   Primary Care Physician: Asencion Noble, MD  HPI: [name] is a 72 y.o. male who presents for EGD for cystgastrostomy stent removal.  Past Medical History:  Diagnosis Date   Colon polyps    benign per pt   Coronary artery disease    a. s/p CABG in 2014 with LIMA-LAD and SVG-D1 - performed in [place]   Fracture, ribs    Hyperlipidemia    Hypertension    Pancreatitis    Peripheral neuropathy    Past Surgical History:  Procedure Laterality Date   BALLOON DILATION N/A 12/09/2021   Procedure: BALLOON DILATION;  Surgeon: Irving Copas., MD;  Location: [facility];  Service: Gastroenterology;  Laterality: N/A;   BALLOON DILATION N/A 11/27/2022   Procedure: BALLOON DILATION;  Surgeon: Rush Landmark Telford Nab., MD;  Location: [facility];  Service: Gastroenterology;  Laterality: N/A;   BILIARY STENT PLACEMENT N/A 12/09/2021   Procedure: BILIARY STENT PLACEMENT;  Surgeon: Rush Landmark Telford Nab., MD;  Location: [facility];  Service: Gastroenterology;  Laterality: N/A;  double pigtail stents x 2   BIOPSY  12/09/2021   Procedure: BIOPSY;  Surgeon: Rush Landmark Telford Nab., MD;  Location: [facility];  Service: Gastroenterology;;   BIOPSY  01/20/2022   Procedure: BIOPSY;  Surgeon: Irving Copas., MD;  Location: [facility];  Service: Gastroenterology;;   BIOPSY  11/27/2022   Procedure: BIOPSY;  Surgeon: Irving Copas., MD;  Location: [facility];  Service: Gastroenterology;;   CATARACT EXTRACTION W/PHACO Right 02/15/2021   Procedure: CATARACT EXTRACTION PHACO AND INTRAOCULAR LENS PLACEMENT ([facility]);  Surgeon: Baruch Goldmann, MD;  Location: [facility];  Service: Ophthalmology;  Laterality: Right;  CDE 8.65   CATARACT EXTRACTION W/PHACO Left 03/04/2021   Procedure: CATARACT EXTRACTION PHACO AND INTRAOCULAR LENS PLACEMENT ([facility]);  Surgeon: Baruch Goldmann, MD;  Location: [facility];  Service: Ophthalmology;  Laterality: Left;  CDE: 8.15    COLONOSCOPY N/A 03/11/2017   [name]: 2 sessile serrated polyps removed, next colonoscopy in 5 years   COLONOSCOPY WITH PROPOFOL N/A 04/24/2022   Procedure: COLONOSCOPY WITH PROPOFOL;  Surgeon: Rogene Houston, MD;  [facility]: [facility];  Service: Endoscopy;  Laterality: N/A;  1250   CORONARY ARTERY BYPASS GRAFT  2014   CYST GASTROSTOMY  11/27/2022   Procedure: CYST GASTROSTOMY;  Surgeon: Rush Landmark Telford Nab., MD;  Location: [facility];  Service: Gastroenterology;;   DUODENAL STENT PLACEMENT N/A 12/09/2021   Procedure: Zara Chess PLACEMENT;  Surgeon: Irving Copas., MD;  Location: [facility];  Service: Gastroenterology;  Laterality: N/A;   ESOPHAGOGASTRODUODENOSCOPY N/A 12/09/2021   Procedure: ESOPHAGOGASTRODUODENOSCOPY (EGD);  Surgeon: Irving Copas., MD;  Location: [facility];  Service: Gastroenterology;  Laterality: N/A;   ESOPHAGOGASTRODUODENOSCOPY (EGD) WITH PROPOFOL N/A 01/20/2022   Procedure: ESOPHAGOGASTRODUODENOSCOPY (EGD) WITH PROPOFOL;  Surgeon: Rush Landmark Telford Nab., MD;  Location: [facility];  Service: Gastroenterology;  Laterality: N/A;   ESOPHAGOGASTRODUODENOSCOPY (EGD) WITH PROPOFOL N/A 11/27/2022   Procedure: ESOPHAGOGASTRODUODENOSCOPY (EGD) WITH PROPOFOL;  Surgeon: Rush Landmark Telford Nab., MD;  Location: [facility];  Service: Gastroenterology;  Laterality: N/A;   EUS N/A 11/27/2022   Procedure: UPPER ENDOSCOPIC ULTRASOUND (EUS) LINEAR;  Surgeon: Irving Copas., MD;  Location: [facility];  Service: Gastroenterology;  Laterality: N/A;   HERNIA REPAIR Left 2010   inguinal   PANCREATIC STENT PLACEMENT  11/27/2022   Procedure: PANCREATIC STENT PLACEMENT;  Surgeon: Irving Copas., MD;  Location: [facility];  Service: Gastroenterology;;   POLYPECTOMY  03/11/2017   Procedure: POLYPECTOMY;  Surgeon: Rogene Houston, MD;  [facility];  Service: Endoscopy;;  colon   POLYPECTOMY  04/24/2022   Procedure: POLYPECTOMY;  Surgeon:  Rogene Houston, MD;  [facility]: [facility];  Service: Endoscopy;;   ROTATOR CUFF REPAIR Right 2014   STENT REMOVAL  01/20/2022   Procedure: STENT REMOVAL;  Surgeon: Irving Copas., MD;  Location: [facility];  Service: Gastroenterology;;   UPPER ESOPHAGEAL ENDOSCOPIC ULTRASOUND (EUS) N/A 12/09/2021   Procedure: UPPER ESOPHAGEAL ENDOSCOPIC ULTRASOUND (EUS);  Surgeon: Irving Copas., MD;  Location: [facility];  Service: Gastroenterology;  Laterality: N/A;   vericous veins  2001   Current Facility-Administered Medications  Medication Dose Route Frequency Provider Last Rate Last Admin   0.9 %  sodium chloride infusion   Intravenous Continuous Mansouraty, Telford Nab., MD       lactated ringers infusion   Intravenous Continuous Mansouraty, Telford Nab., MD 10 mL/hr at 01/06/23 0911 New Bag at 01/06/23 0911    Current Facility-Administered Medications:    0.9 %  sodium chloride infusion, , Intravenous, Continuous, Mansouraty, Telford Nab., MD   lactated ringers infusion, , Intravenous, Continuous, Mansouraty, Telford Nab., MD, Last Rate: 10 mL/hr at 01/06/23 0911, New Bag at 01/06/23 0911 No Known Allergies Family History  Problem Relation Age of Onset   Stroke Mother    Heart disease Father    Clotting disorder Father    Dementia Sister    Thyroid disease Sister    Hypertension Sister    Colon cancer Neg Hx    Esophageal cancer Neg Hx    Rectal cancer Neg Hx    Inflammatory bowel disease Neg Hx    Liver disease Neg Hx    Pancreatic cancer Neg Hx    Stomach cancer Neg Hx    Social History   Socioeconomic History   Marital status: Divorced    Spouse name: Not on file   Number of children: 2   Years of education: 16   Highest education level: Not on file  Occupational History    Comment: retired  Tobacco Use   Smoking status: Never   Smokeless tobacco: Never  Vaping Use   Vaping Use: Never used  Substance and Sexual Activity   Alcohol use: Not Currently    Drug use: No   Sexual activity: Not on file  Other Topics Concern   Not on file  Social History Narrative   Lives with sig other   caffeine-  A little   Social Determinants of Health   Financial Resource Strain: Not on file  Food Insecurity: Not on file  Transportation Needs: Not on file  Physical Activity: Not on file  Stress: Not on file  Social Connections: Not on file  Intimate Partner Violence: Not on file    Physical Exam: Today's Vitals   01/06/23 0904  BP: (!) 144/81  Pulse: 70  Resp: 10  Temp: 97.9 F (36.6 C)  TempSrc: Tympanic  SpO2: 100%  Weight: 77.1 kg  Height: '5\' 8"'$  (1.727 m)  PainSc: 0-No pain   Body mass index is 25.85 kg/m. GEN: NAD EYE: Sclerae anicteric ENT: MMM CV: Non-tachycardic GI: Soft, NT/ND NEURO:  Alert & Oriented x 3  Lab Results: No results for input(s): "WBC", "HGB", "HCT", "PLT" in the last 72 hours. BMET No results for input(s): "NA", "K", "CL", "CO2", "GLUCOSE", "BUN", "CREATININE", "CALCIUM" in the last 72 hours. LFT No results for input(s): "PROT", "ALBUMIN", "AST", "ALT", "ALKPHOS", "BILITOT", "BILIDIR", "IBILI" in the  last 72 hours. PT/INR No results for input(s): "LABPROT", "INR" in the last 72 hours.   Impression / Plan: This is a 72 y.o.male who presents for EGD for cystgastrostomy stent removal.   The risks and benefits of endoscopic evaluation/treatment were discussed with the patient and/or family; these include but are not limited to the risk of perforation, infection, bleeding, missed lesions, lack of diagnosis, severe illness requiring hospitalization, as well as anesthesia and sedation related illnesses.  The patient's history has been reviewed, patient examined, no change in status, and deemed stable for procedure.  The patient and/or family is agreeable to proceed.    Justice Britain, MD [facility] Office # [phone]

## 2023-01-06 NOTE — Anesthesia Procedure Notes (Signed)
Procedure Name: MAC Date/Time: 01/06/2023 10:12 AM  Performed by: Victoriano Lain, CRNAPre-anesthesia Checklist: Patient identified, Suction available, Emergency Drugs available, Patient being monitored and Timeout performed Patient Re-evaluated:Patient Re-evaluated prior to induction Oxygen Delivery Method: Simple face mask Placement Confirmation: positive ETCO2 Dental Injury: Teeth and Oropharynx as per pre-operative assessment

## 2023-01-06 NOTE — Discharge Instructions (Signed)
YOU HAD AN ENDOSCOPIC PROCEDURE TODAY: Refer to the procedure report and other information in the discharge instructions given to you for any specific questions about what was found during the examination. If this information does not answer your questions, please call [facility] office at [phone] to clarify.  ° °YOU SHOULD EXPECT: Some feelings of bloating in the abdomen. Passage of more gas than usual. Walking can help get rid of the air that was put into your GI tract during the procedure and reduce the bloating. If you had a lower endoscopy (such as a colonoscopy or flexible sigmoidoscopy) you may notice spotting of blood in your stool or on the toilet paper. Some abdominal soreness may be present for a day or two, also. ° °DIET: Your first meal following the procedure should be a light meal and then it is ok to progress to your normal diet. A half-sandwich or bowl of soup is an example of a good first meal. Heavy or fried foods are harder to digest and may make you feel nauseous or bloated. Drink plenty of fluids but you should avoid alcoholic beverages for 24 hours. If you had a esophageal dilation, please see attached instructions for diet.   ° °ACTIVITY: Your care partner should take you home directly after the procedure. You should plan to take it easy, moving slowly for the rest of the day. You can resume normal activity the day after the procedure however YOU SHOULD NOT DRIVE, use power tools, machinery or perform tasks that involve climbing or major physical exertion for 24 hours (because of the sedation medicines used during the test).  ° °SYMPTOMS TO REPORT IMMEDIATELY: °A gastroenterologist can be reached at any hour. Please call [phone]  for any of the following symptoms:  °Following lower endoscopy (colonoscopy, flexible sigmoidoscopy) °Excessive amounts of blood in the stool  °Significant tenderness, worsening of abdominal pains  °Swelling of the abdomen that is new, acute  °Fever of 100° or  higher  °Following upper endoscopy (EGD, EUS, ERCP, esophageal dilation) °Vomiting of blood or coffee ground material  °New, significant abdominal pain  °New, significant chest pain or pain under the shoulder blades  °Painful or persistently difficult swallowing  °New shortness of breath  °Black, tarry-looking or red, bloody stools ° °FOLLOW UP:  °If any biopsies were taken you will be contacted by phone or by letter within the next 1-3 weeks. Call [phone]  if you have not heard about the biopsies in 3 weeks.  °Please also call with any specific questions about appointments or follow up tests. ° °

## 2023-01-06 NOTE — Transfer of Care (Signed)
Immediate Anesthesia Transfer of Care Note  Patient: Nguyen Todorov  Procedure(s) Performed: ESOPHAGOGASTRODUODENOSCOPY (EGD) WITH PROPOFOL STENT REMOVAL  Patient Location: PACU and Endoscopy Unit  Anesthesia Type:MAC  Level of Consciousness: awake, alert , oriented, and patient cooperative  Airway & Oxygen Therapy: Patient Spontanous Breathing and Patient connected to face mask oxygen  Post-op Assessment: Report given to RN, Post -op Vital signs reviewed and stable, and Patient moving all extremities  Post vital signs: Reviewed and stable  Last Vitals:  Vitals Value Taken Time  BP    Temp 36.3 C 01/06/23 1047  Pulse 74 01/06/23 1048  Resp 15 01/06/23 1048  SpO2 100 % 01/06/23 1048  Vitals shown include unvalidated device data.  Last Pain:  Vitals:   01/06/23 1047  TempSrc: Temporal  PainSc:       Patients Stated Pain Goal: 3 (10/17/24 3664)  Complications: No notable events documented.

## 2023-01-08 NOTE — Anesthesia Postprocedure Evaluation (Signed)
Anesthesia Post Note  Patient: [name]  Procedure(s) Performed: ESOPHAGOGASTRODUODENOSCOPY (EGD) WITH PROPOFOL STENT REMOVAL     Patient location during evaluation: PACU Anesthesia Type: MAC Level of consciousness: awake and alert Pain management: pain level controlled Vital Signs Assessment: post-procedure vital signs reviewed and stable Respiratory status: spontaneous breathing, nonlabored ventilation, respiratory function stable and patient connected to nasal cannula oxygen Cardiovascular status: stable and blood pressure returned to baseline Postop Assessment: no apparent nausea or vomiting Anesthetic complications: no   No notable events documented.  Last Vitals:  Vitals:   01/06/23 1109 01/06/23 1116  BP: 114/75 115/61  Pulse: 63 64  Resp: 11 10  Temp:    SpO2: 100% 99%    Last Pain:  Vitals:   01/06/23 1116  TempSrc:   PainSc: 0-No pain                 [name]

## 2023-01-10 ENCOUNTER — Encounter (HOSPITAL_COMMUNITY): Payer: Self-pay | Admitting: Gastroenterology

## 2023-01-27 IMAGING — CT CT ABDOMEN WO/W CM
2 of 11 series · 9 of 46 positions shown, 15 images · IV contrast (Omnipaque or Isovue)
Comparison: 08/15/2021

CLINICAL DATA: Follow-up acute pancreatitis.

EXAM:
CT ABDOMEN WITHOUT AND WITH CONTRAST
TECHNIQUE: Multidetector CT imaging of the abdomen was performed following the
standard protocol before and following the bolus administration of
intravenous contrast.
CONTRAST:  100mL OMNIPAQUE IOHEXOL 300 MG/ML  SOLN

[Series 7: coronal arterial · coronal · arterial · 0.47mm/px · 3 of 100 slices shown, 4 images]
[im 25/100  soft-tissue]
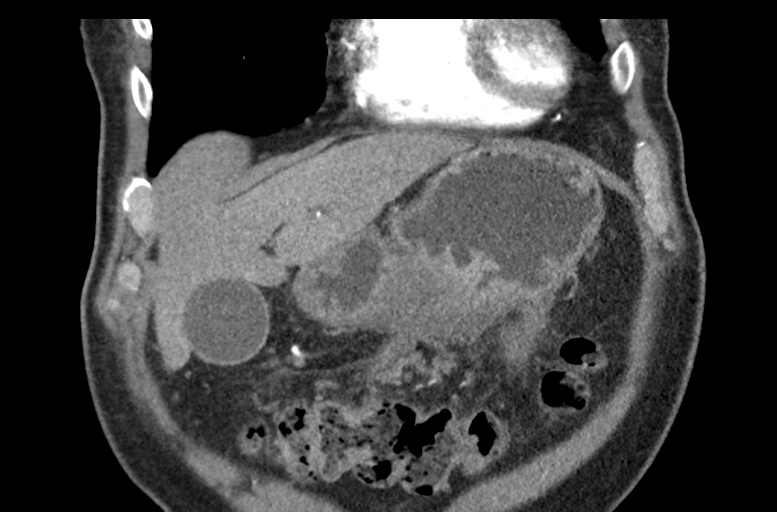
[im 50/100  soft-tissue]
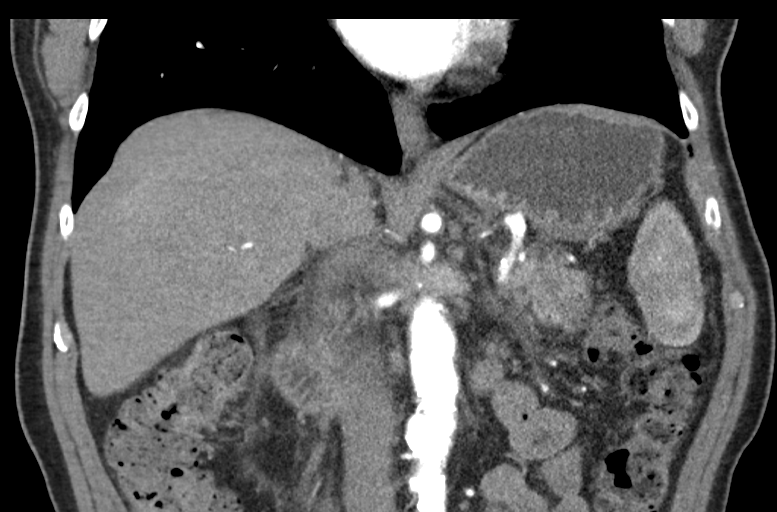
[im 50/100  bone]
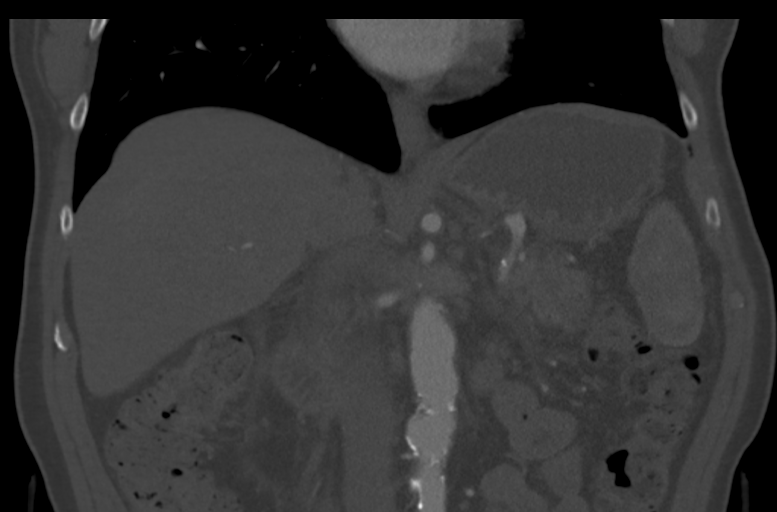
[im 75/100  soft-tissue]
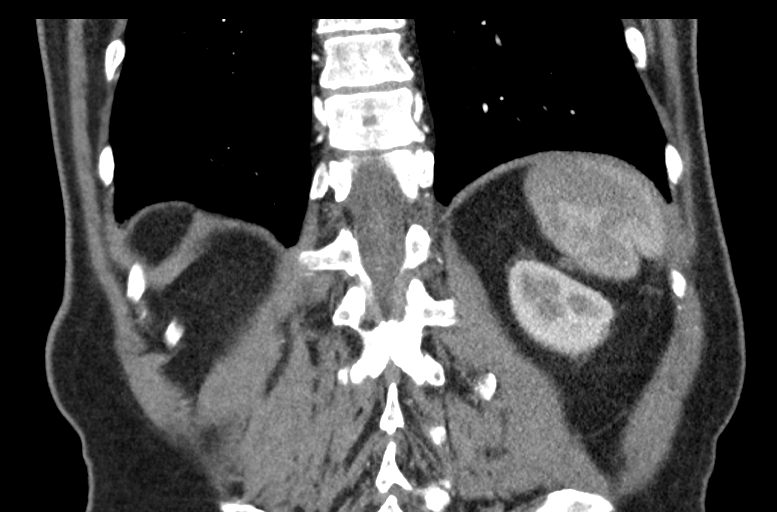

[Series 11: pancreas pv 2.0 · axial · portal-venous · 0.75mm/px · z∈[+332,+500]mm · 6 of 118 slices shown, 11 images]
[im 17/118  soft-tissue]
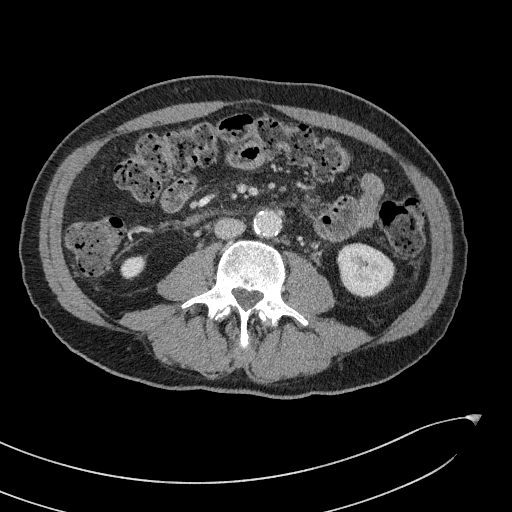
[im 17/118  bone]
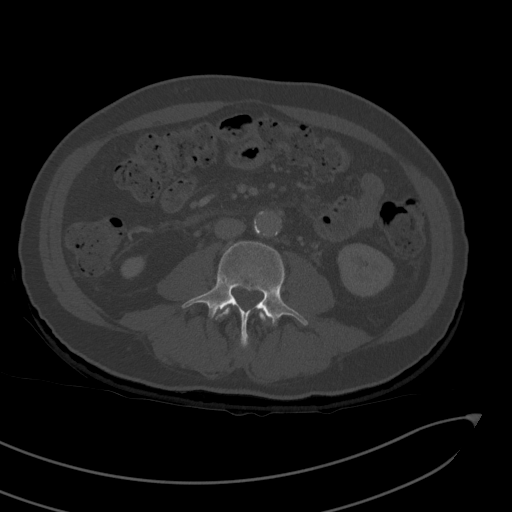
[im 34/118  soft-tissue]
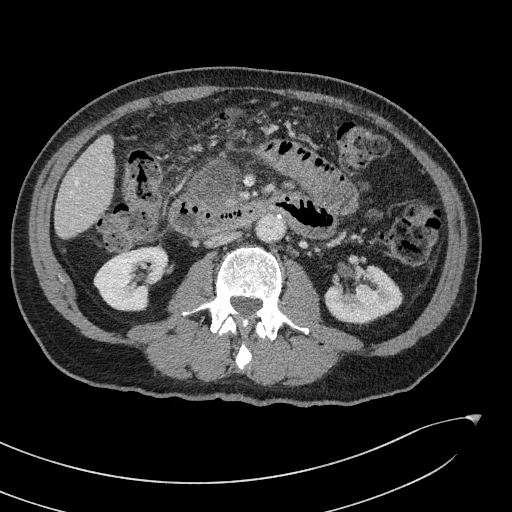
[im 51/118  soft-tissue]
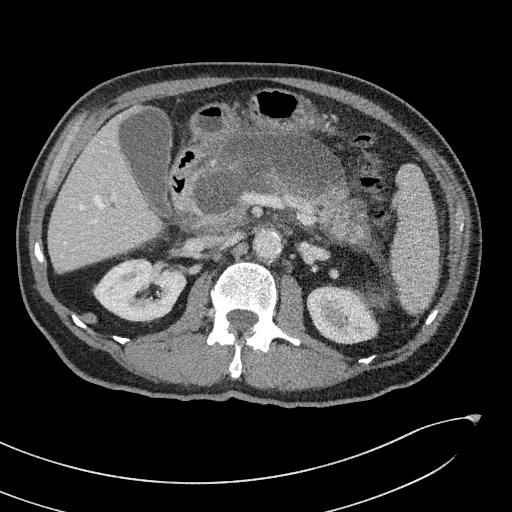
[im 51/118  lung]
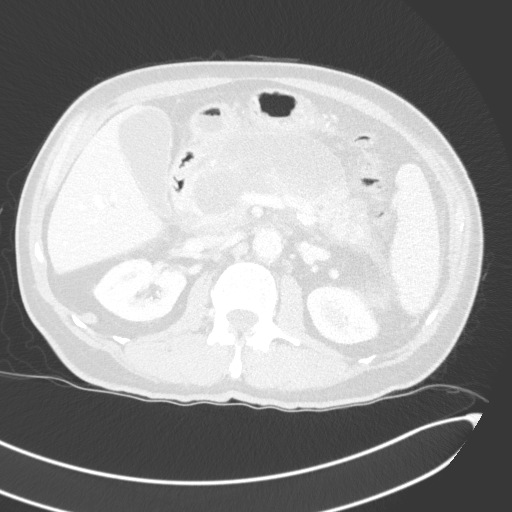
[im 67/118  soft-tissue]
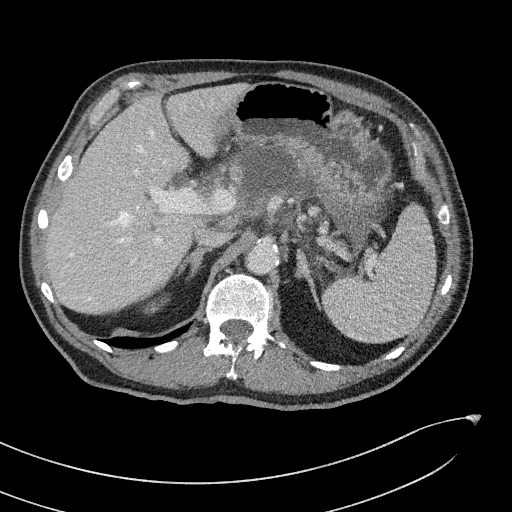
[im 67/118  lung]
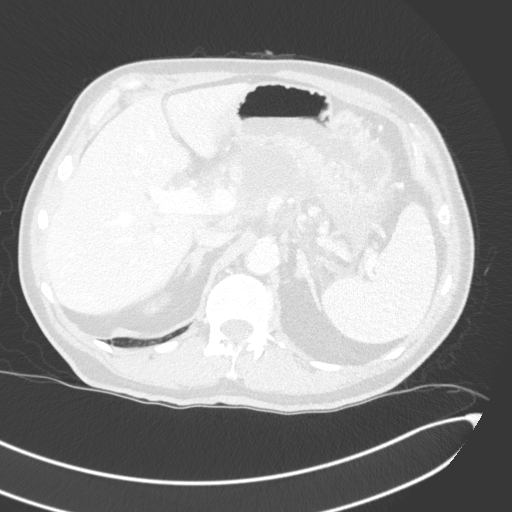
[im 84/118  soft-tissue]
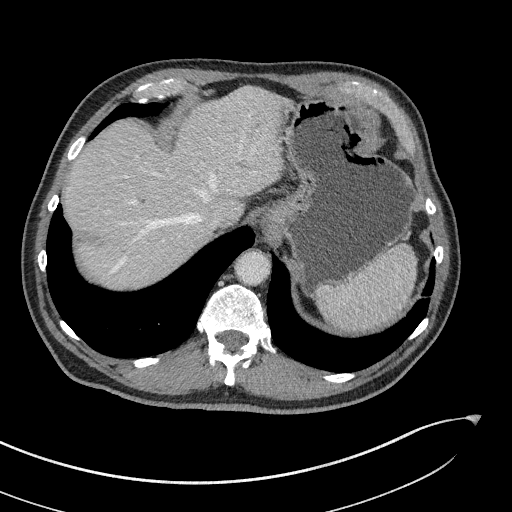
[im 84/118  lung]
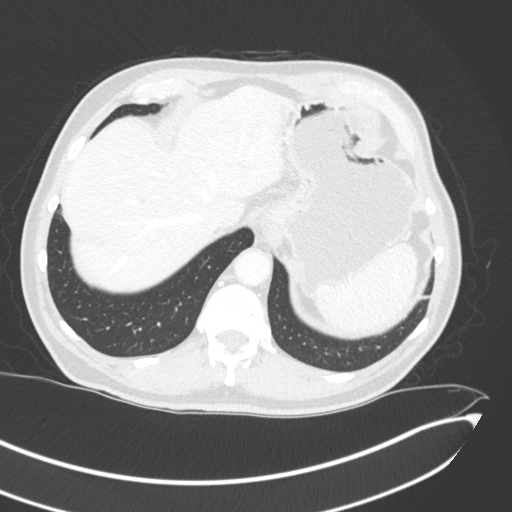
[im 101/118  soft-tissue]
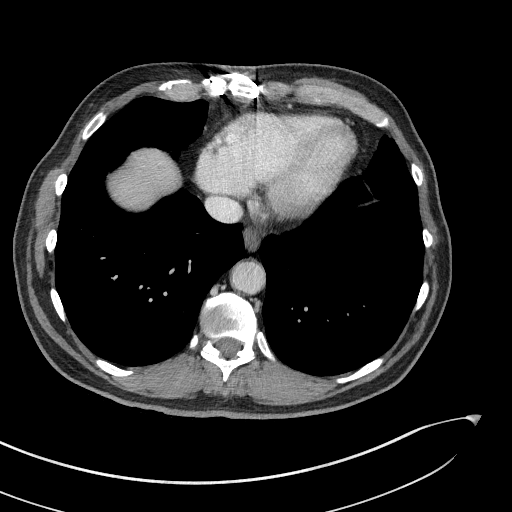
[im 101/118  lung]
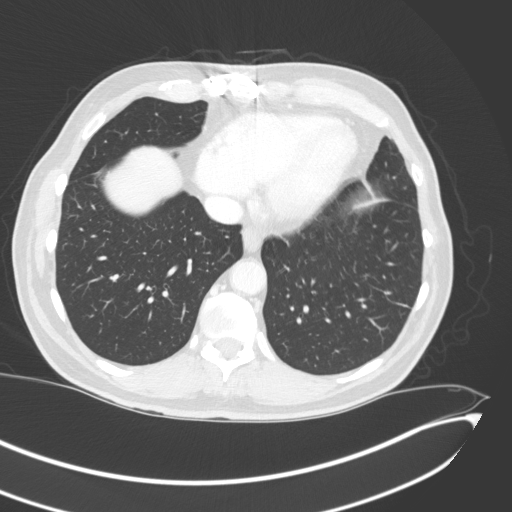

[9 of 46 positions shown; findings below may reference images not displayed]

FINDINGS: Lower chest: No acute abnormality.

Hepatobiliary: There is no focal liver abnormality. Normal
gallbladder. Mild intrahepatic bile duct dilatation. The proximal
common bile duct measures up to 8 mm, image 40/13.

Pancreas: Improved appearance of edema throughout the pancreas with
decreased peripancreatic inflammatory fat stranding. Large maturing
pseudocyst is identified along the head, neck, body and proximal
tail of pancreas. On today's exam this measures 10.7 x 4.6 by
cm. This appears more organized with a thin enhancing rim with
central decreased attenuation compared with the previous exam. On
08/15/2020 this measured 11.3 x 6.7 by 8.6 cm. On today's study the
pseudocyst extends up to and contacts with the posterior wall of
stomach, image 38/4.

Spleen: Normal in size without focal abnormality.

Adrenals/Urinary Tract: Stable nodule in the left adrenal gland
measuring 1.2 cm, image 34/9. Normal right adrenal gland. No signs
of kidney mass or hydronephrosis.

Stomach/Bowel: Stomach is normal. There is mild increase caliber of
the duodenum and proximal jejunum which measure up to 2.9 cm. Likely
reflecting ileus. The remaining visualized bowel loops are
unremarkable. Moderate stool burden noted within the colon.

Vascular/Lymphatic: Aortic atherosclerosis. No adenopathy
identified. The pancreatic pseudocyst encases and narrows the
extrahepatic portal vein which appears focally narrowed proximal to
the portal venous confluence. Splenic vein remains patent as does
the SMV. No adenopathy identified.

Other: No significant free fluid identified. Fat stranding within
the retroperitoneum with epicenter at the pancreas is again noted

Musculoskeletal: No acute or significant osseous findings.
IMPRESSION: 1. Improved appearance of edema throughout the pancreas with
decreased pancreatic edema and peripancreatic inflammatory fat
stranding.
2. Maturing pseudocyst is identified overlying the head, neck, body
and proximal tail of pancreas. This appears more organized with a
thin enhancing rim with central decreased attenuation compared with
the previous exam. This extends up to and contacts the posterior
wall of stomach.
3. The pancreatic pseudocyst encases and narrows the extrahepatic
portal vein which appears focally narrowed proximal to the portal
venous confluence. No signs of portal vein thrombosis however.
4. Mild increase caliber of the duodenum and proximal jejunum which
measure up to 2.9 cm. Likely reflecting focal ileus secondary to
pancreatic inflammation.
5. Aortic Atherosclerosis ([id number]).

## 2023-02-19 ENCOUNTER — Encounter: Payer: Self-pay | Admitting: Radiology

## 2023-03-17 DIAGNOSIS — R5383 Other fatigue: Secondary | ICD-10-CM | POA: Diagnosis not present

## 2023-03-17 DIAGNOSIS — I10 Essential (primary) hypertension: Secondary | ICD-10-CM | POA: Diagnosis not present

## 2023-03-17 DIAGNOSIS — I2510 Atherosclerotic heart disease of native coronary artery without angina pectoris: Secondary | ICD-10-CM | POA: Diagnosis not present

## 2023-03-24 DIAGNOSIS — E02 Subclinical iodine-deficiency hypothyroidism: Secondary | ICD-10-CM | POA: Diagnosis not present

## 2023-03-24 DIAGNOSIS — R5383 Other fatigue: Secondary | ICD-10-CM | POA: Diagnosis not present

## 2023-04-13 ENCOUNTER — Other Ambulatory Visit: Payer: Self-pay | Admitting: Internal Medicine

## 2023-04-13 DIAGNOSIS — E785 Hyperlipidemia, unspecified: Secondary | ICD-10-CM

## 2023-04-13 DIAGNOSIS — I25810 Atherosclerosis of coronary artery bypass graft(s) without angina pectoris: Secondary | ICD-10-CM

## 2023-05-11 ENCOUNTER — Ambulatory Visit (INDEPENDENT_AMBULATORY_CARE_PROVIDER_SITE_OTHER): Payer: PPO | Admitting: Gastroenterology

## 2023-05-11 ENCOUNTER — Encounter (INDEPENDENT_AMBULATORY_CARE_PROVIDER_SITE_OTHER): Payer: Self-pay | Admitting: Gastroenterology

## 2023-05-11 VITALS — BP 132/77 | HR 58 | Temp 97.1°F | Ht 68.0 in | Wt 178.1 lb

## 2023-05-11 DIAGNOSIS — K863 Pseudocyst of pancreas: Secondary | ICD-10-CM | POA: Diagnosis not present

## 2023-05-11 NOTE — Patient Instructions (Signed)
Schedule MRCP with and without contrast Perform blood workup

## 2023-05-11 NOTE — Progress Notes (Unsigned)
Katrinka Blazing, M.D. Gastroenterology & Hepatology [facility] [address] [place], [place] [address]  Primary Care Physician: Carylon Perches, MD [address] [place] [address]  I will communicate my assessment and recommendations to the referring MD via EMR.  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  The patient was last seen on 11/10/2022. At that time, the patient underwent an MRCP on 11/20/2022 that showed a large fluid collection suggestive of recurrent pseudocyst.  LFTs were within normal limits.  Case was discussed with Dr. Meridee Score.  The patient underwent an initial EUS on 11/27/2022.  Findings are described below:  EGD Impression: - No gross lesions in the entire esophagus. Z- line irregular, 40 cm from the incisors. - Extrinsic impression of the gastric body/ antrum. - No other gross lesions in the entire stomach. - Congested duodenal mucosa. - Erythematous and congested duodenopathy. Biopsied. EUS - A cystic lesion was seen in the pancreatic head/ genu/ body. Tissue has not been obtained. However, the endosonographic appearance is consistent with a pancreatic pseudocyst. AXIOS cystgastrostomy created. Fluid evacuated via suction. Dilated AXIOS tract. Single double pigtail placed into the pseudocyst cavity through the AXIOS.  CT of the abdomen and pelvis with and without IV contrast on 01/01/2023 showed resolution of  pancreatic pseudocyst.  Patient underwent repeat EGD on 01/06/2023.  Findings described showed: - No gross lesions in the entire esophagus. Non- obstructing Schatzki ring. Z- line irregular, 38 cm from the incisors. - 2 cm hiatal hernia. - Previously placed AXIOS cystgastrostomy stent and double- pigtail stent were noted in the cystgastrostomy tract. These were removed. The cyst cavity shows viable tissue. Mild oozing noted after stents were removed but this slowed by time of completion of procedure. - No other gross lesions in the entire stomach. - No gross lesions in the duodenal bulb, in the first portion of the duodenum and in the second portion of the duodenum.    Per note, the plan was to repeat MRCP in 3 months. This has not been scheduled.  Opatient denies having any issues. The patient denies having any nausea, vomiting, fever, chills, hematochezia, melena, hematemesis, abdominal distention, abdominal pain, diarrhea, jaundice, pruritus or weight loss.  Last Colonoscopy: 04/24/2022 - Diverticulosis - One small polyp in the proximal sigmoid colon. Biopsied. - External and internal hemorrhoids.   Recommended repeat colonoscopy in 5 years  Past Medical History: Past Medical History:  Diagnosis Date   Colon polyps    benign per pt   [name] [name]    a. s/p [name] in 2014 with LIMA-LAD and SVG-D1 - performed in [place]   Fracture, ribs    [name]    [name] [name]    Pancreatitis    [name] [name]     Past Surgical History: Past Surgical History:  Procedure Laterality Date   BALLOON DILATION N/A 12/09/2021   Procedure: BALLOON DILATION;  Surgeon: Mansouraty, Netty Starring., MD;  Location: [facility];  Service: Gastroenterology;  Laterality: N/A;   BALLOON DILATION N/A 11/27/2022   Procedure: BALLOON DILATION;  Surgeon: Meridee Score Netty Starring., MD;  Location: [facility];  Service: Gastroenterology;  Laterality: N/A;   BILIARY STENT PLACEMENT N/A 12/09/2021    Procedure: BILIARY STENT PLACEMENT;  Surgeon: Meridee Score Netty Starring., MD;  Location: [facility];  Service: Gastroenterology;  Laterality: N/A;  double pigtail stents x 2   BIOPSY  12/09/2021   Procedure: BIOPSY;  Surgeon: Meridee Score Netty Starring., MD;  Location: [facility];  Service: Gastroenterology;;   BIOPSY  01/20/2022   Procedure: BIOPSY;  Surgeon: Lemar Lofty., MD;  Location: [facility];  Service: Gastroenterology;;   BIOPSY  11/27/2022   Procedure: BIOPSY;  Surgeon: Lemar Lofty., MD;  Location: [facility];  Service: Gastroenterology;;   CATARACT EXTRACTION W/PHACO Right 02/15/2021   Procedure: CATARACT EXTRACTION PHACO AND INTRAOCULAR LENS PLACEMENT (IOC);  Surgeon: Fabio Pierce, MD;  Location: [facility];  Service: Ophthalmology;  Laterality: Right;  CDE 8.65   CATARACT EXTRACTION W/PHACO Left 03/04/2021   Procedure: CATARACT EXTRACTION PHACO AND INTRAOCULAR LENS PLACEMENT (IOC);  Surgeon: Fabio Pierce, MD;  Location: [facility];  Service: Ophthalmology;  Laterality: Left;  CDE: 8.15   COLONOSCOPY N/A 03/11/2017   [name]: 2 sessile serrated polyps removed, next colonoscopy in 5 years   COLONOSCOPY WITH PROPOFOL N/A 04/24/2022   Procedure: COLONOSCOPY WITH PROPOFOL;  Surgeon: Malissa Hippo, MD;  [facility]: [facility];  Service: Endoscopy;  Laterality: N/A;  1250   [name] ARTERY BYPASS GRAFT  2014   CYST GASTROSTOMY  11/27/2022   Procedure: CYST GASTROSTOMY;  Surgeon: Meridee Score Netty Starring., MD;  Location: [facility];  Service: Gastroenterology;;   DUODENAL STENT PLACEMENT N/A 12/09/2021   Procedure: Christy Sartorius PLACEMENT;  Surgeon: Lemar Lofty., MD;  Location: [facility];  Service: Gastroenterology;  Laterality: N/A;   ESOPHAGOGASTRODUODENOSCOPY N/A 12/09/2021   Procedure: ESOPHAGOGASTRODUODENOSCOPY (EGD);  Surgeon: Lemar Lofty., MD;  Location: [facility];  Service: Gastroenterology;  Laterality: N/A;   ESOPHAGOGASTRODUODENOSCOPY  (EGD) WITH PROPOFOL N/A 01/20/2022   Procedure: ESOPHAGOGASTRODUODENOSCOPY (EGD) WITH PROPOFOL;  Surgeon: Meridee Score Netty Starring., MD;  Location: [facility];  Service: Gastroenterology;  Laterality: N/A;   ESOPHAGOGASTRODUODENOSCOPY (EGD) WITH PROPOFOL N/A 11/27/2022   Procedure: ESOPHAGOGASTRODUODENOSCOPY (EGD) WITH PROPOFOL;  Surgeon: Meridee Score Netty Starring., MD;  Location: [facility];  Service: Gastroenterology;  Laterality: N/A;   ESOPHAGOGASTRODUODENOSCOPY (EGD) WITH PROPOFOL N/A 01/06/2023   Procedure: ESOPHAGOGASTRODUODENOSCOPY (EGD) WITH PROPOFOL;  Surgeon: Meridee Score Netty Starring., MD;  Location: [facility];  Service: Gastroenterology;  Laterality: N/A;   EUS N/A 11/27/2022   Procedure: UPPER ENDOSCOPIC ULTRASOUND (EUS) LINEAR;  Surgeon: Lemar Lofty., MD;  Location: [facility];  Service: Gastroenterology;  Laterality: N/A;   HERNIA REPAIR Left 2010   inguinal   PANCREATIC STENT PLACEMENT  11/27/2022   Procedure: PANCREATIC STENT PLACEMENT;  Surgeon: Lemar Lofty., MD;  Location: [facility];  Service: Gastroenterology;;   POLYPECTOMY  03/11/2017   Procedure: POLYPECTOMY;  Surgeon: Malissa Hippo, MD;  [facility];  Service: Endoscopy;;  colon   POLYPECTOMY  04/24/2022   Procedure: POLYPECTOMY;  Surgeon: Malissa Hippo, MD;  [facility];  Service: Endoscopy;;   ROTATOR CUFF REPAIR Right 2014   STENT REMOVAL  01/20/2022   Procedure: STENT REMOVAL;  Surgeon: Lemar Lofty., MD;  Location: [facility];  Service: Gastroenterology;;   Francine Graven REMOVAL  01/06/2023   Procedure: STENT REMOVAL;  Surgeon: Lemar Lofty., MD;  Location: [facility];  Service: Gastroenterology;;   UPPER ESOPHAGEAL ENDOSCOPIC ULTRASOUND (EUS) N/A 12/09/2021   Procedure: UPPER ESOPHAGEAL ENDOSCOPIC ULTRASOUND (EUS);  Surgeon:  Mansouraty, Netty Starring., MD;  Location: [facility];  Service: Gastroenterology;  Laterality: N/A;   vericous veins  2001     Family History: Family History  Problem Relation Age of Onset   Stroke Mother    Heart [name] Father    Clotting disorder Father    Dementia Sister    Thyroid [name] Sister    [name] [name] Sister    Colon cancer Neg Hx    Esophageal cancer Neg Hx    Rectal cancer Neg Hx    Inflammatory bowel [name] Neg Hx    Liver [name] Neg Hx    Pancreatic cancer Neg Hx    Stomach cancer Neg Hx     Social History: Social History   Tobacco Use  Smoking Status Never  Smokeless Tobacco Never   Social History   Substance and Sexual Activity  Alcohol Use Not Currently   Social History   Substance and Sexual Activity  Drug Use No    Allergies: No Known Allergies  Medications: Current Outpatient Medications  Medication Sig Dispense Refill   acetaminophen (TYLENOL) 500 MG tablet Take 1,000 mg by mouth every 6 (six) hours as needed for moderate pain or headache.     aspirin 81 MG tablet Take 1 tablet (81 mg total) by mouth daily. 30 tablet    Evolocumab (REPATHA SURECLICK) 140 MG/ML SOAJ INJECT 1 DOSE INTO THE SKIN EVERY 14 DAYS 2 mL 11   irbesartan (AVAPRO) 75 MG tablet Take 37.5 mg by mouth daily.     Multiple Vitamins-Minerals (MULTIVITAMIN WITH MINERALS) tablet Take 1 tablet by mouth daily.     pantoprazole (PROTONIX) 40 MG tablet Take one tablet daily 30 tablet 3   No current facility-administered medications for this visit.    Review of Systems: GENERAL: negative for malaise, night sweats HEENT: No changes in hearing or vision, no nose bleeds or other nasal problems. NECK: Negative for lumps, goiter, pain and significant neck swelling RESPIRATORY: Negative for cough, wheezing CARDIOVASCULAR: Negative for chest pain, leg swelling, palpitations, orthopnea GI: SEE HPI MUSCULOSKELETAL: Negative for joint pain or swelling, back pain, and muscle pain. SKIN: Negative for lesions, rash PSYCH: Negative for sleep disturbance, mood disorder and recent psychosocial  stressors. HEMATOLOGY Negative for prolonged bleeding, bruising easily, and swollen nodes. ENDOCRINE: Negative for cold or heat intolerance, polyuria, polydipsia and goiter. NEURO: negative for tremor, gait imbalance, syncope and seizures. The remainder of the review of systems is noncontributory.   Physical Exam: BP 132/77 (BP Location: Left Arm, Patient Position: Sitting, Cuff Size: Large)   Pulse (!) 58   Temp (!) 97.1 F (36.2 C) (Temporal)   Ht 5\' 8"  (1.727 m)   Wt 178 lb 1.6 oz (80.8 kg)   BMI 27.08 kg/m  GENERAL: The patient is AO x3, in no acute distress. HEENT: Head is normocephalic and atraumatic. EOMI are intact. Mouth is well hydrated and without lesions. NECK: Supple. No masses LUNGS: Clear to auscultation. No presence of rhonchi/wheezing/rales. Adequate chest expansion HEART: RRR, normal s1 and s2. ABDOMEN: Soft, nontender, no guarding, no peritoneal signs, and nondistended. BS +. No masses. RECTAL EXAM: no external lesions, normal tone, no masses, brown stool without blood.*** Chaperone: EXTREMITIES: Without any cyanosis, clubbing, rash, lesions or edema. NEUROLOGIC: AOx3, no focal motor deficit. SKIN: no jaundice, no rashes  Imaging/Labs: as above  I personally reviewed and interpreted the available labs, imaging and endoscopic files.  Impression and Plan: [name] [name] is a 72 y.o. male coming for follow up of ***  All questions were answered.      Katrinka Blazing, MD Gastroenterology and Hepatology [facility]

## 2023-05-12 DIAGNOSIS — K863 Pseudocyst of pancreas: Secondary | ICD-10-CM | POA: Diagnosis not present

## 2023-05-13 ENCOUNTER — Other Ambulatory Visit (INDEPENDENT_AMBULATORY_CARE_PROVIDER_SITE_OTHER): Payer: Self-pay | Admitting: Gastroenterology

## 2023-05-13 DIAGNOSIS — E875 Hyperkalemia: Secondary | ICD-10-CM

## 2023-05-13 LAB — COMPREHENSIVE METABOLIC PANEL
ALT: 22 IU/L (ref 0–44)
AST: 34 IU/L (ref 0–40)
Albumin/Globulin Ratio: 1.6 (ref 1.2–2.2)
Albumin: 4.6 g/dL (ref 3.8–4.8)
Alkaline Phosphatase: 89 IU/L (ref 44–121)
BUN/Creatinine Ratio: 19 (ref 10–24)
BUN: 20 mg/dL (ref 8–27)
Bilirubin Total: 0.4 mg/dL (ref 0.0–1.2)
CO2: 19 mmol/L — ABNORMAL LOW (ref 20–29)
Calcium: 9.8 mg/dL (ref 8.6–10.2)
Chloride: 105 mmol/L (ref 96–106)
Creatinine, Ser: 1.04 mg/dL (ref 0.76–1.27)
Globulin, Total: 2.8 g/dL (ref 1.5–4.5)
Glucose: 109 mg/dL — ABNORMAL HIGH (ref 70–99)
Potassium: 5.5 mmol/L — ABNORMAL HIGH (ref 3.5–5.2)
Sodium: 143 mmol/L (ref 134–144)
Total Protein: 7.4 g/dL (ref 6.0–8.5)
eGFR: 75 mL/min/{1.73_m2} (ref >59)

## 2023-05-13 LAB — CBC WITH DIFFERENTIAL/PLATELET
Basophils Absolute: 0.1 10*3/uL (ref 0.0–0.2)
Basos: 1 %
EOS (ABSOLUTE): 0.4 10*3/uL (ref 0.0–0.4)
Eos: 6 %
Hematocrit: 48 % (ref 37.5–51.0)
Hemoglobin: 15.5 g/dL (ref 13.0–17.7)
Immature Grans (Abs): 0 10*3/uL (ref 0.0–0.1)
Immature Granulocytes: 0 %
Lymphocytes Absolute: 1.4 10*3/uL (ref 0.7–3.1)
Lymphs: 24 %
MCH: 27.2 pg (ref 26.6–33.0)
MCHC: 32.3 g/dL (ref 31.5–35.7)
MCV: 84 fL (ref 79–97)
Monocytes Absolute: 0.5 10*3/uL (ref 0.1–0.9)
Monocytes: 9 %
Neutrophils Absolute: 3.4 10*3/uL (ref 1.4–7.0)
Neutrophils: 60 %
Platelets: 193 10*3/uL (ref 150–450)
RBC: 5.7 x10E6/uL (ref 4.14–5.80)
RDW: 13.2 % (ref 11.6–15.4)
WBC: 5.7 10*3/uL (ref 3.4–10.8)

## 2023-05-14 LAB — BASIC METABOLIC PANEL
BUN/Creatinine Ratio: 18 (ref 10–24)
BUN: 19 mg/dL (ref 8–27)
CO2: 24 mmol/L (ref 20–29)
Calcium: 9.6 mg/dL (ref 8.6–10.2)
Chloride: 101 mmol/L (ref 96–106)
Creatinine, Ser: 1.06 mg/dL (ref 0.76–1.27)
Glucose: 122 mg/dL — ABNORMAL HIGH (ref 70–99)
Potassium: 4.4 mmol/L (ref 3.5–5.2)
Sodium: 141 mmol/L (ref 134–144)
eGFR: 74 mL/min/{1.73_m2} (ref >59)

## 2023-06-09 ENCOUNTER — Other Ambulatory Visit (INDEPENDENT_AMBULATORY_CARE_PROVIDER_SITE_OTHER): Payer: Self-pay | Admitting: Gastroenterology

## 2023-06-09 ENCOUNTER — Ambulatory Visit (HOSPITAL_COMMUNITY)
Admission: RE | Admit: 2023-06-09 | Discharge: 2023-06-09 | Disposition: A | Discharge status: Home / Self Care | Payer: PPO | Source: Ambulatory Visit | Attending: Gastroenterology | Admitting: Gastroenterology

## 2023-06-09 DIAGNOSIS — K863 Pseudocyst of pancreas: Secondary | ICD-10-CM

## 2023-06-09 DIAGNOSIS — R935 Abnormal findings on diagnostic imaging of other abdominal regions, including retroperitoneum: Secondary | ICD-10-CM | POA: Diagnosis not present

## 2023-06-09 DIAGNOSIS — K8020 Calculus of gallbladder without cholecystitis without obstruction: Secondary | ICD-10-CM | POA: Diagnosis not present

## 2023-06-09 MED ORDER — GADOBUTROL 1 MMOL/ML IV SOLN
7.5000 mL | Freq: Once | INTRAVENOUS | Status: AC | PRN
  Administered 2023-06-09: 7.5 mL via INTRAVENOUS

## 2023-06-23 ENCOUNTER — Telehealth (INDEPENDENT_AMBULATORY_CARE_PROVIDER_SITE_OTHER): Payer: Self-pay | Admitting: Gastroenterology

## 2023-06-23 NOTE — Telephone Encounter (Signed)
Left detailed message on pt voicemail (OK per DPR)

## 2023-06-23 NOTE — Telephone Encounter (Signed)
Hi,  Dr. Meridee Score was out of town last week, I'm still waiting to hear back from him. Thanks

## 2023-06-23 NOTE — Telephone Encounter (Signed)
Pt called in and states it has been one week since he has been referred to Dr. Meridee Score. Upon reviewing chart, provider is awaiting response from Dr.Mansouraty. advised pt of this and pt states he was just wondering if something else needed to be done since it has been one week.   Please advise. Thank you

## 2023-06-24 ENCOUNTER — Other Ambulatory Visit (INDEPENDENT_AMBULATORY_CARE_PROVIDER_SITE_OTHER): Payer: Self-pay | Admitting: Gastroenterology

## 2023-06-24 DIAGNOSIS — K863 Pseudocyst of pancreas: Secondary | ICD-10-CM

## 2023-07-10 DIAGNOSIS — H903 Sensorineural hearing loss, bilateral: Secondary | ICD-10-CM | POA: Diagnosis not present

## 2023-07-24 ENCOUNTER — Ambulatory Visit: Payer: PPO | Attending: Internal Medicine | Admitting: Internal Medicine

## 2023-07-24 ENCOUNTER — Encounter: Payer: Self-pay | Admitting: Internal Medicine

## 2023-07-24 VITALS — BP 116/80 | HR 64 | Ht 68.0 in | Wt 179.2 lb

## 2023-07-24 DIAGNOSIS — T466X5D Adverse effect of antihyperlipidemic and antiarteriosclerotic drugs, subsequent encounter: Secondary | ICD-10-CM | POA: Diagnosis not present

## 2023-07-24 DIAGNOSIS — M791 Myalgia, unspecified site: Secondary | ICD-10-CM | POA: Diagnosis not present

## 2023-07-24 DIAGNOSIS — E785 Hyperlipidemia, unspecified: Secondary | ICD-10-CM

## 2023-07-24 DIAGNOSIS — I25119 Atherosclerotic heart disease of native coronary artery with unspecified angina pectoris: Secondary | ICD-10-CM | POA: Diagnosis not present

## 2023-07-24 NOTE — Progress Notes (Signed)
LIPID CLINIC CONSULT NOTE  Chief Complaint:  Follow-up dyslipidemia  Primary Care Physician: Carylon Perches, MD  Primary Cardiologist:  Nona Dell, MD  HPI:  [name] is a 72 y.o. male who is being seen today for the evaluation of dyslipidemia at the request of Dr. Purvis Sheffield.  This is a pleasant 72 year old male with a history of coronary artery disease, hypertension and dyslipidemia.  He is followed by my partner Dr. Purvis Sheffield in [place] and was referred for evaluation and management of dyslipidemia.  His most recent labs drawn by his PCP in August 2020 showed total cholesterol 171, HDL 33, LDL 109 and triglycerides 161.  This target LDL is less than 70.  According to his cardiologist he had two-vessel bypass surgery in 2014.  He is also been intolerant of low-dose lovastatin and rosuvastatin.  He was also on ezetimibe but he discontinued that.  All of this is related to intolerances which included myalgias.  He does report a variable diet but is not significantly atherogenic.  He does remain physically active.  01/17/2020  [name] is seen today in follow-up.  Overall he is done well with Repatha and has tolerated it without any incidence.  He denies any myalgias.  Cholesterol is significantly improved, in fact his total is now 101, triglycerides 137, HDL 33 and LDL 44.  07/15/2022  [name] returns today for follow-up.  Unfortunately, he had pancreatitis recently.  The etiology was unclear however it was noted to have a large pancreatic pseudocyst.  He was ultimately found to have several small gallstones.  It was advised to stop Repatha because of possible relation, however he had restarted it per their recommendations back about 3 or 4 months ago.  He has not had repeat labs since then.  Overall he seems to be doing much better and is tolerating it well.  07/24/2023  [name] returns today for follow-up.  He is followed closely with Dr. Diona Browner.  He has an appointment  coming up in October.  He remains on Repatha and is tolerating it well.  His last LDL was a year ago but was low at 37.  He has had pancreatic pseudocyst which is being imaged again in August.  This has been drained several times and seems to be getting smaller.  No symptoms with that.  He denies any cardiac symptoms as well although does have generalized fatigue.  PMHx:  Past Medical History:  Diagnosis Date   Colon polyps    benign per pt   Coronary artery disease    a. s/p CABG in 2014 with LIMA-LAD and SVG-D1 - performed in [place]   Fracture, ribs    Hyperlipidemia    Hypertension    Pancreatitis    Peripheral neuropathy     Past Surgical History:  Procedure Laterality Date   BALLOON DILATION N/A 12/09/2021   Procedure: BALLOON DILATION;  Surgeon: Mansouraty, Netty Starring., MD;  Location: [facility];  Service: Gastroenterology;  Laterality: N/A;   BALLOON DILATION N/A 11/27/2022   Procedure: BALLOON DILATION;  Surgeon: Meridee Score Netty Starring., MD;  Location: [facility];  Service: Gastroenterology;  Laterality: N/A;   BILIARY STENT PLACEMENT N/A 12/09/2021   Procedure: BILIARY STENT PLACEMENT;  Surgeon: Meridee Score Netty Starring., MD;  Location: [facility];  Service: Gastroenterology;  Laterality: N/A;  double pigtail stents x 2   BIOPSY  12/09/2021   Procedure: BIOPSY;  Surgeon: Meridee Score Netty Starring., MD;  Location: [facility];  Service: Gastroenterology;;   BIOPSY  01/20/2022   Procedure: BIOPSY;  Surgeon: Lemar Lofty., MD;  Location: [facility];  Service: Gastroenterology;;   BIOPSY  11/27/2022   Procedure: BIOPSY;  Surgeon: Lemar Lofty., MD;  Location: [facility];  Service: Gastroenterology;;   CATARACT EXTRACTION W/PHACO Right 02/15/2021   Procedure: CATARACT EXTRACTION PHACO AND INTRAOCULAR LENS PLACEMENT (IOC);  Surgeon: Fabio Pierce, MD;  Location: [facility];  Service: Ophthalmology;  Laterality: Right;  CDE 8.65   CATARACT EXTRACTION W/PHACO Left  03/04/2021   Procedure: CATARACT EXTRACTION PHACO AND INTRAOCULAR LENS PLACEMENT (IOC);  Surgeon: Fabio Pierce, MD;  Location: [facility];  Service: Ophthalmology;  Laterality: Left;  CDE: 8.15   COLONOSCOPY N/A 03/11/2017   [name]: 2 sessile serrated polyps removed, next colonoscopy in 5 years   COLONOSCOPY WITH PROPOFOL N/A 04/24/2022   Procedure: COLONOSCOPY WITH PROPOFOL;  Surgeon: Malissa Hippo, MD;  [facility];  Service: Endoscopy;  Laterality: N/A;  1250   CORONARY ARTERY BYPASS GRAFT  2014   CYST GASTROSTOMY  11/27/2022   Procedure: CYST GASTROSTOMY;  Surgeon: Meridee Score Netty Starring., MD;  Location: [facility];  Service: Gastroenterology;;   DUODENAL STENT PLACEMENT N/A 12/09/2021   Procedure: Christy Sartorius PLACEMENT;  Surgeon: Lemar Lofty., MD;  Location: [facility];  Service: Gastroenterology;  Laterality: N/A;   ESOPHAGOGASTRODUODENOSCOPY N/A 12/09/2021   Procedure: ESOPHAGOGASTRODUODENOSCOPY (EGD);  Surgeon: Lemar Lofty., MD;  Location: [facility];  Service: Gastroenterology;  Laterality: N/A;   ESOPHAGOGASTRODUODENOSCOPY (EGD) WITH PROPOFOL N/A 01/20/2022   Procedure: ESOPHAGOGASTRODUODENOSCOPY (EGD) WITH PROPOFOL;  Surgeon: Meridee Score Netty Starring., MD;  [facility]: [facility];  Service: Gastroenterology;  Laterality: N/A;   ESOPHAGOGASTRODUODENOSCOPY (EGD) WITH PROPOFOL N/A 11/27/2022   Procedure: ESOPHAGOGASTRODUODENOSCOPY (EGD) WITH PROPOFOL;  Surgeon: Meridee Score Netty Starring., MD;  [facility]: [facility];  Service: Gastroenterology;  Laterality: N/A;   ESOPHAGOGASTRODUODENOSCOPY (EGD) WITH PROPOFOL N/A 01/06/2023   Procedure: ESOPHAGOGASTRODUODENOSCOPY (EGD) WITH PROPOFOL;  Surgeon: Meridee Score Netty Starring., MD;  Location: [facility];  Service: Gastroenterology;  Laterality: N/A;   EUS N/A 11/27/2022   Procedure: UPPER ENDOSCOPIC ULTRASOUND (EUS) LINEAR;  Surgeon: Lemar Lofty., MD;  Location: [facility];  Service: Gastroenterology;   Laterality: N/A;   HERNIA REPAIR Left 2010   inguinal   PANCREATIC STENT PLACEMENT  11/27/2022   Procedure: PANCREATIC STENT PLACEMENT;  Surgeon: Lemar Lofty., MD;  Location: [facility];  Service: Gastroenterology;;   POLYPECTOMY  03/11/2017   Procedure: POLYPECTOMY;  Surgeon: Malissa Hippo, MD;  [facility];  Service: Endoscopy;;  colon   POLYPECTOMY  04/24/2022   Procedure: POLYPECTOMY;  Surgeon: Malissa Hippo, MD;  [facility];  Service: Endoscopy;;   ROTATOR Bell REPAIR Right 2014   STENT REMOVAL  01/20/2022   Procedure: STENT REMOVAL;  Surgeon: Lemar Lofty., MD;  Location: [facility];  Service: Gastroenterology;;   Francine Graven REMOVAL  01/06/2023   Procedure: STENT REMOVAL;  Surgeon: Lemar Lofty., MD;  Location: [facility];  Service: Gastroenterology;;   UPPER ESOPHAGEAL ENDOSCOPIC ULTRASOUND (EUS) N/A 12/09/2021   Procedure: UPPER ESOPHAGEAL ENDOSCOPIC ULTRASOUND (EUS);  Surgeon: Lemar Lofty., MD;  Location: [facility];  Service: Gastroenterology;  Laterality: N/A;   vericous veins  2001    FAMHx:  Family History  Problem Relation Age of Onset   Stroke Mother    Heart disease Father    Clotting disorder Father    Dementia Sister    Thyroid disease Sister    Hypertension Sister    Colon cancer  Neg Hx    Esophageal cancer Neg Hx    Rectal cancer Neg Hx    Inflammatory bowel disease Neg Hx    Liver disease Neg Hx    Pancreatic cancer Neg Hx    Stomach cancer Neg Hx     SOCHx:   reports that he has never smoked. He has never used smokeless tobacco. He reports that he does not currently use alcohol. He reports that he does not use drugs.  ALLERGIES:  No Known Allergies  ROS: Pertinent items noted in HPI and remainder of comprehensive ROS otherwise negative.  HOME MEDS: Current Outpatient Medications on File Prior to Visit  Medication Sig Dispense Refill   aspirin 81 MG tablet Take 1 tablet (81 mg  total) by mouth daily. 30 tablet    Evolocumab (REPATHA SURECLICK) 140 MG/ML SOAJ INJECT 1 DOSE INTO THE SKIN EVERY 14 DAYS 2 mL 11   irbesartan (AVAPRO) 75 MG tablet Take 37.5 mg by mouth daily.     Multiple Vitamins-Minerals (MULTIVITAMIN WITH MINERALS) tablet Take 1 tablet by mouth daily.     pantoprazole (PROTONIX) 40 MG tablet Take one tablet daily 30 tablet 3   acetaminophen (TYLENOL) 500 MG tablet Take 1,000 mg by mouth every 6 (six) hours as needed for moderate pain or headache.     No current facility-administered medications on file prior to visit.    LABS/IMAGING: No results found for this or any previous visit (from the past 48 hour(s)). No results found.  LIPID PANEL:    Component Value Date/Time   CHOL 94 (L) 07/17/2022 0744   TRIG 106 07/17/2022 0744   HDL 37 (L) 07/17/2022 0744   CHOLHDL 2.5 07/17/2022 0744   CHOLHDL 2.9 08/07/2021 1744   VLDL 22 08/07/2021 1744   LDLCALC 37 07/17/2022 0744    WEIGHTS: Wt Readings from Last 3 Encounters:  07/24/23 179 lb 3.2 oz (81.3 kg)  05/11/23 178 lb 1.6 oz (80.8 kg)  01/06/23 170 lb (77.1 kg)    VITALS: BP 116/80 (BP Location: Left Arm, Patient Position: Sitting, Bell Size: Normal)   Pulse 64   Ht 5\' 8"  (1.727 m)   Wt 179 lb 3.2 oz (81.3 kg)   SpO2 99%   BMI 27.25 kg/m   EXAM: Deferred  EKG: Deferred  ASSESSMENT: Mixed dyslipidemia, goal LDL less than 70 Coronary disease status post two-vessel CABG (2014) Hypertension Statin and ezetimibe intolerance History of gallstone pancreatitis  PLAN: 1.   [name] seems to be doing well other than some generalized fatigue but he denies any anginal symptoms.  He remains on Repatha which has been well-tolerated with an LDL in the past below 40.  He is due for repeat lipids.  Will go ahead and order those.  Plan follow-up with Dr. Diona Browner in October and me annually or sooner as necessary.  Chrystie Nose, MD, [facility], FACP  [place]  [facility] Medical  Director of the Advanced Lipid Disorders &   Cardiovascular Risk Reduction Clinic Diplomate of the American Board of Clinical Lipidology Attending Cardiologist  Direct Dial: [phone]  Fax: [phone]  Website:  [email or web address]  07/24/2023, 10:07 AM

## 2023-07-24 NOTE — Patient Instructions (Signed)
Medication Instructions:  NO CHANGES   *If you need a refill on your cardiac medications before your next appointment, please call your pharmacy*   Lab Work: FASTING NMR lipoprofile and LPa next week  [facility] locations:   [facility] - [address] - [address] [place] [address]   If you have labs (blood work) drawn today and your tests are completely normal, you will receive your results only by: [employer] (if you have MyChart) OR A paper copy in the mail If you have any lab test that is abnormal or we need to change your treatment, we will call you to review the results.   Follow-Up: At [facility], you and your health needs are our priority.  As part of our continuing mission to provide you with exceptional heart care, we have created designated Provider Care Teams.  These Care Teams include your primary Cardiologist (physician) and Advanced Practice Providers (APPs -  Physician Assistants and Nurse Practitioners) who all work together to provide you with the care you need, when you need it.  We recommend signing up for the patient portal called "MyChart".  Sign up information is provided on this After Visit Summary.  MyChart is used to connect with patients for Virtual Visits (Telemedicine).  Patients are able to view lab/test results, encounter notes, upcoming appointments, etc.  Non-urgent messages can be sent to your provider as well.   To learn more about what you can do with MyChart, go to [email or web address].    Your next appointment:    12 months with Dr. Rennis Golden

## 2023-07-29 DIAGNOSIS — H903 Sensorineural hearing loss, bilateral: Secondary | ICD-10-CM | POA: Diagnosis not present

## 2023-07-29 DIAGNOSIS — E785 Hyperlipidemia, unspecified: Secondary | ICD-10-CM | POA: Diagnosis not present

## 2023-08-19 ENCOUNTER — Ambulatory Visit (HOSPITAL_COMMUNITY)
Admission: RE | Admit: 2023-08-19 | Discharge: 2023-08-19 | Disposition: A | Discharge status: Home / Self Care | Payer: PPO | Source: Ambulatory Visit | Attending: Gastroenterology | Admitting: Gastroenterology

## 2023-08-19 ENCOUNTER — Other Ambulatory Visit (INDEPENDENT_AMBULATORY_CARE_PROVIDER_SITE_OTHER): Payer: Self-pay | Admitting: Gastroenterology

## 2023-08-19 DIAGNOSIS — K8689 Other specified diseases of pancreas: Secondary | ICD-10-CM | POA: Diagnosis not present

## 2023-08-19 DIAGNOSIS — K863 Pseudocyst of pancreas: Secondary | ICD-10-CM

## 2023-08-19 DIAGNOSIS — K859 Acute pancreatitis without necrosis or infection, unspecified: Secondary | ICD-10-CM | POA: Diagnosis not present

## 2023-08-19 DIAGNOSIS — K8020 Calculus of gallbladder without cholecystitis without obstruction: Secondary | ICD-10-CM | POA: Diagnosis not present

## 2023-08-19 MED ORDER — GADOBUTROL 1 MMOL/ML IV SOLN
7.0000 mL | Freq: Once | INTRAVENOUS | Status: AC | PRN
  Administered 2023-08-19: 7 mL via INTRAVENOUS

## 2023-08-26 ENCOUNTER — Telehealth: Payer: Self-pay

## 2023-08-26 DIAGNOSIS — K8689 Other specified diseases of pancreas: Secondary | ICD-10-CM

## 2023-08-26 DIAGNOSIS — K862 Cyst of pancreas: Secondary | ICD-10-CM

## 2023-08-26 DIAGNOSIS — K863 Pseudocyst of pancreas: Secondary | ICD-10-CM

## 2023-08-26 NOTE — Telephone Encounter (Signed)
Virtual visit has been set up for 09/02/23 at 1130 am with GM.   Left message on machine to call back

## 2023-08-26 NOTE — Telephone Encounter (Signed)
-----   Message from [facility] sent at 08/26/2023  5:46 AM EDT ----- [name], With it slightly enlarging, I do think it is worth considering an attempted pancreatic duct ERCP and stenting.  Thankfully it is not enlarged significantly where in which a severe ductal disruption is occurring but maybe we can try to stave things off.  Kerri Kovacik, Please reach out to this patient and let him know that Dr. Levon Hedger and I have talked about his recent MRI and due to the enlarging cyst, we would like to talk with him further about a pancreatic ERCP (I have spoken with him and his wife about this briefly in the past).  I would like to set up a clinic visit and I am okay with this being a virtual done at the end of a clinic session most likely the end of the p.m. session.  You can offer him an ERCP appointment in the next 3 months if he desires or he wants to wait to discuss things in clinic first.  Let us know what he decides to do. Thanks. GM ----- Message ----- From: Dolores Frame, MD Sent: 08/25/2023   6:08 PM EDT To: Lemar Lofty., MD  [facility] all is good. So this gentleman's MRCP showed enlargement of pseudocyst, has grown 1 cm. Would you like to do ERCP to evalute his pancreas. Thanks ----- Message ----- From: Interface, Rad Results In Sent: 08/23/2023   5:59 PM EDT To: Dolores Frame, MD

## 2023-08-27 NOTE — Telephone Encounter (Signed)
Left message on machine to call back  

## 2023-08-27 NOTE — Telephone Encounter (Signed)
See alternate note dated 9/4

## 2023-08-27 NOTE — Telephone Encounter (Signed)
Patient is returning your call.  

## 2023-08-27 NOTE — Telephone Encounter (Signed)
The pt has been advised of the virtual appt. He agrees and will be on the lookout for the call.

## 2023-09-02 ENCOUNTER — Telehealth: Payer: PPO | Admitting: Gastroenterology

## 2023-09-02 ENCOUNTER — Encounter: Payer: Self-pay | Admitting: Gastroenterology

## 2023-09-02 DIAGNOSIS — K863 Pseudocyst of pancreas: Secondary | ICD-10-CM

## 2023-09-02 DIAGNOSIS — K8689 Other specified diseases of pancreas: Secondary | ICD-10-CM | POA: Insufficient documentation

## 2023-09-02 NOTE — Progress Notes (Signed)
GASTROENTEROLOGY OUTPATIENT CLINIC VISIT   Primary Care Provider Carylon Perches, MD [address] [place] [address] [phone]  Referring Provider Dr. Karilyn Cota then Dr. Levon Hedger  Patient Profile: [name] is a 72 y.o. male with a pmh significant for CAD (status post CABG), hypertension, hyperlipidemia, colon polyps, GERD, recent complicated necrotizing pancreatitis (etiology unclear drug-induced from Lyrica versus microlithiasis (no cholelithiasis on imaging) and very minimal alcohol consumption) with walled off necrosis/peripancreatic fluid collection development (s/p 2 previous cystgastrostomies) and now with persisting pseudocyst (enlarging).  The patient presents to the [facility] for an evaluation and management of problem(s) noted below:  Problem List 1. Pancreatic pseudocyst   2. Pancreatic duct disruption    I connected with  [name]. I verified that I was speaking with the correct person using two identifiers. This service was provided via telemedicine. The patient was located at home. The provider was located in the office. The patient did consent to this visit and is aware of charges through their insurance as well as the limitations of evaluation and management by telemedicine. The patient was referred by  Other persons participating in this telemedicine service were none.   History of Present Illness Please see prior GI notes for full details of HPI.  Interval History The patient overall has been doing well.  His most recent imaging was forwarded by his [facility] provider and showed that the pseudocyst which had formed post stent removal has slowly enlarged.  There is also some pancreatic ductal dilatation.  Patient himself is feeling well without any other significant GI symptoms.  He is not having any significant changes in his bowel habits either at this time.  GI Review of Systems Positive as above Negative for  dysphagia, odynophagia, alteration of bowel habits, melena, hematochezia  Review of Systems General: Denies fevers/chills/unintentional weight loss Cardiovascular: Denies chest pain/palpitations Pulmonary: Denies shortness of breath Gastroenterological: See HPI Genitourinary: Denies darkened urine Hematological: Denies easy bruising/bleeding Dermatological: Denies jaundice Psychological: Mood is stable   Medications Current Outpatient Medications  Medication Sig Dispense Refill   acetaminophen (TYLENOL) 500 MG tablet Take 1,000 mg by mouth every 6 (six) hours as needed for moderate pain or headache.     aspirin 81 MG tablet Take 1 tablet (81 mg total) by mouth daily. 30 tablet    Evolocumab (REPATHA SURECLICK) 140 MG/ML SOAJ INJECT 1 DOSE INTO THE SKIN EVERY 14 DAYS 2 mL 11   irbesartan (AVAPRO) 75 MG tablet Take 37.5 mg by mouth daily.     Multiple Vitamins-Minerals (MULTIVITAMIN WITH MINERALS) tablet Take 1 tablet by mouth daily.     pantoprazole (PROTONIX) 40 MG tablet Take one tablet daily 30 tablet 3   No current facility-administered medications for this visit.    Allergies No Known Allergies   Histories Past Medical History:  Diagnosis Date   Colon polyps    benign per pt   Coronary artery disease    a. s/p CABG in 2014 with LIMA-LAD and SVG-D1 - performed in [place]   Fracture, ribs    Hyperlipidemia    Hypertension    Pancreatitis    Peripheral neuropathy    Past Surgical History:  Procedure Laterality Date   BALLOON DILATION N/A 12/09/2021   Procedure: BALLOON DILATION;  Surgeon: Lemar Lofty., MD;  Location: [facility];  Service: Gastroenterology;  Laterality: N/A;   BALLOON DILATION N/A 11/27/2022   Procedure: BALLOON DILATION;  Surgeon: Meridee Score Netty Starring., MD;  Location: [facility];  Service: Gastroenterology;  Laterality: N/A;   BILIARY STENT PLACEMENT N/A 12/09/2021   Procedure: BILIARY STENT PLACEMENT;  Surgeon: Meridee Score Netty Starring.,  MD;  Location: [facility];  Service: Gastroenterology;  Laterality: N/A;  double pigtail stents x 2   BIOPSY  12/09/2021   Procedure: BIOPSY;  Surgeon: Meridee Score Netty Starring., MD;  Location: [facility];  Service: Gastroenterology;;   BIOPSY  01/20/2022   Procedure: BIOPSY;  Surgeon: Lemar Lofty., MD;  Location: [facility];  Service: Gastroenterology;;   BIOPSY  11/27/2022   Procedure: BIOPSY;  Surgeon: Lemar Lofty., MD;  Location: [facility];  Service: Gastroenterology;;   CATARACT EXTRACTION W/PHACO Right 02/15/2021   Procedure: CATARACT EXTRACTION PHACO AND INTRAOCULAR LENS PLACEMENT (IOC);  Surgeon: Fabio Pierce, MD;  [facility]: [facility];  Service: Ophthalmology;  Laterality: Right;  CDE 8.65   CATARACT EXTRACTION W/PHACO Left 03/04/2021   Procedure: CATARACT EXTRACTION PHACO AND INTRAOCULAR LENS PLACEMENT (IOC);  Surgeon: Fabio Pierce, MD;  Location: [facility];  Service: Ophthalmology;  Laterality: Left;  CDE: 8.15   COLONOSCOPY N/A 03/11/2017   [name]: 2 sessile serrated polyps removed, next colonoscopy in 5 years   COLONOSCOPY WITH PROPOFOL N/A 04/24/2022   Procedure: COLONOSCOPY WITH PROPOFOL;  Surgeon: Malissa Hippo, MD;  [facility];  Service: Endoscopy;  Laterality: N/A;  1250   CORONARY ARTERY BYPASS GRAFT  2014   CYST GASTROSTOMY  11/27/2022   Procedure: CYST GASTROSTOMY;  Surgeon: Meridee Score Netty Starring., MD;  Location: [facility];  Service: Gastroenterology;;   DUODENAL STENT PLACEMENT N/A 12/09/2021   Procedure: Christy Sartorius PLACEMENT;  Surgeon: Lemar Lofty., MD;  Location: [facility];  Service: Gastroenterology;  Laterality: N/A;   ESOPHAGOGASTRODUODENOSCOPY N/A 12/09/2021   Procedure: ESOPHAGOGASTRODUODENOSCOPY (EGD);  Surgeon: Lemar Lofty., MD;  Location: [facility];  Service: Gastroenterology;  Laterality: N/A;   ESOPHAGOGASTRODUODENOSCOPY (EGD) WITH PROPOFOL N/A 01/20/2022   Procedure:  ESOPHAGOGASTRODUODENOSCOPY (EGD) WITH PROPOFOL;  Surgeon: Meridee Score Netty Starring., MD;  [facility];  Service: Gastroenterology;  Laterality: N/A;   ESOPHAGOGASTRODUODENOSCOPY (EGD) WITH PROPOFOL N/A 11/27/2022   Procedure: ESOPHAGOGASTRODUODENOSCOPY (EGD) WITH PROPOFOL;  Surgeon: Meridee Score Netty Starring., MD;  [facility];  Service: Gastroenterology;  Laterality: N/A;   ESOPHAGOGASTRODUODENOSCOPY (EGD) WITH PROPOFOL N/A 01/06/2023   Procedure: ESOPHAGOGASTRODUODENOSCOPY (EGD) WITH PROPOFOL;  Surgeon: Meridee Score Netty Starring., MD;  [facility];  Service: Gastroenterology;  Laterality: N/A;   EUS N/A 11/27/2022   Procedure: UPPER ENDOSCOPIC ULTRASOUND (EUS) LINEAR;  Surgeon: Lemar Lofty., MD;  [facility]: [facility];  Service: Gastroenterology;  Laterality: N/A;   HERNIA REPAIR Left 2010   inguinal   PANCREATIC STENT PLACEMENT  11/27/2022   Procedure: PANCREATIC STENT PLACEMENT;  Surgeon: Lemar Lofty., MD;  Location: [facility];  Service: Gastroenterology;;   POLYPECTOMY  03/11/2017   Procedure: POLYPECTOMY;  Surgeon: Malissa Hippo, MD;  [facility];  Service: Endoscopy;;  colon   POLYPECTOMY  04/24/2022   Procedure: POLYPECTOMY;  Surgeon: Malissa Hippo, MD;  [facility];  Service: Endoscopy;;   ROTATOR CUFF REPAIR Right 2014   STENT REMOVAL  01/20/2022   Procedure: STENT REMOVAL;  Surgeon: Lemar Lofty., MD;  Location: [facility];  Service: Gastroenterology;;   Francine Graven REMOVAL  01/06/2023   Procedure: STENT REMOVAL;  Surgeon: Lemar Lofty., MD;  Location: [facility];  Service: Gastroenterology;;   UPPER ESOPHAGEAL ENDOSCOPIC ULTRASOUND (EUS) N/A 12/09/2021   Procedure: UPPER ESOPHAGEAL ENDOSCOPIC ULTRASOUND (EUS);  Surgeon: Lemar Lofty., MD;  Location: [facility];  Service: Gastroenterology;  Laterality: N/A;   vericous veins  2001   Social History   Socioeconomic History    Marital status: Divorced    Spouse name: Not on file   Number of children: 2   Years of education: 16   Highest education level: Not on file  Occupational History    Comment: retired  Tobacco Use   Smoking status: Never   Smokeless tobacco: Never  Vaping Use   Vaping status: Never Used  Substance and Sexual Activity   Alcohol use: Not Currently   Drug use: No   Sexual activity: Not on file  Other Topics Concern   Not on file  Social History Narrative   Lives with sig other   caffeine-  A little   Social Determinants of Corporate investment banker Strain: Not on file  Food Insecurity: Not on file  Transportation Needs: Not on file  Physical Activity: Not on file  Stress: Not on file  Social Connections: Not on file  Intimate Partner Violence: Not on file   Family History  Problem Relation Age of Onset   Stroke Mother    Heart disease Father    Clotting disorder Father    Dementia Sister    Thyroid disease Sister    Hypertension Sister    Colon cancer Neg Hx    Esophageal cancer Neg Hx    Rectal cancer Neg Hx    Inflammatory bowel disease Neg Hx    Liver disease Neg Hx    Pancreatic cancer Neg Hx    Stomach cancer Neg Hx    I have reviewed his medical, social, and family history in detail and updated the electronic medical record as necessary.    PHYSICAL EXAMINATION  There were no vitals taken for this visit. Wt Readings from Last 3 Encounters:  07/24/23 179 lb 3.2 oz (81.3 kg)  05/11/23 178 lb 1.6 oz (80.8 kg)  01/06/23 170 lb (77.1 kg)  Telemedicine visit   REVIEW OF DATA  I reviewed the following data at the time of this encounter:  GI Procedures and Studies  January 2024 EGD - No gross lesions in the entire esophagus. Non-obstructing Schatzki ring. Z-line irregular, 38 cm from the incisors. - 2 cm hiatal hernia. - Previously placed AXIOS cystgastrostomy stent and double-pigtail stent were noted in the cystgastrostomy tract. These were removed. The  cyst cavity shows viable tissue. Mild oozing noted after stents were removed but this slowed by time of completion of procedure. - No other gross lesions in the entire stomach. - No gross lesions in the duodenal bulb, in the first portion of the duodenum and in the second portion of the duodenum.  December 2023 EUS EGD Impression: - No gross lesions in the entire esophagus. Z-line irregular, 40 cm from the incisors. - Extrinsic impression of the gastric body/antrum. - No other gross lesions in the entire stomach. - Congested duodenal mucosa. - Erythematous and congested duodenopathy. Biopsied. EUS Impression: - A cystic lesion was seen in the pancreatic head/genu/body. Tissue has not been obtained. However, the endosonographic appearance is consistent with a pancreatic pseudocyst. AXIOS cystgastrostomy created. Fluid evacuated via suction. Dilated AXIOS tract. Single double pigtail placed into the pseudocyst cavity through the AXIOS.  Laboratory Studies  Reviewed those in epic and care everywhere  Imaging Studies  September 2024 MRCP IMPRESSION: 3.7 cm pseudocyst along the proximal pancreatic tail, mildly progressive from recent prior. Associated dilated distal pancreatic duct with side  branch ectasia, measuring up to 6 mm, favoring sequela of prior pancreatitis but progressive from recent prior. Cholelithiasis, without associated inflammatory changes.  June 2024 MRCP IMPRESSION: 1. Pseudocyst of the pancreatic neck and proximal body measuring 2.7 x 1.8 cm. A previously seen cystgastrostomy tube has been removed and pseudocyst is markedly diminished in size compared to prior examination dated 11/20/2022. Chronic atrophy of the distal pancreatic body and tail with pancreatic ductal prominence measuring up to 0.4 cm. 2. The central common bile duct is effaced within the pancreatic head, appearance similar to prior examination. No biliary dilatation. 3. No acute inflammatory findings. 4.  Small gallstones. No gallbladder wall thickening, or biliary dilatation. 5. Moderate burden of stool.  January 2024 CT abdomen pelvis with contrast IMPRESSION: 1. Interval placement of cysto gastrostomy stent with resolution of the pancreatic pseudocyst and residual mild peripancreatic stranding. No new fluid collections. Upstream pancreatic ductal dilation in the body/tail measuring up to 5 mm. 2. Aortic Atherosclerosis (ICD10-I70.0).   ASSESSMENT  [name] is a 72 y.o. male with a pmh significant for CAD (status post CABG), hypertension, hyperlipidemia, colon polyps, GERD, recent complicated necrotizing pancreatitis (etiology unclear drug-induced from Lyrica versus microlithiasis (no cholelithiasis on imaging) and very minimal alcohol consumption) with walled off necrosis/peripancreatic fluid collection development (s/p 2 previous cystgastrostomies) and now with persisting pseudocyst (enlarging).  The patient is seen today for evaluation and management of:  1. Pancreatic pseudocyst   2. Pancreatic duct disruption    The patient is hemodynamically and clinically stable.  Thankfully he seems to be still be doing quite well.  What is unfortunate is that it seems that he has had a recurrence of his pseudocyst status post his most recent cystgastrostomy.  As well and has slowly enlarged over the course of the last 8 months.  This to me is most suggestive of recurrent pseudocyst as a result of a pancreatic duct disruption.  Thankfully he has not developed as large of a cyst as he did last year when we had to access it.  I think he benefits from an attempt at pancreatic ERCP.  However we know the risks of ERCP are increased in this population subset.  The risks of an ERCP were discussed at length, including but not limited to the risk of perforation, bleeding, abdominal pain, post-ERCP pancreatitis (while usually mild can be severe and even life threatening).  The patient is wondering if this could  still be monitored or surveilled, and I do not think that is an unreasonable approach, but I expect that if we were to monitor this, it likely would show continued increase in size where in which pancreatic duct disruption again still feels the most likely etiology since he has not had a true bout of recurrent pancreatitis.  At this point the patient agrees to moving forward with an MRI/MRCP.  He is going to think about this over the course of the next week or 2 and give [place] a decision as to whether he would want to move forward with pancreatic ERCP versus just continuing to monitor with surveillance imaging and then decide about pancreatic ERCP in the future.  If we do move forward with an ERCP I likely will evaluate with an endoscopic ultrasound as well.  All patient questions were answered to the best of my ability, and the patient agrees to the aforementioned plan of action with follow-up as indicated.   PLAN  Proceed with scheduling MRI/MRCP for surveillance and monitoring of the pseudocyst to  see if it continues to enlarge Patient wants to hold on pancreatic ERCP scheduling for now Patient will update [place] if he changes his mind and would like to move forward with pancreatic ERCP attempt At same time if we do schedule an ERCP, then would also do with EUS Will need to consider irbesartan transition if any other episodes of pancreatitis develop   No orders of the defined types were placed in this encounter.   New Prescriptions   No medications on file   Modified Medications   No medications on file    Planned Follow Up No follow-ups on file.   Total Time in Face-to-Face and in Coordination of Care for patient including independent/personal interpretation/review of prior testing, medical history, examination, medication adjustment, imaging evaluation with patient and family, communicating results with the patient directly, and documentation within the EHR is 25 minutes.   Corliss Parish, MD [facility] Office # [phone]

## 2023-09-03 NOTE — Telephone Encounter (Signed)
Inbound call from patient stating he spoke with Dr. Meridee Score yesterday regarding scheduling an appointment. Patient requesting a call back. Please advise, thank you

## 2023-09-04 ENCOUNTER — Other Ambulatory Visit: Payer: Self-pay

## 2023-09-04 DIAGNOSIS — K862 Cyst of pancreas: Secondary | ICD-10-CM

## 2023-09-04 DIAGNOSIS — K863 Pseudocyst of pancreas: Secondary | ICD-10-CM

## 2023-09-04 DIAGNOSIS — K8689 Other specified diseases of pancreas: Secondary | ICD-10-CM

## 2023-09-04 NOTE — Telephone Encounter (Addendum)
FYI-Dr Mansouraty. Patient has decided to proceed with ERCP/EUS vs MRI/MRCP. Patient scheduled for 10/27/23.

## 2023-09-04 NOTE — Telephone Encounter (Signed)
Ro is this something you were working on from yesterday?

## 2023-09-04 NOTE — Addendum Note (Signed)
Addended byLucky Rathke on: 09/04/2023 10:59 AM   Modules accepted: Orders

## 2023-09-04 NOTE — Telephone Encounter (Addendum)
Returned call to patient. He has decided that he would like to proceed with EUS/ERCP vs MRI/MCRP. Patient  has been scheduled for 10/27/23 @ [facility]. All instructions will be sent to patient via my-chart and by mail. Patient voiced understanding and will let me know if he has any further questions.

## 2023-09-04 NOTE — Addendum Note (Signed)
Addended byLucky Rathke on: 09/04/2023 12:21 PM   Modules accepted: Orders

## 2023-09-06 NOTE — Telephone Encounter (Signed)
Thank you for this update. EUS to evaluate the cyst and possibly aspirate first redrain with cystgastrostomy +/- pancreatic ERCP for attempted evaluation of disrupted duct. GM

## 2023-09-24 DIAGNOSIS — Z23 Encounter for immunization: Secondary | ICD-10-CM | POA: Diagnosis not present

## 2023-10-19 ENCOUNTER — Other Ambulatory Visit: Payer: Self-pay

## 2023-10-19 ENCOUNTER — Encounter (HOSPITAL_COMMUNITY): Payer: Self-pay | Admitting: Gastroenterology

## 2023-10-20 ENCOUNTER — Ambulatory Visit: Payer: PPO

## 2023-10-20 ENCOUNTER — Ambulatory Visit: Payer: PPO | Attending: Cardiology | Admitting: Cardiology

## 2023-10-20 ENCOUNTER — Encounter: Payer: Self-pay | Admitting: Cardiology

## 2023-10-20 VITALS — BP 130/84 | HR 65 | Ht 68.0 in | Wt 179.8 lb

## 2023-10-20 DIAGNOSIS — R002 Palpitations: Secondary | ICD-10-CM | POA: Diagnosis not present

## 2023-10-20 DIAGNOSIS — E782 Mixed hyperlipidemia: Secondary | ICD-10-CM

## 2023-10-20 DIAGNOSIS — T466X5D Adverse effect of antihyperlipidemic and antiarteriosclerotic drugs, subsequent encounter: Secondary | ICD-10-CM | POA: Diagnosis not present

## 2023-10-20 DIAGNOSIS — I25119 Atherosclerotic heart disease of native coronary artery with unspecified angina pectoris: Secondary | ICD-10-CM | POA: Diagnosis not present

## 2023-10-20 DIAGNOSIS — I10 Essential (primary) hypertension: Secondary | ICD-10-CM | POA: Diagnosis not present

## 2023-10-20 DIAGNOSIS — R42 Dizziness and giddiness: Secondary | ICD-10-CM | POA: Diagnosis not present

## 2023-10-20 DIAGNOSIS — M791 Myalgia, unspecified site: Secondary | ICD-10-CM

## 2023-10-20 NOTE — Progress Notes (Signed)
Cardiology Office Note  Date: 10/20/2023   ID: [name], DOB 11/23/1951, MRN [id number]  History of Present Illness: [name] is a 72 y.o. male last seen in October 2023.  He is here for a routine visit.  Reports no angina or change in stamina.  Plays golf and walks 3 days a week, also active with outside chores.  He does not report any syncope.  Does mention a sense of "fluttering" sometimes when he is still at nighttime and on his left side.  This is generally brief.  He does not feel palpitations during the daytime.  Has had some intermittent lightheadedness.  We discussed getting a cardiac monitor for further investigation.  He does not have any prior history of cardiac arrhythmia.  We did take him off beta-blocker last year given resting bradycardia.  I reviewed his medications, cardiac regimen includes aspirin, Repatha, and Avapro.  His LDL was 47 in August of this year.  He has a pancreatic pseudocyst that has required drainage in the past.  Scheduled for ERCP next week.  ECG today shows sinus rhythm with leftward axis, probable old anterior infarct pattern, PVCs.  Physical Exam: VS:  BP 130/84 (BP Location: Right Arm, Patient Position: Sitting, Cuff Size: Normal)   Pulse 65   Ht 5\' 8"  (1.727 m)   Wt 179 lb 12.8 oz (81.6 kg)   SpO2 98%   BMI 27.34 kg/m , BMI Body mass index is 27.34 kg/m.  Wt Readings from Last 3 Encounters:  10/20/23 179 lb 12.8 oz (81.6 kg)  07/24/23 179 lb 3.2 oz (81.3 kg)  05/11/23 178 lb 1.6 oz (80.8 kg)    General: Patient appears comfortable at rest. HEENT: Conjunctiva and lids normal. Neck: Supple, no elevated JVP or carotid bruits. Lungs: Clear to auscultation, nonlabored breathing at rest. Cardiac: Regular rate and rhythm, no S3 or significant systolic murmur. Extremities: No pitting edema.  ECG:  An ECG dated 11/28/2022 was personally reviewed today and demonstrated:  This tachycardia with left anterior fascicular block and poor R  wave progression, rule out old anterior infarct pattern.  Labwork: 05/12/2023: ALT 22; AST 34; Hemoglobin 15.5; Platelets 193 05/13/2023: BUN 19; Creatinine, Ser 1.06; Potassium 4.4; Sodium 141     Component Value Date/Time   CHOL 94 (L) 07/17/2022 0744   TRIG 106 07/17/2022 0744   HDL 37 (L) 07/17/2022 0744   CHOLHDL 2.5 07/17/2022 0744   CHOLHDL 2.9 08/07/2021 1744   VLDL 22 08/07/2021 1744   LDLCALC 37 07/17/2022 0744  August 2024: LP(a) less than 8.4  Other Studies Reviewed Today:  No interval cardiac testing for review today.  Assessment and Plan:  1.  Multivessel CAD status post CABG in 2014 [facility]) with LIMA to LAD and SVG to first diagonal.  LVEF 60 to 65% by echocardiogram in 2020.  He remains asymptomatic, no active angina on medical therapy and no change in stamina with regular exercise.  Continue aspirin and Repatha.  2.  Primary hypertension.  Continue Avapro, no change in dose today.  3.  Mixed hyperlipidemia with intolerance to statins and Zetia.  He follows in the lipid clinic, saw Dr. Rennis Golden in August and continues to do well on Repatha.  Last LDL 47.  4.  Intermittent sense of fluttering and lightheadedness as discussed above, no syncope.  Plan on 7-day Zio patch for investigation of potential arrhythmia.  5.  Pancreatic pseudocyst with plan for ERCP next week.  No cardiac contraindication to proceed at  this point.  Disposition:  Follow up  1 year, sooner if needed.  Signed, Jonelle Sidle, M.D., F.A.C.C. [facility]

## 2023-10-20 NOTE — Patient Instructions (Signed)
Medication Instructions:  Your physician recommends that you continue on your current medications as directed. Please refer to the Current Medication list given to you today.    Labwork: None today  Testing/Procedures: ZIO XT- Long Term Monitor Instructions   Your physician has requested you wear your ZIO patch monitor_____7__days.   This is a single patch monitor.  [employer] supplies one patch monitor per enrollment.  Additional stickers are not available.   Please do not apply patch if you will be having a Nuclear Stress Test, Echocardiogram, Cardiac CT, MRI, or Chest Xray during the time frame you would be wearing the monitor. The patch cannot be worn during these tests.  You cannot remove and re-apply the ZIO XT patch monitor.   Your ZIO patch monitor will be sent USPS Priority mail from [facility] directly to your home address. The monitor may also be mailed to a PO BOX if home delivery is not available.   It may take 3-5 days to receive your monitor after you have been enrolled.   Once you have received you monitor, please review enclosed instructions.  Your monitor has already been registered assigning a specific monitor serial # to you.   Applying the monitor   Shave hair from upper left chest.   Hold abrader disc by orange tab.  Rub abrader in 40 strokes over left upper chest as indicated in your monitor instructions.   Clean area with 4 enclosed alcohol pads .  Use all pads to assure are is cleaned thoroughly.  Let dry.   Apply patch as indicated in monitor instructions.  Patch will be place under collarbone on left side of chest with arrow pointing upward.   Rub patch adhesive wings for 2 minutes.Remove white label marked "1".  Remove white label marked "2".  Rub patch adhesive wings for 2 additional minutes.   While looking in a mirror, press and release button in center of patch.  A small green light will flash 3-4 times .  This will be your only indicator the  monitor has been turned on.     Do not shower for the first 24 hours.  You may shower after the first 24 hours.   Press button if you feel a symptom. You will hear a small click.  Record Date, Time and Symptom in the Patient Log Book.   When you are ready to remove patch, follow instructions on last 2 pages of Patient Log Book.  Stick patch monitor onto last page of Patient Log Book.   Place Patient Log Book in [place] box.  Use locking tab on box and tape box closed securely.  The [employer] has [employer] on it.  Please place in mailbox as soon as possible.  Your physician should have your test results approximately 7 days after the monitor has been mailed back to [facility].   Call [facility] Customer Care at [phone] if you have questions regarding your ZIO XT patch monitor.  Call them immediately if you see an orange light blinking on your monitor.   If your monitor falls off in less than 4 days contact our Monitor department at [phone].  If your monitor becomes loose or falls off after 4 days call [employer] at [phone] for suggestions on securing your monitor.    Follow-Up: 1 year  Any Other Special Instructions Will Be Listed Below (If Applicable).  If you need a refill on your cardiac medications before your next appointment, please call  your pharmacy.

## 2023-10-26 NOTE — Anesthesia Preprocedure Evaluation (Signed)
Anesthesia Evaluation  Patient identified by MRN, date of birth, ID band Patient awake    Reviewed: Allergy & Precautions, H&P , NPO status   Airway Mallampati: II   Neck ROM: full    Dental   Pulmonary neg pulmonary ROS   breath sounds clear to auscultation       Cardiovascular hypertension, + CAD and + CABG   Rhythm:regular Rate:Normal  ECHO Normal pumping function with mild aortic root enlargemen   Neuro/Psych  Neuromuscular disease    GI/Hepatic   Endo/Other    Renal/GU      Musculoskeletal   Abdominal   Peds  Hematology  (+) Blood dyscrasia, anemia   Anesthesia Other Findings   Reproductive/Obstetrics                              Anesthesia Physical Anesthesia Plan  ASA: 3  Anesthesia Plan: General   Post-op Pain Management: Minimal or no pain anticipated   Induction: Intravenous  PONV Risk Score and Plan: 1 and Ondansetron, Dexamethasone and Treatment may vary due to age or medical condition  Airway Management Planned: Oral ETT  Additional Equipment: None  Intra-op Plan:   Post-operative Plan: Extubation in OR  Informed Consent: I have reviewed the patients History and Physical, chart, labs and discussed the procedure including the risks, benefits and alternatives for the proposed anesthesia with the patient or authorized representative who has indicated his/her understanding and acceptance.     Dental advisory given  Plan Discussed with: CRNA and Anesthesiologist  Anesthesia Plan Comments: (  )        Anesthesia Quick Evaluation

## 2023-10-27 ENCOUNTER — Ambulatory Visit (HOSPITAL_COMMUNITY): Payer: PPO

## 2023-10-27 ENCOUNTER — Ambulatory Visit (HOSPITAL_COMMUNITY)
Admission: RE | Admit: 2023-10-27 | Discharge: 2023-10-27 | Disposition: A | Discharge status: Home / Self Care | Payer: PPO | Attending: Gastroenterology | Admitting: Gastroenterology

## 2023-10-27 ENCOUNTER — Other Ambulatory Visit: Payer: Self-pay

## 2023-10-27 ENCOUNTER — Ambulatory Visit (HOSPITAL_COMMUNITY): Payer: Self-pay | Admitting: Anesthesiology

## 2023-10-27 ENCOUNTER — Encounter (HOSPITAL_COMMUNITY)
Admission: RE | Disposition: A | Discharge status: Home / Self Care | Payer: Self-pay | Source: Home / Self Care | Attending: Gastroenterology

## 2023-10-27 ENCOUNTER — Telehealth: Payer: Self-pay

## 2023-10-27 ENCOUNTER — Ambulatory Visit (HOSPITAL_BASED_OUTPATIENT_CLINIC_OR_DEPARTMENT_OTHER): Payer: PPO | Admitting: Anesthesiology

## 2023-10-27 ENCOUNTER — Encounter (HOSPITAL_COMMUNITY): Payer: Self-pay | Admitting: Gastroenterology

## 2023-10-27 DIAGNOSIS — Z951 Presence of aortocoronary bypass graft: Secondary | ICD-10-CM | POA: Diagnosis not present

## 2023-10-27 DIAGNOSIS — K862 Cyst of pancreas: Secondary | ICD-10-CM | POA: Insufficient documentation

## 2023-10-27 DIAGNOSIS — I2510 Atherosclerotic heart disease of native coronary artery without angina pectoris: Secondary | ICD-10-CM | POA: Insufficient documentation

## 2023-10-27 DIAGNOSIS — K838 Other specified diseases of biliary tract: Secondary | ICD-10-CM

## 2023-10-27 DIAGNOSIS — Z7982 Long term (current) use of aspirin: Secondary | ICD-10-CM | POA: Insufficient documentation

## 2023-10-27 DIAGNOSIS — I10 Essential (primary) hypertension: Secondary | ICD-10-CM | POA: Insufficient documentation

## 2023-10-27 DIAGNOSIS — K863 Pseudocyst of pancreas: Secondary | ICD-10-CM

## 2023-10-27 DIAGNOSIS — K8689 Other specified diseases of pancreas: Secondary | ICD-10-CM | POA: Diagnosis not present

## 2023-10-27 DIAGNOSIS — K831 Obstruction of bile duct: Secondary | ICD-10-CM | POA: Insufficient documentation

## 2023-10-27 DIAGNOSIS — T85528A Displacement of other gastrointestinal prosthetic devices, implants and grafts, initial encounter: Secondary | ICD-10-CM

## 2023-10-27 DIAGNOSIS — E119 Type 2 diabetes mellitus without complications: Secondary | ICD-10-CM | POA: Diagnosis not present

## 2023-10-27 DIAGNOSIS — K2289 Other specified disease of esophagus: Secondary | ICD-10-CM | POA: Diagnosis not present

## 2023-10-27 DIAGNOSIS — K859 Acute pancreatitis without necrosis or infection, unspecified: Secondary | ICD-10-CM | POA: Diagnosis not present

## 2023-10-27 DIAGNOSIS — K3189 Other diseases of stomach and duodenum: Secondary | ICD-10-CM | POA: Diagnosis not present

## 2023-10-27 HISTORY — PX: ERCP: SHX5425

## 2023-10-27 HISTORY — PX: PANCREATIC STENT PLACEMENT: SHX5539

## 2023-10-27 HISTORY — PX: UPPER ESOPHAGEAL ENDOSCOPIC ULTRASOUND (EUS): SHX6562

## 2023-10-27 HISTORY — PX: ESOPHAGOGASTRODUODENOSCOPY: SHX5428

## 2023-10-27 HISTORY — PX: REMOVAL OF STONES: SHX5545

## 2023-10-27 HISTORY — PX: SPHINCTEROTOMY: SHX5279

## 2023-10-27 HISTORY — PX: FINE NEEDLE ASPIRATION: SHX5430

## 2023-10-27 LAB — COMPREHENSIVE METABOLIC PANEL
ALT: 23 U/L (ref 0–44)
AST: 19 U/L (ref 15–41)
Albumin: 4 g/dL (ref 3.5–5.0)
Alkaline Phosphatase: 56 U/L (ref 38–126)
Anion gap: 10 (ref 5–15)
BUN: 22 mg/dL (ref 8–23)
CO2: 22 mmol/L (ref 22–32)
Calcium: 8.5 mg/dL — ABNORMAL LOW (ref 8.9–10.3)
Chloride: 106 mmol/L (ref 98–111)
Creatinine, Ser: 1.21 mg/dL (ref 0.61–1.24)
GFR, Estimated: >60 mL/min (ref >60)
Glucose, Bld: 240 mg/dL — ABNORMAL HIGH (ref 70–99)
Potassium: 4.3 mmol/L (ref 3.5–5.1)
Sodium: 138 mmol/L (ref 135–145)
Total Bilirubin: 0.7 mg/dL (ref <1.2)
Total Protein: 7.1 g/dL (ref 6.5–8.1)

## 2023-10-27 SURGERY — UPPER ESOPHAGEAL ENDOSCOPIC ULTRASOUND (EUS)
Anesthesia: General

## 2023-10-27 MED ORDER — SUGAMMADEX SODIUM 200 MG/2ML IV SOLN
INTRAVENOUS | Status: DC | PRN
  Administered 2023-10-27: 200 mg via INTRAVENOUS

## 2023-10-27 MED ORDER — PROPOFOL 10 MG/ML IV BOLUS
INTRAVENOUS | Status: AC
  Filled 2023-10-27: qty 20

## 2023-10-27 MED ORDER — CIPROFLOXACIN IN D5W 400 MG/200ML IV SOLN
INTRAVENOUS | Status: AC
Start: 2023-10-27 — End: ?
  Filled 2023-10-27: qty 200

## 2023-10-27 MED ORDER — DEXAMETHASONE SODIUM PHOSPHATE 4 MG/ML IJ SOLN
INTRAMUSCULAR | Status: DC | PRN
  Administered 2023-10-27: 5 mg via INTRAVENOUS

## 2023-10-27 MED ORDER — CIPROFLOXACIN IN D5W 400 MG/200ML IV SOLN
INTRAVENOUS | Status: DC | PRN
  Administered 2023-10-27: 400 mg via INTRAVENOUS

## 2023-10-27 MED ORDER — PHENYLEPHRINE HCL (PRESSORS) 10 MG/ML IV SOLN
INTRAVENOUS | Status: DC | PRN
  Administered 2023-10-27 (×3): 200 ug via INTRAVENOUS
  Administered 2023-10-27: 400 ug via INTRAVENOUS
  Administered 2023-10-27: 200 ug via INTRAVENOUS

## 2023-10-27 MED ORDER — DICLOFENAC SUPPOSITORY 100 MG
RECTAL | Status: DC | PRN
  Administered 2023-10-27: 100 mg via RECTAL

## 2023-10-27 MED ORDER — ROCURONIUM BROMIDE 100 MG/10ML IV SOLN
INTRAVENOUS | Status: DC | PRN
  Administered 2023-10-27: 10 mg via INTRAVENOUS
  Administered 2023-10-27: 50 mg via INTRAVENOUS

## 2023-10-27 MED ORDER — PROPOFOL 10 MG/ML IV BOLUS
INTRAVENOUS | Status: DC | PRN
  Administered 2023-10-27: 150 mg via INTRAVENOUS

## 2023-10-27 MED ORDER — SODIUM CHLORIDE 0.9 % IV SOLN: INTRAVENOUS | Status: DC | PRN

## 2023-10-27 MED ORDER — GLUCAGON HCL RDNA (DIAGNOSTIC) 1 MG IJ SOLR
INTRAMUSCULAR | Status: AC
Start: 2023-10-27 — End: ?
  Filled 2023-10-27: qty 2

## 2023-10-27 MED ORDER — DICLOFENAC SUPPOSITORY 100 MG
RECTAL | Status: AC
  Filled 2023-10-27: qty 1

## 2023-10-27 MED ORDER — ONDANSETRON HCL 4 MG/2ML IJ SOLN
INTRAMUSCULAR | Status: DC | PRN
  Administered 2023-10-27: 4 mg via INTRAVENOUS

## 2023-10-27 MED ORDER — OXYCODONE HCL 5 MG PO TABS: 5.0000 mg | ORAL_TABLET | Freq: Four times a day (QID) | ORAL | 0 refills | Status: AC | PRN

## 2023-10-27 MED ORDER — VASOPRESSIN 20 UNIT/ML IV SOLN
INTRAVENOUS | Status: DC | PRN
  Administered 2023-10-27 (×3): 1 [IU] via INTRAVENOUS

## 2023-10-27 MED ORDER — LIDOCAINE HCL (CARDIAC) PF 100 MG/5ML IV SOSY
PREFILLED_SYRINGE | INTRAVENOUS | Status: DC | PRN
  Administered 2023-10-27: 80 mg via INTRAVENOUS

## 2023-10-27 MED ORDER — SODIUM CHLORIDE 0.9 % IV SOLN
INTRAVENOUS | Status: DC
Start: 2023-10-27 — End: 2023-10-27

## 2023-10-27 MED ORDER — VASOPRESSIN 20 UNIT/ML IV SOLN
INTRAVENOUS | Status: AC
  Filled 2023-10-27: qty 1

## 2023-10-27 MED ORDER — FENTANYL CITRATE (PF) 100 MCG/2ML IJ SOLN
INTRAMUSCULAR | Status: AC
  Filled 2023-10-27: qty 2

## 2023-10-27 MED ORDER — GLUCAGON HCL RDNA (DIAGNOSTIC) 1 MG IJ SOLR
INTRAMUSCULAR | Status: DC | PRN
  Administered 2023-10-27 (×3): .25 mg via INTRAVENOUS

## 2023-10-27 MED ORDER — CIPROFLOXACIN HCL 500 MG PO TABS: 500.0000 mg | ORAL_TABLET | Freq: Two times a day (BID) | ORAL | 0 refills | Status: AC

## 2023-10-27 MED ORDER — PROPOFOL 500 MG/50ML IV EMUL
INTRAVENOUS | Status: DC | PRN
  Administered 2023-10-27: 50 ug/kg/min via INTRAVENOUS

## 2023-10-27 MED ORDER — SODIUM CHLORIDE 0.9 % IV SOLN
INTRAVENOUS | Status: DC | PRN
  Administered 2023-10-27: 20 mL

## 2023-10-27 MED ORDER — FENTANYL CITRATE (PF) 100 MCG/2ML IJ SOLN
INTRAMUSCULAR | Status: DC | PRN
  Administered 2023-10-27: 50 ug via INTRAVENOUS

## 2023-10-27 NOTE — Anesthesia Procedure Notes (Signed)
Procedure Name: Intubation Date/Time: 10/27/2023 9:37 AM  Performed by: Deri Fuelling, CRNAPre-anesthesia Checklist: Patient identified, Emergency Drugs available, Suction available and Patient being monitored Patient Re-evaluated:Patient Re-evaluated prior to induction Oxygen Delivery Method: Circle system utilized Preoxygenation: Pre-oxygenation with 100% oxygen Induction Type: IV induction Ventilation: Mask ventilation without difficulty Laryngoscope Size: Mac and 4 Grade View: Grade I Tube type: Oral Tube size: 7.5 mm Number of attempts: 1 Airway Equipment and Method: Stylet and Oral airway Placement Confirmation: ETT inserted through vocal cords under direct vision, positive ETCO2 and breath sounds checked- equal and bilateral Secured at: 22 cm Tube secured with: Tape Dental Injury: Teeth and Oropharynx as per pre-operative assessment

## 2023-10-27 NOTE — Telephone Encounter (Signed)
-----   Message from [facility] sent at 10/27/2023 11:45 AM EST ----- Regarding: Followup [name], This patient needs a 2-week KUB to follow-up pancreatic stent make sure that is fallen out.  Put that as an urgent read.  Thanks. GM

## 2023-10-27 NOTE — Discharge Instructions (Signed)

## 2023-10-27 NOTE — Anesthesia Postprocedure Evaluation (Signed)
Anesthesia Post Note  Patient: [name]  Procedure(s) Performed: UPPER ESOPHAGEAL ENDOSCOPIC ULTRASOUND (EUS) ENDOSCOPIC RETROGRADE CHOLANGIOPANCREATOGRAPHY (ERCP) ESOPHAGOGASTRODUODENOSCOPY (EGD) FINE NEEDLE ASPIRATION (FNA) LINEAR SPHINCTEROTOMY PANCREATIC STENT PLACEMENT REMOVAL OF SLUDGE     Patient location during evaluation: PACU Anesthesia Type: General Level of consciousness: awake and alert Pain management: pain level controlled Vital Signs Assessment: post-procedure vital signs reviewed and stable Respiratory status: spontaneous breathing, nonlabored ventilation, respiratory function stable and patient connected to nasal cannula oxygen Cardiovascular status: blood pressure returned to baseline and stable Postop Assessment: no apparent nausea or vomiting Anesthetic complications: no   No notable events documented.  Last Vitals:  Vitals:   10/27/23 1138 10/27/23 1148  BP: (!) 109/59 115/60  Pulse: 60 63  Resp: 13 14  Temp:    SpO2: 96% 95%    Last Pain:  Vitals:   10/27/23 1148  TempSrc:   PainSc: 0-No pain                 Regana Kemple

## 2023-10-27 NOTE — Telephone Encounter (Signed)
KUB has been ordered with urgent read entered.  Will call and remind pt in 2 weeks.

## 2023-10-27 NOTE — Transfer of Care (Signed)
Immediate Anesthesia Transfer of Care Note  Patient: Jakim Drapeau  Procedure(s) Performed: UPPER ESOPHAGEAL ENDOSCOPIC ULTRASOUND (EUS) ENDOSCOPIC RETROGRADE CHOLANGIOPANCREATOGRAPHY (ERCP) ESOPHAGOGASTRODUODENOSCOPY (EGD) FINE NEEDLE ASPIRATION (FNA) LINEAR SPHINCTEROTOMY PANCREATIC STENT PLACEMENT REMOVAL OF SLUDGE  Patient Location: PACU  Anesthesia Type:General  Level of Consciousness: awake and alert   Airway & Oxygen Therapy: Patient Spontanous Breathing and Patient connected to face mask oxygen  Post-op Assessment: Report given to RN and Post -op Vital signs reviewed and stable  Post vital signs: Reviewed and stable  Last Vitals:  Vitals Value Taken Time  BP 105/49 10/27/23 1130  Temp 36.6 C 10/27/23 1128  Pulse 59 10/27/23 1130  Resp 14 10/27/23 1130  SpO2 95 % 10/27/23 1130  Vitals shown include unfiled device data.  Last Pain:  Vitals:   10/27/23 1128  TempSrc: Tympanic  PainSc: Asleep         Complications: No notable events documented.

## 2023-10-27 NOTE — Op Note (Signed)
[facility] Patient Name: [name] Procedure Date: 10/27/2023 MRN: [id number] Attending MD: Corliss Parish , MD, [id number] Date of Birth: 28-Mar-1951 CSN: [id number] Age: 72 Admit Type: Outpatient Procedure:                ERCP Indications:              Pancreatic duct leak, Pancreatic duct stricture,                            Pancreatitis on magnetic resonance                            cholangiopancreatography, Pancreatic cyst(s) on                            magnetic resonance cholangiopancreatography,                            Pancreatic duct dilatation on magnetic resonance                            cholangiopancreatography Providers:                Corliss Parish, MD, Norman Clay, RN, Priscella Mann, Technician Referring MD:             Katrinka Blazing Medicines:                General Anesthesia, Diclofenac 100 mg rectal,                            Glucagon 0.75 mg IV, Antibiotics given during EUS                            portion Complications:            No immediate complications. Estimated Blood Loss:     Estimated blood loss was minimal. Procedure:                Pre-Anesthesia Assessment:                           - Prior to the procedure, a History and Physical                            was performed, and patient medications and                            allergies were reviewed. The patient's tolerance of                            previous anesthesia was also reviewed. The risks                            and benefits of the procedure and the sedation  options and risks were discussed with the patient.                            All questions were answered, and informed consent                            was obtained. Prior Anticoagulants: The patient has                            taken no anticoagulant or antiplatelet agents                            except for aspirin. ASA Grade  Assessment: III - A                            patient with severe systemic disease. After                            reviewing the risks and benefits, the patient was                            deemed in satisfactory condition to undergo the                            procedure.                           After obtaining informed consent, the scope was                            passed under direct vision. Throughout the                            procedure, the patient's blood pressure, pulse, and                            oxygen saturations were monitored continuously. The                            TJF-Q190V ([id number]) Olympus duodenoscope was                            introduced through the mouth, and used to inject                            contrast into and used to inject contrast into the                            bile duct and ventral pancreatic duct. The ERCP was                            technically difficult and complex due to difficulty  passing guidewires through pancreatic ductal                            stenosis. Successful completion of the procedure                            was aided by performing the maneuvers documented                            (below) in this report. The patient tolerated the                            procedure. Scope In: Scope Out: Findings:      The scout film was normal.      The esophagus was successfully intubated under direct vision without       detailed examination of the pharynx, larynx, and associated structures,       and upper GI tract. The major papilla was congested with a large       intraduodenal portion.      Repeated attempts at pancreatic cannulation were not successful while       using a wire-guided approach. This eventually led to placement of the       wire within the biliary duct. Decision was made to pursue a double-wire       approach to try to gain access into the pancreatic duct. The  0.035       Jagwire was left within the biliary duct.      Further attempts at ventral ventral pancreatic duct cannulation were       unsuccessful with the Hydratome sphincterotome and revolution jagtome       sphincterotome, as the pancreatic orifice was too inferior with the       double wire in the biliary tree. Thus we transitioned back after       replacing the sphincter tone and wire into the biliary duct. The       revolution sphincterotome was passed over the guidewire and the bile       duct was then deeply cannulated. Contrast was injected. I personally       interpreted the bile duct images. Ductal flow of contrast was adequate.       Image quality was adequate. Contrast extended to the hepatic ducts.       Opacification of the entire biliary tree except for the gallbladder was       successful. The maximum diameter of the ducts was 9 mm. The lower third       of the main bile duct contained a single mild narrowing 10 mm in length.       A 7 mm biliary sphincterotomy was made with a monofilament Hydratome       sphincterotome using ERBE electrocautery. There was no       post-sphincterotomy bleeding. To discover objects, the biliary tree was       swept with a retrieval balloon. Very small amounts of sludge were swept       from the duct. An occlusion cholangiogram was performed that showed no       further significant biliary pathology. The wire was removed.      Using the revolution Jagtome, a short 0.025 inch revolution Al Pimple was  then passed into the ventral pancreatic duct. I personally interpreted       the pancreatic duct images. Ductal flow of contrast was adequate. Image       quality was adequate. Contrast Contrast extended to the pancreatic duct.       Opacification of the pancreatic duct within the head was noted. The       maximum diameter of the ducts was 1.5 mm. The pancreatic duct in the       genu of the pancreas contained a severe stenosis that could not  be       traversed even with an angled 0.025 inch revolution Jagwire. Upon       further contrast administration, what appeared to be a single disruption       with a small amount of leaked contrast was found in the pancreatic duct       in the genu of the pancreas (where the stricture seems to be). A 4 mm       ventral pancreatic sphincterotomy was made with a monofilament Jagtome       sphincterotome using ERBE electrocautery. There was no       post-sphincterotomy bleeding. Further attempts to get the revolution       angled and straight Jagwires were unsuccessful. One 4 Fr by 5 cm       temporary plastic pancreatic stent with a single external pigtail was       placed into the ventral pancreatic duct. The stent was in good position       with hope that this will decrease PEEP.      The duodenoscope was withdrawn from the patient. Impression:               - The major papilla appeared congested and had a                            large intraduodenal portion.                           -Initial attempts at pancreatic ductal cannulation                            were unsuccessful leading to wire placement within                            the biliary tree.                           - A single mild biliary narrowing was found in the                            lower third of the main bile duct. This is                            indeterminate, but the patient has no evidence of                            obstruction and I suspect this is more from the  persisting amount of intraduodenal portion of the                            duct that is still present (I could have extended                            sphincterotomy to at least 15 to 20 mm but did not                            feel that that was necessary in this particular                            case as to what our indication was). Small amount                            of biliary sludge removed upon  balloon sweep.                           -After difficulty in obtaining cannulation of the                            pancreatic duct as documented above, we noted what                            appeared to be a stricture within the neck and                            subsequently injected and saw what appeared to be                            some mild contrast extravasation within that area                            (concerning for pancreatic duct disruption into the                            previous cystic cavity).                           - A biliary sphincterotomy was performed.                           - A pancreatic sphincterotomy was performed.                           - Multiple attempts at trying to get deeper                            pancreatic duct cannulation were not successful as                            a result of the likely duct disruption.                           -  One temporary plastic pancreatic stent was placed                            into the ventral pancreatic duct to decrease PEP. Moderate Sedation:      Not Applicable - Patient had care per Anesthesia. Recommendation:           - The patient will be observed post-procedure,                            until all discharge criteria are met.                           - Discharge patient to home.                           - Patient has a contact number available for                            emergencies. The signs and symptoms of potential                            delayed complications were discussed with the                            patient. Return to normal activities tomorrow.                            Written discharge instructions were provided to the                            patient.                           - Low fat diet for 1 week.                           - Observe patient's clinical course.                           - Watch for pancreatitis, bleeding, perforation,                             and cholangitis.                           - Follow-up CMP.                           - Patient will need a KUB 2-view in 10-14 days to                            ensure pancreatic stent has migrated successfully.                            If still present at that time will need to be  scheduled for EGD with stent pull.                           - Pending patient's overall clinical outcome would                            plan repeat cross-sectional imaging in 3 to 4                            months to evaluate if the pancreatic                            cyst/pseudocyst recurs. If it continues to recur                            and enlarges, we can certainly consider the role of                            repeat EUS for cystgastrostomy with double-pigtail                            stent placement when large enough versus sending                            [facility] for 1 more attempt at                            deeper pancreatic duct cannulation or pancreatic                            ductal rendezvous via EUS/ERCP. Other consideration                            would be surgical cystgastrostomy.                           - The findings and recommendations were discussed                            with the patient.                           - The findings and recommendations were discussed                            with the patient's family. Procedure Code(s):        --- Professional ---                           [phone], Endoscopic retrograde                            cholangiopancreatography (ERCP); with placement of  endoscopic stent into biliary or pancreatic duct,                            including pre- and post-dilation and guide wire                            passage, when performed, including sphincterotomy,                            when performed, each stent                            43264, Endoscopic retrograde                            cholangiopancreatography (ERCP); with removal of                            calculi/debris from biliary/pancreatic duct(s)                           43262, 59, Endoscopic retrograde                            cholangiopancreatography (ERCP); with                            sphincterotomy/papillotomy                           74330, 26, Combined endoscopic catheterization of                            the biliary and pancreatic ductal systems,                            radiological supervision and interpretation Diagnosis Code(s):        --- Professional ---                           K83.8, Other specified diseases of biliary tract                           K83.1, Obstruction of bile duct                           K86.89, Other specified diseases of pancreas                           K85.90, Acute pancreatitis without necrosis or                            infection, unspecified                           K86.2, Cyst of pancreas CPT copyright 2022 [employer]. All rights reserved. The codes documented in this report are preliminary and upon coder  review may  be revised to meet current compliance requirements. Corliss Parish, MD 10/27/2023 11:34:46 AM Number of Addenda: 0

## 2023-10-27 NOTE — H&P (Signed)
GASTROENTEROLOGY PROCEDURE H&P NOTE   Primary Care Physician: Carylon Perches, MD  HPI: [name] is a 72 y.o. male who presents for EUS/ERCP for evaluation of pancreatic cyst in setting of presumed recurrency from pancreatic duct disruption.  Past Medical History:  Diagnosis Date   Colon polyps    benign per pt   Coronary artery disease    a. s/p CABG in 2014 with LIMA-LAD and SVG-D1 - performed in [place]   Fracture, ribs    Hyperlipidemia    Hypertension    Pancreatitis    Peripheral neuropathy    Past Surgical History:  Procedure Laterality Date   BALLOON DILATION N/A 12/09/2021   Procedure: BALLOON DILATION;  Surgeon: Mansouraty, Netty Starring., MD;  Location: [facility];  Service: Gastroenterology;  Laterality: N/A;   BALLOON DILATION N/A 11/27/2022   Procedure: BALLOON DILATION;  Surgeon: Meridee Score Netty Starring., MD;  Location: [facility];  Service: Gastroenterology;  Laterality: N/A;   BILIARY STENT PLACEMENT N/A 12/09/2021   Procedure: BILIARY STENT PLACEMENT;  Surgeon: Meridee Score Netty Starring., MD;  Location: [facility];  Service: Gastroenterology;  Laterality: N/A;  double pigtail stents x 2   BIOPSY  12/09/2021   Procedure: BIOPSY;  Surgeon: Meridee Score Netty Starring., MD;  Location: [facility];  Service: Gastroenterology;;   BIOPSY  01/20/2022   Procedure: BIOPSY;  Surgeon: Lemar Lofty., MD;  Location: [facility];  Service: Gastroenterology;;   BIOPSY  11/27/2022   Procedure: BIOPSY;  Surgeon: Lemar Lofty., MD;  Location: [facility];  Service: Gastroenterology;;   CATARACT EXTRACTION W/PHACO Right 02/15/2021   Procedure: CATARACT EXTRACTION PHACO AND INTRAOCULAR LENS PLACEMENT (IOC);  Surgeon: Fabio Pierce, MD;  Location: [facility];  Service: Ophthalmology;  Laterality: Right;  CDE 8.65   CATARACT EXTRACTION W/PHACO Left 03/04/2021   Procedure: CATARACT EXTRACTION PHACO AND INTRAOCULAR LENS PLACEMENT (IOC);  Surgeon: Fabio Pierce, MD;  Location:  [facility];  Service: Ophthalmology;  Laterality: Left;  CDE: 8.15   COLONOSCOPY N/A 03/11/2017   [name]: 2 sessile serrated polyps removed, next colonoscopy in 5 years   COLONOSCOPY WITH PROPOFOL N/A 04/24/2022   Procedure: COLONOSCOPY WITH PROPOFOL;  Surgeon: Malissa Hippo, MD;  [facility]: [facility];  Service: Endoscopy;  Laterality: N/A;  1250   CORONARY ARTERY BYPASS GRAFT  2014   CYST GASTROSTOMY  11/27/2022   Procedure: CYST GASTROSTOMY;  Surgeon: Meridee Score Netty Starring., MD;  Location: [facility];  Service: Gastroenterology;;   DUODENAL STENT PLACEMENT N/A 12/09/2021   Procedure: Christy Sartorius PLACEMENT;  Surgeon: Lemar Lofty., MD;  Location: [facility];  Service: Gastroenterology;  Laterality: N/A;   ESOPHAGOGASTRODUODENOSCOPY N/A 12/09/2021   Procedure: ESOPHAGOGASTRODUODENOSCOPY (EGD);  Surgeon: Lemar Lofty., MD;  Location: [facility];  Service: Gastroenterology;  Laterality: N/A;   ESOPHAGOGASTRODUODENOSCOPY (EGD) WITH PROPOFOL N/A 01/20/2022   Procedure: ESOPHAGOGASTRODUODENOSCOPY (EGD) WITH PROPOFOL;  Surgeon: Meridee Score Netty Starring., MD;  [facility]: [facility];  Service: Gastroenterology;  Laterality: N/A;   ESOPHAGOGASTRODUODENOSCOPY (EGD) WITH PROPOFOL N/A 11/27/2022   Procedure: ESOPHAGOGASTRODUODENOSCOPY (EGD) WITH PROPOFOL;  Surgeon: Meridee Score Netty Starring., MD;  [facility]: [facility];  Service: Gastroenterology;  Laterality: N/A;   ESOPHAGOGASTRODUODENOSCOPY (EGD) WITH PROPOFOL N/A 01/06/2023   Procedure: ESOPHAGOGASTRODUODENOSCOPY (EGD) WITH PROPOFOL;  Surgeon: Meridee Score Netty Starring., MD;  Location: [facility];  Service: Gastroenterology;  Laterality: N/A;   EUS N/A 11/27/2022   Procedure: UPPER ENDOSCOPIC ULTRASOUND (EUS) LINEAR;  Surgeon: Lemar Lofty., MD;  Location: [facility];  Service: Gastroenterology;  Laterality: N/A;   HERNIA REPAIR  Left 2010   inguinal   PANCREATIC STENT PLACEMENT  11/27/2022   Procedure: PANCREATIC STENT  PLACEMENT;  Surgeon: Lemar Lofty., MD;  Location: [facility];  Service: Gastroenterology;;   POLYPECTOMY  03/11/2017   Procedure: POLYPECTOMY;  Surgeon: Malissa Hippo, MD;  [facility];  Service: Endoscopy;;  colon   POLYPECTOMY  04/24/2022   Procedure: POLYPECTOMY;  Surgeon: Malissa Hippo, MD;  [facility];  Service: Endoscopy;;   ROTATOR CUFF REPAIR Right 2014   STENT REMOVAL  01/20/2022   Procedure: STENT REMOVAL;  Surgeon: Lemar Lofty., MD;  Location: [facility];  Service: Gastroenterology;;   Francine Graven REMOVAL  01/06/2023   Procedure: STENT REMOVAL;  Surgeon: Lemar Lofty., MD;  Location: [facility];  Service: Gastroenterology;;   UPPER ESOPHAGEAL ENDOSCOPIC ULTRASOUND (EUS) N/A 12/09/2021   Procedure: UPPER ESOPHAGEAL ENDOSCOPIC ULTRASOUND (EUS);  Surgeon: Lemar Lofty., MD;  Location: [facility];  Service: Gastroenterology;  Laterality: N/A;   vericous veins  2001   No current facility-administered medications for this encounter.   No current facility-administered medications for this encounter. No Known Allergies Family History  Problem Relation Age of Onset   Stroke Mother    Heart disease Father    Clotting disorder Father    Dementia Sister    Thyroid disease Sister    Hypertension Sister    Colon cancer Neg Hx    Esophageal cancer Neg Hx    Rectal cancer Neg Hx    Inflammatory bowel disease Neg Hx    Liver disease Neg Hx    Pancreatic cancer Neg Hx    Stomach cancer Neg Hx    Social History   Socioeconomic History   Marital status: Divorced    Spouse name: Not on file   Number of children: 2   Years of education: 16   Highest education level: Not on file  Occupational History    Comment: retired  Tobacco Use   Smoking status: Never   Smokeless tobacco: Never  Vaping Use   Vaping status: Never Used  Substance and Sexual Activity   Alcohol use: Not Currently   Drug use: No   Sexual  activity: Not on file  Other Topics Concern   Not on file  Social History Narrative   Lives with sig other   caffeine-  A little   Social Determinants of Health   Financial Resource Strain: Not on file  Food Insecurity: Not on file  Transportation Needs: Not on file  Physical Activity: Not on file  Stress: Not on file  Social Connections: Not on file  Intimate Partner Violence: Not on file    Physical Exam: Today's Vitals   10/19/23 0958 10/27/23 0730  BP:  (!) 160/93  Pulse:  62  Resp:  (!) 9  Temp:  97.8 F (36.6 C)  TempSrc:  Temporal  SpO2:  100%  Weight: 78.9 kg 78.5 kg  Height: 5\' 8"  (1.727 m) 5\' 8"  (1.727 m)  PainSc:  0-No pain   Body mass index is 26.3 kg/m. GEN: NAD EYE: Sclerae anicteric ENT: MMM CV: Non-tachycardic GI: Soft, NT/ND NEURO:  Alert & Oriented x 3  Lab Results: No results for input(s): "WBC", "HGB", "HCT", "PLT" in the last 72 hours. BMET No results for input(s): "NA", "K", "CL", "CO2", "GLUCOSE", "BUN", "CREATININE", "CALCIUM" in the last 72 hours. LFT No results for input(s): "PROT", "ALBUMIN", "AST", "ALT", "ALKPHOS", "BILITOT", "BILIDIR", "IBILI" in the last 72 hours. PT/INR No  results for input(s): "LABPROT", "INR" in the last 72 hours.   Impression / Plan: This is a 72 y.o.male who presents for EUS/ERCP for evaluation of pancreatic cyst in setting of presumed recurrency from pancreatic duct disruption.  The risks of an EUS including intestinal perforation, bleeding, infection, aspiration, and medication effects were discussed as was the possibility it may not give a definitive diagnosis if a biopsy is performed.  When a biopsy of the pancreas is done as part of the EUS, there is an additional risk of pancreatitis at the rate of about 1-2%.  It was explained that procedure related pancreatitis is typically mild, although it can be severe and even life threatening, which is why we do not perform random pancreatic biopsies and only  biopsy a lesion/area we feel is concerning enough to warrant the risk.'  The risks of an ERCP were discussed at length, including but not limited to the risk of perforation, bleeding, abdominal pain, post-ERCP pancreatitis (while usually mild can be severe and even life threatening).   The risks and benefits of endoscopic evaluation/treatment were discussed with the patient and/or family; these include but are not limited to the risk of perforation, infection, bleeding, missed lesions, lack of diagnosis, severe illness requiring hospitalization, as well as anesthesia and sedation related illnesses.  The patient's history has been reviewed, patient examined, no change in status, and deemed stable for procedure.  The patient and/or family is agreeable to proceed.    Corliss Parish, MD [facility] Office # [phone]

## 2023-10-27 NOTE — Op Note (Signed)
[facility] Patient Name: [name] Procedure Date: 10/27/2023 MRN: [id number] Attending MD: Corliss Parish , MD, [id number] Date of Birth: [birth year] CSN: [id number] Age: 72 Admit Type: Outpatient Procedure:                Upper EUS Indications:              Pancreatic cyst on MRCP, Dilated pancreatic duct on                            MRCP Providers:                Corliss Parish, MD, Norman Clay, RN, Priscella Mann, Technician Referring MD:              Medicines:                General Anesthesia, Cipro 400 mg IV Complications:            No immediate complications. Estimated Blood Loss:     Estimated blood loss: none. Procedure:                Pre-Anesthesia Assessment:                           - Prior to the procedure, a History and Physical                            was performed, and patient medications and                            allergies were reviewed. The patient's tolerance of                            previous anesthesia was also reviewed. The risks                            and benefits of the procedure and the sedation                            options and risks were discussed with the patient.                            All questions were answered, and informed consent                            was obtained. Prior Anticoagulants: The patient has                            taken no anticoagulant or antiplatelet agents                            except for aspirin. ASA Grade Assessment: III - A  patient with severe systemic disease. After                            reviewing the risks and benefits, the patient was                            deemed in satisfactory condition to undergo the                            procedure.                           After obtaining informed consent, the endoscope was                            passed under direct vision. Throughout the                             procedure, the patient's blood pressure, pulse, and                            oxygen saturations were monitored continuously. The                            [id number]-H190 ([id number]) Olympus endoscope was introduced                            through the mouth, and advanced to the second part                            of duodenum. The [id number]-[id number] ([id number]) Olympus                            duodenoscope was introduced through the mouth, and                            advanced to the area of papilla. The [id number]                            ([id number]) Olympus linear ultrasound scope was                            introduced through the mouth, and advanced to the                            duodenum for ultrasound examination from the                            stomach and duodenum. The upper EUS was                            accomplished without difficulty. The patient  tolerated the procedure. Scope In: Scope Out: Findings:      ENDOSCOPIC FINDING: :      No gross lesions were noted in the entire esophagus.      The Z-line was irregular and was found 40 cm from the incisors.      Patchy mildly erythematous mucosa without bleeding was found in the       entire examined stomach (previously biopsied and negative for HP).      No gross lesions were noted in the duodenal bulb, in the first portion       of the duodenum and in the second portion of the duodenum.      Congested mucosa without active bleeding and with no stigmata of       bleeding was found at the major papilla (large intraduodenal portion       noted).      ENDOSONOGRAPHIC FINDING: :      An anechoic lesion suggestive of a cyst was identified in the genu of       the pancreas and pancreatic body. It is not in obvious communication       with the pancreatic duct. The lesion measured 24 mm by 15 mm in maximal       cross-sectional diameter. There was a single compartment without septae.       The  outer wall of the lesion was thin. There was no associated mass.       There was no internal debris within the fluid-filled cavity. Diagnostic       needle aspiration for fluid was performed. Color Doppler imaging was       utilized prior to needle puncture to confirm a lack of significant       vascular structures within the needle path. One pass was made with the       22 gauge needle using a transgastric approach. A stylet was used. The       amount of fluid collected was 10 mL. The fluid was clear, white and       watery. Sample(s) were sent for histology.      Pancreatic parenchymal abnormalities were noted in the pancreatic head       and uncinate process of the pancreas. These consisted of hyperechoic       strands.      Pancreatic parenchymal abnormalities were noted in the pancreatic body       and pancreatic tail. These consisted of atrophy.      The pancreatic duct had a normal endosonographic appearance in the       pancreatic head (1.3 mm) and genu of the pancreas 2.7 mm). Directly       thereafter, I lose the pancreatic duct in the neck where the cystic       lesion is present.      The pancreatic duct had a dilated endosonographic appearance in the body       of the pancreas (4.0 mm) and tail of the pancreas (2.7 mm).      There was no sign of significant endosonographic abnormality in the       common bile duct (2.4 mm).      Moderate hyperechoic material consistent with sludge was visualized       endosonographically in the gallbladder.      Endosonographic imaging of the ampulla showed no intramural       (subepithelial) lesion.      Endosonographic imaging in  the visualized portion of the liver showed no       mass.      No malignant-appearing lymph nodes were visualized in the celiac region       (level 20), peripancreatic region and porta hepatis region.      The celiac region was visualized. Impression:               EGD Impression:                           - No  gross lesions in the entire esophagus. Z-line                            irregular, 40 cm from the incisors.                           - Erythematous mucosa in the stomach.                           - No gross lesions in the duodenal bulb, in the                            first portion of the duodenum and in the second                            portion of the duodenum.                           - Congested major papilla with large intraduodenal                            portion.                           EUS Impression:                           - A cystic lesion was seen in the genu/body of the                            pancreas. Cytology results are pending. However,                            the endosonographic appearance is consistent with a                            pancreatic pseudocyst (recurrent nature). Fine                            needle aspiration for fluid performed.                           - Pancreatic parenchymal abnormalities consisting                            of hyperechoic strands were noted in the pancreatic  head and uncinate process of the pancreas.                            Pancreatic parenchymal abnormalities consisting of                            atrophy were noted in the pancreatic body and                            pancreatic tail.                           - The pancreatic duct had a normal endosonographic                            appearance in the pancreatic head and genu of the                            pancreas. It is at this region that I lose the                            pancreatic duct within the neck and the cystic                            lesion is noted from above. The pancreatic duct had                            a dilated endosonographic appearance in the body of                            the pancreas and tail of the pancreas thereafter.                           - There was no sign of significant  pathology in the                            common bile duct.                           - Hyperechoic material consistent with sludge was                            visualized endosonographically in the gallbladder.                           - No malignant-appearing lymph nodes were                            visualized in the celiac region (level 20),                            peripancreatic region and porta hepatis region. Moderate Sedation:      Not Applicable - Patient had care per Anesthesia. Recommendation:           -  Proceed to scheduled ERCP.                           - Observe patient's clinical course.                           - Ciprofloxacin 500 mg twice daily for 3-days to                            decrease risk of post-interventional infectious                            risk.                           - Observe patient's clinical course.                           - The findings and recommendations were discussed                            with the patient.                           - The findings and recommendations were discussed                            with the patient's family. Procedure Code(s):        --- Professional ---                           [phone], Esophagogastroduodenoscopy, flexible,                            transoral; with transendoscopic ultrasound-guided                            intramural or transmural fine needle                            aspiration/biopsy(s), (includes endoscopic                            ultrasound examination limited to the esophagus,                            stomach or duodenum, and adjacent structures) Diagnosis Code(s):        --- Professional ---                           K22.89, Other specified disease of esophagus                           K31.89, Other diseases of stomach and duodenum                           K86.2, Cyst of pancreas  K86.9, Disease of pancreas, unspecified                            I89.9, Noninfective disorder of lymphatic vessels                            and lymph nodes, unspecified                           K86.89, Other specified diseases of pancreas                           R93.3, Abnormal findings on diagnostic imaging of                            other parts of digestive tract                           K83.8, Other specified diseases of biliary tract CPT copyright 2022 [employer]. All rights reserved. The codes documented in this report are preliminary and upon coder review may  be revised to meet current compliance requirements. Corliss Parish, MD 10/27/2023 11:13:25 AM Number of Addenda: 0

## 2023-10-28 ENCOUNTER — Telehealth: Payer: Self-pay

## 2023-10-28 ENCOUNTER — Other Ambulatory Visit: Payer: Self-pay

## 2023-10-28 DIAGNOSIS — R7309 Other abnormal glucose: Secondary | ICD-10-CM

## 2023-10-28 LAB — CYTOLOGY - NON PAP

## 2023-10-28 NOTE — Telephone Encounter (Signed)
-----   Message from Katrinka Blazing Mayorga sent at 10/28/2023  3:27 PM EST ----- Regarding: RE: Followup Hi Vicente Serene, Thanks for your help with this patient.  He has been a tough case for sure! I will be more than happy to remove the pancreatic stent if still present on repeat x-ray.  Please keep me posted. Thanks ----- Message ----- From: Lemar Lofty., MD Sent: 10/27/2023  12:03 PM EST To: Loretha Stapler, RN; # Subject: Followup                                       Burlie Cajamarca, Please place order for CT abdomen IV and oral contrast for 3 to 4 months for follow-up of pancreatic pseudocyst. Thanks. GM  FYI [name], please see EUS/ERCP note for my full recommendations.

## 2023-10-29 ENCOUNTER — Encounter: Payer: Self-pay | Admitting: Gastroenterology

## 2023-10-29 ENCOUNTER — Encounter (HOSPITAL_COMMUNITY): Payer: Self-pay | Admitting: Gastroenterology

## 2023-11-05 DIAGNOSIS — R42 Dizziness and giddiness: Secondary | ICD-10-CM

## 2023-11-05 DIAGNOSIS — R002 Palpitations: Secondary | ICD-10-CM | POA: Diagnosis not present

## 2023-11-10 ENCOUNTER — Telehealth: Payer: Self-pay

## 2023-11-10 DIAGNOSIS — R42 Dizziness and giddiness: Secondary | ICD-10-CM | POA: Diagnosis not present

## 2023-11-10 DIAGNOSIS — R739 Hyperglycemia, unspecified: Secondary | ICD-10-CM | POA: Diagnosis not present

## 2023-11-10 NOTE — Telephone Encounter (Signed)
-----   Message from Nurse Jumanah Hynson P sent at 10/27/2023 11:59 AM EST ----- Regarding: FW: Followup Make sure pt has xray ----- Message ----- From: Lemar Lofty., MD Sent: 10/27/2023  11:45 AM EST To: Loretha Stapler, RN Subject: Followup                                       [name], This patient needs a 2-week KUB to follow-up pancreatic stent make sure that is fallen out.  Put that as an urgent read.  Thanks. GM

## 2023-11-10 NOTE — Telephone Encounter (Signed)
The pt has been advised and will come in tomorrow for xray

## 2023-11-11 NOTE — Telephone Encounter (Signed)
Patient states that he is actually set to remove his ZIO patch tomorrow anyways so would just like to wait until tomorrow for xray. Advised patient that this plan will be fine.

## 2023-11-11 NOTE — Telephone Encounter (Signed)
Patient called stated he is suppose to have an xray and realized he has a heart monitor. Doesn't know if that will effect him or not. Please advise.

## 2023-11-12 ENCOUNTER — Ambulatory Visit
Admission: RE | Admit: 2023-11-12 | Discharge: 2023-11-12 | Disposition: A | Discharge status: Home / Self Care | Payer: PPO | Source: Ambulatory Visit | Attending: Gastroenterology | Admitting: Gastroenterology

## 2023-11-12 DIAGNOSIS — K5641 Fecal impaction: Secondary | ICD-10-CM | POA: Diagnosis not present

## 2023-11-12 DIAGNOSIS — T85528A Displacement of other gastrointestinal prosthetic devices, implants and grafts, initial encounter: Secondary | ICD-10-CM

## 2023-11-23 DIAGNOSIS — R002 Palpitations: Secondary | ICD-10-CM | POA: Diagnosis not present

## 2023-11-23 DIAGNOSIS — R42 Dizziness and giddiness: Secondary | ICD-10-CM | POA: Diagnosis not present

## 2023-11-27 ENCOUNTER — Telehealth: Payer: Self-pay

## 2023-11-27 MED ORDER — BISOPROLOL FUMARATE 5 MG PO TABS: 2.5000 mg | ORAL_TABLET | Freq: Every day | ORAL | 3 refills | Status: DC

## 2023-11-27 NOTE — Telephone Encounter (Signed)
Monitor results discussed with patient.He agrees to start bisoprolol 2.5 mg daily.

## 2023-11-27 NOTE — Telephone Encounter (Signed)
-----   Message from Nona Dell sent at 11/27/2023  8:08 AM EST ----- Results reviewed.  Cardiac monitor did not show any atrial fibrillation.  He does have fairly frequent premature atrial complexes as well as bursts of PSVT.  None are prolonged but I expect he is sensing this in terms of palpitations as he described in the office.  We could try to get him back on a very low-dose beta-blocker to see if this helps, consider starting bisoprolol 2.5 mg daily.

## 2023-11-27 NOTE — Telephone Encounter (Signed)
The pt has been advised that there is up to a 3 week turn around time for results.  The pt has been advised of the information and verbalized understanding.

## 2023-11-27 NOTE — Telephone Encounter (Signed)
Patient calling to get x-ray results

## 2024-01-28 ENCOUNTER — Telehealth: Payer: Self-pay

## 2024-01-28 NOTE — Telephone Encounter (Signed)
-----   Message from Nurse Jkwon Treptow P sent at 10/28/2023  3:34 PM EST ----- Regarding: FW: Followup  ----- Message ----- From: [name], MD Sent: 10/28/2023   3:29 PM EST To: [name], RN; [name]., MD Subject: RE: Followup                                   Hi Aloha, Thanks for your help with this patient.  He has been a tough case for sure! I will be more than happy to remove the pancreatic stent if still present on repeat x-ray.  Please keep me posted. Thanks ----- Message ----- From: Wilhelmenia Aloha Raddle., MD Sent: 10/27/2023  12:03 PM EST To: [name], RN; # Subject: Followup                                       [name], Please place order for CT abdomen IV and oral contrast for 3 to 4 months for follow-up of pancreatic pseudocyst. Thanks. GM  FYI [name], please see EUS/ERCP note for my full recommendations.

## 2024-02-24 ENCOUNTER — Telehealth: Payer: Self-pay

## 2024-02-24 DIAGNOSIS — K863 Pseudocyst of pancreas: Secondary | ICD-10-CM

## 2024-02-24 NOTE — Telephone Encounter (Signed)
-----   Message from Nurse Melita Villalona P sent at 10/27/2023 12:04 PM EST ----- Regarding: FW: Followup  ----- Message ----- From: Lemar Lofty., MD Sent: 10/27/2023  12:03 PM EST To: Loretha Stapler, RN; # Subject: Followup                                       Ellise Kovack, Please place order for CT abdomen IV and oral contrast for 3 to 4 months for follow-up of pancreatic pseudocyst. Thanks. GM  FYI [name], please see EUS/ERCP note for my full recommendations.

## 2024-02-24 NOTE — Telephone Encounter (Signed)
 Spoke with the pt and discussed the need for f/u CT scan.  The pt has agreed to the CT. Order has been entered and sent to the schedulers.

## 2024-02-29 DIAGNOSIS — R42 Dizziness and giddiness: Secondary | ICD-10-CM | POA: Diagnosis not present

## 2024-02-29 DIAGNOSIS — G629 Polyneuropathy, unspecified: Secondary | ICD-10-CM | POA: Diagnosis not present

## 2024-03-07 ENCOUNTER — Ambulatory Visit (HOSPITAL_COMMUNITY)
Admission: RE | Admit: 2024-03-07 | Discharge: 2024-03-07 | Disposition: A | Discharge status: Home / Self Care | Source: Ambulatory Visit | Attending: Gastroenterology | Admitting: Gastroenterology

## 2024-03-07 ENCOUNTER — Encounter (HOSPITAL_COMMUNITY): Payer: Self-pay

## 2024-03-07 DIAGNOSIS — K8689 Other specified diseases of pancreas: Secondary | ICD-10-CM | POA: Diagnosis not present

## 2024-03-07 DIAGNOSIS — K863 Pseudocyst of pancreas: Secondary | ICD-10-CM | POA: Insufficient documentation

## 2024-03-07 DIAGNOSIS — K8020 Calculus of gallbladder without cholecystitis without obstruction: Secondary | ICD-10-CM | POA: Diagnosis not present

## 2024-03-07 MED ORDER — SODIUM CHLORIDE (PF) 0.9 % IJ SOLN
INTRAMUSCULAR | Status: AC
  Filled 2024-03-07: qty 50

## 2024-03-07 MED ORDER — IOHEXOL 300 MG/ML  SOLN
100.0000 mL | Freq: Once | INTRAMUSCULAR | Status: AC | PRN
  Administered 2024-03-07: 100 mL via INTRAVENOUS

## 2024-03-14 ENCOUNTER — Encounter: Payer: Self-pay | Admitting: Cardiology

## 2024-03-14 ENCOUNTER — Ambulatory Visit: Attending: Cardiology | Admitting: Cardiology

## 2024-03-14 VITALS — BP 138/70 | HR 64 | Ht 68.0 in | Wt 185.6 lb

## 2024-03-14 DIAGNOSIS — E782 Mixed hyperlipidemia: Secondary | ICD-10-CM | POA: Diagnosis not present

## 2024-03-14 DIAGNOSIS — R0602 Shortness of breath: Secondary | ICD-10-CM | POA: Diagnosis not present

## 2024-03-14 DIAGNOSIS — R42 Dizziness and giddiness: Secondary | ICD-10-CM | POA: Diagnosis not present

## 2024-03-14 DIAGNOSIS — I10 Essential (primary) hypertension: Secondary | ICD-10-CM | POA: Diagnosis not present

## 2024-03-14 DIAGNOSIS — I25119 Atherosclerotic heart disease of native coronary artery with unspecified angina pectoris: Secondary | ICD-10-CM

## 2024-03-14 NOTE — Progress Notes (Signed)
 Cardiology Office Note  Date: 03/14/2024   ID: Emmitt Matthews, DOB 03-04-1951, MRN [id number]  History of Present Illness: [name] is a 73 y.o. male last seen in October 2024.  He is here for a follow-up visit.  Reports a few concerns to discuss.  He states that he had a sinus infection about 6 weeks ago, in the last few weeks he has had intermittent episodes of dizziness.  Describes a feeling of unsteadiness and vertigo when he stands up and turns his head a certain way or stands up from leaning over.  No associated palpitations or syncope.  Unrelated to the symptoms, he also mentions a lack of stamina and exertional fatigue over period of months, somewhat atypical thoracic discomfort.  He did wear a cardiac monitor in October of last year showing frequent atrial ectopy and bursts of SVT.  Not entirely clear that this has been related to his recent symptoms.  We went over his medications, he reports compliance with treatment.  He continues to follow with Dr. Ouida Sills.  I went over his lab work which is noted below.  I reviewed his ECG today which shows sinus bradycardia with borderline prolonged PR interval.  Orthostatic vital signs were negative today, no significant change in heart rate or blood pressure.  Physical Exam: VS:  BP 138/70   Pulse 64   Ht 5\' 8"  (1.727 m)   Wt 185 lb 9.6 oz (84.2 kg)   SpO2 95%   BMI 28.22 kg/m , BMI Body mass index is 28.22 kg/m.  Wt Readings from Last 3 Encounters:  03/14/24 185 lb 9.6 oz (84.2 kg)  10/27/23 173 lb (78.5 kg)  10/20/23 179 lb 12.8 oz (81.6 kg)    General: Patient appears comfortable at rest. HEENT: Conjunctiva and lids normal. Neck: Supple, no elevated JVP or carotid bruits. Lungs: Clear to auscultation, nonlabored breathing at rest. Cardiac: Regular rate and rhythm, no S3 or significant systolic murmur. Extremities: No pitting edema.  ECG:  An ECG dated 10/20/2023 was personally reviewed today and demonstrated:  Sinus  rhythm with leftward axis, probable old anterior infarct pattern, PVCs.  Labwork: 05/12/2023: Hemoglobin 15.5; Platelets 193 10/27/2023: ALT 23; AST 19; BUN 22; Creatinine, Ser 1.21; Potassium 4.3; Sodium 138  August 2024: LP(a) less than 8.4, LDL 47 March 2025: Hemoglobin 14.6, platelets 217, BUN 18, creatinine 1.05, potassium 4.7, AST 18, ALT 19, TSH 3.51  Other Studies Reviewed Today:  Cardiac monitor December 2024: ZIO XT reviewed.  6 days, 21 hours analyzed.   Predominant rhythm is sinus with heart rate ranging from 50 bpm up to 138 bpm and average heart rate 78 bpm. There were frequent PACs representing 10.6% total beats with otherwise occasional atrial couplets and triplets. There were rare PVCs representing less than 1% total beats. Multiple (2749) episodes of PSVT were noted, the longest of which lasted 13.2 seconds with average heart rate in the 130s.  Brief bursts of NSVT versus aberrantly conducted SVT also noted. No pauses or high degree heart block.  Assessment and Plan:  1.  Intermittent lightheadedness/vertigo, potentially inner ear etiology based on description.  He was not orthostatic today and ECG is stable.  Does not clearly correlate sense of palpitations with these symptoms.  2.  Multivessel CAD status post CABG in 2014 [facility]) with LIMA to LAD and SVG to first diagonal.  LVEF 60 to 65% by echocardiogram in 2020.  He does report lack of stamina and exertional fatigue with atypical thoracic discomfort  in the interim.  ECG reviewed and stable.  Plan to proceed with follow-up ischemic and structural cardiac testing via Lexiscan Myoview and echocardiogram.  Continue aspirin 81 mg daily and Repatha.   3.  Primary hypertension.  Continue irbesartan 75 mg daily.   4.  Mixed hyperlipidemia with intolerance to statins and Zetia.  He follows in the lipid clinic, saw Dr. Rennis Golden in August and continues to do well on Repatha.  Last LDL 47 in August 2024.  Disposition:  Follow  up  test results.  Signed, Jonelle Sidle, M.D., F.A.C.C. [facility]

## 2024-03-14 NOTE — Patient Instructions (Signed)
 Medication Instructions:   Your physician recommends that you continue on your current medications as directed. Please refer to the Current Medication list given to you today.  Labwork: None today  Testing/Procedures: Your physician has requested that you have an echocardiogram. Echocardiography is a painless test that uses sound waves to create images of your heart. It provides your doctor with information about the size and shape of your heart and how well your heart's chambers and valves are working. This procedure takes approximately one hour. There are no restrictions for this procedure. Please do NOT wear cologne, perfume, aftershave, or lotions (deodorant is allowed). Please arrive 15 minutes prior to your appointment time.  Please note: We ask at that you not bring children with you during ultrasound (echo/ vascular) testing. Due to room size and safety concerns, children are not allowed in the ultrasound rooms during exams. Our front office staff cannot provide observation of children in our lobby area while testing is being conducted. An adult accompanying a patient to their appointment will only be allowed in the ultrasound room at the discretion of the ultrasound technician under special circumstances. We apologize for any inconvenience.   Your physician has requested that you have a lexiscan myoview. For further information please visit [email or web address] Please follow instruction sheet, as given.   Follow-Up: To be determined  Any Other Special Instructions Will Be Listed Below (If Applicable).  If you need a refill on your cardiac medications before your next appointment, please call your pharmacy.

## 2024-03-22 NOTE — Telephone Encounter (Signed)
 The pt has been advised that we have not received the results as of today. We will call him as soon as able.

## 2024-03-22 NOTE — Telephone Encounter (Signed)
 Inbound call from patient requesting to speak with a nurse in regards to results.   Please advise.

## 2024-04-05 ENCOUNTER — Ambulatory Visit (HOSPITAL_COMMUNITY)
Admission: RE | Admit: 2024-04-05 | Discharge: 2024-04-05 | Disposition: A | Discharge status: Home / Self Care | Source: Ambulatory Visit | Attending: Cardiology | Admitting: Cardiology

## 2024-04-05 ENCOUNTER — Ambulatory Visit (HOSPITAL_BASED_OUTPATIENT_CLINIC_OR_DEPARTMENT_OTHER)
Admission: RE | Admit: 2024-04-05 | Discharge: 2024-04-05 | Disposition: A | Discharge status: Home / Self Care | Source: Ambulatory Visit | Attending: Cardiology | Admitting: Cardiology

## 2024-04-05 DIAGNOSIS — R0602 Shortness of breath: Secondary | ICD-10-CM | POA: Diagnosis not present

## 2024-04-05 DIAGNOSIS — I25119 Atherosclerotic heart disease of native coronary artery with unspecified angina pectoris: Secondary | ICD-10-CM

## 2024-04-05 LAB — ECHOCARDIOGRAM COMPLETE
AR max vel: 3.11 cm2
AV Area VTI: 3.46 cm2
AV Area mean vel: 2.98 cm2
AV Mean grad: 3 mmHg
AV Peak grad: 5.6 mmHg
Ao pk vel: 1.18 m/s
Area-P 1/2: 2.42 cm2
Calc EF: 58.8 %
MV VTI: 3.04 cm2
S' Lateral: 2.95 cm
Single Plane A2C EF: 61.8 %
Single Plane A4C EF: 52.8 %

## 2024-04-05 LAB — NM MYOCAR MULTI W/SPECT W/WALL MOTION / EF
Base ST Depression (mm): 0 mm
Estimated workload: 1
Exercise duration (min): 0 min
Exercise duration (sec): 0 s
LV dias vol: 85 mL (ref 62–150)
LV sys vol: 36 mL
MPHR: 145 {beats}/min
Nuc Stress EF: 58 %
Peak HR: 73 {beats}/min
Percent HR: 50 %
RATE: 0.3
Rest HR: 49 {beats}/min
Rest Nuclear Isotope Dose: 11 mCi
SDS: 2
SRS: 8
SSS: 10
ST Depression (mm): 0 mm
Stress Nuclear Isotope Dose: 33 mCi
TID: 1.15

## 2024-04-05 MED ORDER — TECHNETIUM TC 99M TETROFOSMIN IV KIT
30.0000 | PACK | Freq: Once | INTRAVENOUS | Status: AC | PRN
  Administered 2024-04-05: 33 via INTRAVENOUS

## 2024-04-05 MED ORDER — REGADENOSON 0.4 MG/5ML IV SOLN
INTRAVENOUS | Status: AC
  Administered 2024-04-05: 0.4 mg via INTRAVENOUS
  Filled 2024-04-05: qty 5

## 2024-04-05 MED ORDER — TECHNETIUM TC 99M TETROFOSMIN IV KIT
10.0000 | PACK | Freq: Once | INTRAVENOUS | Status: AC | PRN
  Administered 2024-04-05: 11 via INTRAVENOUS

## 2024-04-05 MED ORDER — SODIUM CHLORIDE FLUSH 0.9 % IV SOLN
INTRAVENOUS | Status: AC
  Administered 2024-04-05: 10 mL via INTRAVENOUS
  Filled 2024-04-05: qty 10

## 2024-04-05 NOTE — Progress Notes (Signed)
  Echocardiogram 2D Echocardiogram has been performed.  Alezander Dimaano L Khary Schaben RDCS 04/05/2024, 8:59 AM

## 2024-04-11 DIAGNOSIS — K863 Pseudocyst of pancreas: Secondary | ICD-10-CM | POA: Diagnosis not present

## 2024-04-11 DIAGNOSIS — I10 Essential (primary) hypertension: Secondary | ICD-10-CM | POA: Diagnosis not present

## 2024-04-19 ENCOUNTER — Encounter: Payer: Self-pay | Admitting: Gastroenterology

## 2024-04-19 ENCOUNTER — Ambulatory Visit: Admitting: Gastroenterology

## 2024-04-19 VITALS — BP 110/66 | HR 60 | Ht 68.0 in | Wt 179.4 lb

## 2024-04-19 DIAGNOSIS — K8689 Other specified diseases of pancreas: Secondary | ICD-10-CM | POA: Diagnosis not present

## 2024-04-19 DIAGNOSIS — K8021 Calculus of gallbladder without cholecystitis with obstruction: Secondary | ICD-10-CM | POA: Diagnosis not present

## 2024-04-19 DIAGNOSIS — K863 Pseudocyst of pancreas: Secondary | ICD-10-CM | POA: Diagnosis not present

## 2024-04-19 DIAGNOSIS — K8081 Other cholelithiasis with obstruction: Secondary | ICD-10-CM

## 2024-04-19 DIAGNOSIS — K862 Cyst of pancreas: Secondary | ICD-10-CM

## 2024-04-19 DIAGNOSIS — Z8719 Personal history of other diseases of the digestive system: Secondary | ICD-10-CM | POA: Diagnosis not present

## 2024-04-19 NOTE — Patient Instructions (Addendum)
 You have been scheduled for a CT scan of the abdomen and pelvis at [facility], 1st floor Radiology. You are scheduled on 07/11/24 at 8:30 am . You should arrive minutes 30 prior to your appointment time for registration.   Please follow the written instructions below on the day of your exam:   1) Do not eat anything after 4:30 am  (4 hours prior to your test)    You may take any medications as prescribed with a small amount of water , if necessary. If you take any of the following medications: METFORMIN, GLUCOPHAGE, GLUCOVANCE, AVANDAMET, RIOMET, FORTAMET, ACTOPLUS MET, JANUMET, GLUMETZA or METAGLIP, you MAY be asked to HOLD this medication 48 hours AFTER the exam.   The purpose of you drinking the oral contrast is to aid in the visualization of your intestinal tract. The contrast solution may cause some diarrhea. Depending on your individual set of symptoms, you may also receive an intravenous injection of x-ray contrast/dye. Plan on being at [facility] for 45 minutes or longer, depending on the type of exam you are having performed.   If you have any questions regarding your exam or if you need to reschedule, you may call [facility] at [phone] between the hours of 8:00 am and 5:00 pm, Monday-Friday.     Due to recent changes in healthcare laws, you may see the results of your imaging and laboratory studies on MyChart before your provider has had a chance to review them.  We understand that in some cases there may be results that are confusing or concerning to you. Not all laboratory results come back in the same time frame and the provider may be waiting for multiple results in order to interpret others.  Please give us  48 hours in order for your provider to thoroughly review all the results before contacting the office for clarification of your results.   _______________________________________________________  If your blood pressure at your visit was 140/90 or greater,  please contact your primary care physician to follow up on this.  _______________________________________________________  If you are age 75 or older, your body mass index should be between 23-30. Your Body mass index is 27.27 kg/m. If this is out of the aforementioned range listed, please consider follow up with your Primary Care Provider.  If you are age 20 or younger, your body mass index should be between 19-25. Your Body mass index is 27.27 kg/m. If this is out of the aformentioned range listed, please consider follow up with your Primary Care Provider.   ________________________________________________________  The [facility] providers would like to encourage you to use MYCHART to communicate with providers for non-urgent requests or questions.  Due to long hold times on the telephone, sending your provider a message by [facility] may be a faster and more efficient way to get a response.  Please allow 48 business hours for a response.  Please remember that this is for non-urgent requests.  _______________________________________________________  Thank you for choosing me and [facility].  Dr. Brice Campi

## 2024-04-20 ENCOUNTER — Encounter: Payer: Self-pay | Admitting: Gastroenterology

## 2024-04-20 DIAGNOSIS — Z8719 Personal history of other diseases of the digestive system: Secondary | ICD-10-CM | POA: Insufficient documentation

## 2024-04-20 DIAGNOSIS — K8021 Calculus of gallbladder without cholecystitis with obstruction: Secondary | ICD-10-CM | POA: Insufficient documentation

## 2024-04-20 NOTE — Progress Notes (Signed)
 GASTROENTEROLOGY OUTPATIENT CLINIC VISIT   Primary Care Provider Artemisa Bile, MD [address] [place] [address] [phone]  Referring Provider Dr. Sammi Crick  Patient Profile: [name] is a 73 y.o. male with a pmh significant for CAD (status post CABG), hypertension, hyperlipidemia, colon polyps, GERD, prior complicated necrotizing pancreatitis (etiology unclear drug-induced from Lyrica  versus microlithiasis (no cholelithiasis on imaging initially but in 2025 has cholelithiasis)) with recurring/persisting pseudocyst suggestive of duct disruption.  The patient presents to the [facility] for an evaluation and management of problem(s) noted below:  Problem List 1. Pancreatic pseudocyst   2. Pancreatic duct disruption   3. History of pancreatitis   4. Calculus of gallbladder with biliary obstruction but without cholecystitis    Discussed the use of AI scribe software for clinical note transcription with the patient, who gave verbal consent to proceed.  History of Present Illness Please see prior GI notes for full details of HPI.  Interval History The patient returns for follow-up.  He has a history of prior idiopathic necrotizing pancreatitis with recurrent pseudocyst development concerning for pancreatic duct disruption and recent diagnosis of cholelithiasis.  Since I last saw him for EUS/ERCP (described in detail below) he has done quite well.  No evidence of post-procedural pancreatitis or infection occurred.  His most recent CT scan (approximately 86-month after his procedures) showed the cyst has remained relatively stable (though we did aspirate it at the time of the EUS); also, gallstones were identified.   He experiences no pain after eating rich or fatty foods and has not had any right upper quadrant pain or symptoms suggestive of gallbladder colic.  Overall, he feels well and continues to enjoy playing golf.  He tries to stay as healthy as he  can with his diet but recently did enjoy some fried foods and had no discomfort and is mindful of his diet, trying to minimize fatty foods to avoid putting his pancreas at risk.  No new symptoms such as nausea or vomiting are present, and his weight remains stable around 179 pounds.   GI Review of Systems Positive as above Negative for dysphagia, odynophagia, alteration of bowel habits, melena, hematochezia  Review of Systems General: Denies fevers/chills/unintentional weight loss Cardiovascular: Denies chest pain/palpitations Pulmonary: Denies shortness of breath Gastroenterological: See HPI Genitourinary: Denies darkened urine Hematological: Denies easy bruising/bleeding Dermatological: Denies jaundice Psychological: Mood is stable   Medications Current Outpatient Medications  Medication Sig Dispense Refill   aspirin  81 MG tablet Take 1 tablet (81 mg total) by mouth daily. 30 tablet    bisoprolol  (ZEBETA ) 5 MG tablet Take 2.5 mg by mouth daily.     Evolocumab  (REPATHA  SURECLICK) 140 MG/ML SOAJ INJECT 1 DOSE INTO THE SKIN EVERY 14 DAYS 2 mL 11   irbesartan  (AVAPRO ) 75 MG tablet Take 37.5 mg by mouth daily.     Multiple Vitamins-Minerals (MULTIVITAMIN WITH MINERALS) tablet Take 1 tablet by mouth daily.     pantoprazole  (PROTONIX ) 40 MG tablet Take one tablet daily 30 tablet 3   No current facility-administered medications for this visit.    Allergies Allergies  Allergen Reactions   Pregabalin  Other (See Comments)    aching     Histories Past Medical History:  Diagnosis Date   Colon polyps    benign per pt   Coronary artery disease    a. s/p CABG in 2014 with LIMA-LAD and SVG-D1 - performed in [place]   Fracture, ribs    Hyperlipidemia    Hypertension  Pancreatitis    Peripheral neuropathy    Past Surgical History:  Procedure Laterality Date   BALLOON DILATION N/A 12/09/2021   Procedure: BALLOON DILATION;  Surgeon: Mansouraty, Albino Alu., MD;  Location: [facility];  Service: Gastroenterology;  Laterality: N/A;   BALLOON DILATION N/A 11/27/2022   Procedure: BALLOON DILATION;  Surgeon: Brice Campi Albino Alu., MD;  Location: [facility];  Service: Gastroenterology;  Laterality: N/A;   BILIARY STENT PLACEMENT N/A 12/09/2021   Procedure: BILIARY STENT PLACEMENT;  Surgeon: Brice Campi Albino Alu., MD;  Location: [facility];  Service: Gastroenterology;  Laterality: N/A;  double pigtail stents x 2   BIOPSY  12/09/2021   Procedure: BIOPSY;  Surgeon: Brice Campi Albino Alu., MD;  Location: [facility];  Service: Gastroenterology;;   BIOPSY  01/20/2022   Procedure: BIOPSY;  Surgeon: Normie Becton., MD;  Location: [facility];  Service: Gastroenterology;;   BIOPSY  11/27/2022   Procedure: BIOPSY;  Surgeon: Normie Becton., MD;  Location: [facility];  Service: Gastroenterology;;   CATARACT EXTRACTION W/PHACO Right 02/15/2021   Procedure: CATARACT EXTRACTION PHACO AND INTRAOCULAR LENS PLACEMENT (IOC);  Surgeon: Tarri Farm, MD;  Location: [facility];  Service: Ophthalmology;  Laterality: Right;  CDE 8.65   CATARACT EXTRACTION W/PHACO Left 03/04/2021   Procedure: CATARACT EXTRACTION PHACO AND INTRAOCULAR LENS PLACEMENT (IOC);  Surgeon: Tarri Farm, MD;  Location: [facility];  Service: Ophthalmology;  Laterality: Left;  CDE: 8.15   COLONOSCOPY N/A 03/11/2017   [name]: 2 sessile serrated polyps removed, next colonoscopy in 5 years   COLONOSCOPY WITH PROPOFOL  N/A 04/24/2022   Procedure: COLONOSCOPY WITH PROPOFOL ;  Surgeon: Ruby Corporal, MD;  [facility];  Service: Endoscopy;  Laterality: N/A;  1250   CORONARY ARTERY BYPASS GRAFT  2014   CYST GASTROSTOMY  11/27/2022   Procedure: CYST GASTROSTOMY;  Surgeon: Brice Campi Albino Alu., MD;  Location: [facility];  Service: Gastroenterology;;   DUODENAL STENT PLACEMENT N/A 12/09/2021   Procedure: Sherlyn Ditto PLACEMENT;  Surgeon: Normie Becton., MD;  Location: [facility];   Service: Gastroenterology;  Laterality: N/A;   ERCP N/A 10/27/2023   Procedure: ENDOSCOPIC RETROGRADE CHOLANGIOPANCREATOGRAPHY (ERCP);  Surgeon: Normie Becton., MD;  Location: [facility];  Service: Gastroenterology;  Laterality: N/A;   ESOPHAGOGASTRODUODENOSCOPY N/A 12/09/2021   Procedure: ESOPHAGOGASTRODUODENOSCOPY (EGD);  Surgeon: Normie Becton., MD;  Location: [facility];  Service: Gastroenterology;  Laterality: N/A;   ESOPHAGOGASTRODUODENOSCOPY N/A 10/27/2023   Procedure: ESOPHAGOGASTRODUODENOSCOPY (EGD);  Surgeon: Normie Becton., MD;  Location: [facility];  Service: Gastroenterology;  Laterality: N/A;   ESOPHAGOGASTRODUODENOSCOPY (EGD) WITH PROPOFOL  N/A 01/20/2022   Procedure: ESOPHAGOGASTRODUODENOSCOPY (EGD) WITH PROPOFOL ;  Surgeon: Brice Campi Albino Alu., MD;  [facility];  Service: Gastroenterology;  Laterality: N/A;   ESOPHAGOGASTRODUODENOSCOPY (EGD) WITH PROPOFOL  N/A 11/27/2022   Procedure: ESOPHAGOGASTRODUODENOSCOPY (EGD) WITH PROPOFOL ;  Surgeon: Brice Campi Albino Alu., MD;  [facility];  Service: Gastroenterology;  Laterality: N/A;   ESOPHAGOGASTRODUODENOSCOPY (EGD) WITH PROPOFOL  N/A 01/06/2023   Procedure: ESOPHAGOGASTRODUODENOSCOPY (EGD) WITH PROPOFOL ;  Surgeon: Brice Campi Albino Alu., MD;  [facility]: [facility];  Service: Gastroenterology;  Laterality: N/A;   EUS N/A 11/27/2022   Procedure: UPPER ENDOSCOPIC ULTRASOUND (EUS) LINEAR;  Surgeon: Normie Becton., MD;  Location: [facility];  Service: Gastroenterology;  Laterality: N/A;   FINE NEEDLE ASPIRATION N/A 10/27/2023   Procedure: FINE NEEDLE ASPIRATION (FNA) LINEAR;  Surgeon: Normie Becton., MD;  Location: [facility];  Service: Gastroenterology;  Laterality: N/A;   HERNIA REPAIR Left 2010   inguinal  PANCREATIC STENT PLACEMENT  11/27/2022   Procedure: PANCREATIC STENT PLACEMENT;  Surgeon: Brice Campi Albino Alu., MD;  Location: [facility];  Service:  Gastroenterology;;   PANCREATIC STENT PLACEMENT  10/27/2023   Procedure: PANCREATIC STENT PLACEMENT;  Surgeon: Normie Becton., MD;  Location: [facility];  Service: Gastroenterology;;   POLYPECTOMY  03/11/2017   Procedure: POLYPECTOMY;  Surgeon: Ruby Corporal, MD;  [facility];  Service: Endoscopy;;  colon   POLYPECTOMY  04/24/2022   Procedure: POLYPECTOMY;  Surgeon: Ruby Corporal, MD;  [facility];  Service: Endoscopy;;   REMOVAL OF STONES  10/27/2023   Procedure: REMOVAL OF SLUDGE;  Surgeon: Normie Becton., MD;  Location: [facility];  Service: Gastroenterology;;   ROTATOR CUFF REPAIR Right 2014   SPHINCTEROTOMY  10/27/2023   Procedure: [address];  Surgeon: Mansouraty, Albino Alu., MD;  Location: [facility];  Service: Gastroenterology;;  CBD and pancreatic   STENT REMOVAL  01/20/2022   Procedure: STENT REMOVAL;  Surgeon: Normie Becton., MD;  Location: [facility];  Service: Gastroenterology;;   Y[place]onne Herald REMOVAL  01/06/2023   Procedure: STENT REMOVAL;  Surgeon: Normie Becton., MD;  Location: [facility];  Service: Gastroenterology;;   UPPER ESOPHAGEAL ENDOSCOPIC ULTRASOUND (EUS) N/A 12/09/2021   Procedure: UPPER ESOPHAGEAL ENDOSCOPIC ULTRASOUND (EUS);  Surgeon: Normie Becton., MD;  Location: [facility];  Service: Gastroenterology;  Laterality: N/A;   UPPER ESOPHAGEAL ENDOSCOPIC ULTRASOUND (EUS) N/A 10/27/2023   Procedure: UPPER ESOPHAGEAL ENDOSCOPIC ULTRASOUND (EUS);  Surgeon: Normie Becton., MD;  Location: [facility];  Service: Gastroenterology;  Laterality: N/A;   vericous veins  2001   Social History   Socioeconomic History   Marital status: Divorced    Spouse name: Not on file   Number of children: 2   Years of education: 16   Highest education level: Not on file  Occupational History    Comment: retired  Tobacco Use   Smoking status: Never   Smokeless tobacco: Never  Vaping Use   Vaping  status: Never Used  Substance and Sexual Activity   Alcohol use: Not Currently   Drug use: No   Sexual activity: Not on file  Other Topics Concern   Not on file  Social History Narrative   Lives with sig other   caffeine-  A little   Social Drivers of Corporate investment banker Strain: Not on file  Food Insecurity: Not on file  Transportation Needs: Not on file  Physical Activity: Not on file  Stress: Not on file  Social Connections: Not on file  Intimate Partner Violence: Not on file   Family History  Problem Relation Age of Onset   Stroke Mother    Heart disease Father    Clotting disorder Father    Dementia Sister    Thyroid  disease Sister    Hypertension Sister    Colon cancer Neg Hx    Esophageal cancer Neg Hx    Rectal cancer Neg Hx    Inflammatory bowel disease Neg Hx    Liver disease Neg Hx    Pancreatic cancer Neg Hx    Stomach cancer Neg Hx    I have reviewed his medical, social, and family history in detail and updated the electronic medical record as necessary.    PHYSICAL EXAMINATION  BP 110/66   Pulse 60   Ht 5\' 8"  (1.727 m)   Wt 179 lb 6 oz (81.4 kg)   SpO2 98%   BMI 27.27 kg/m  Wt Readings from Last 3 Encounters:  04/19/24 179 lb 6 oz (81.4 kg)  03/14/24 185 lb 9.6 oz (84.2 kg)  10/27/23 173 lb (78.5 kg)  Telemedicine visit   REVIEW OF DATA  I reviewed the following data at the time of this encounter:  GI Procedures and Studies  November 2024 ERCP - The major papilla appeared congested and had a large intraduodenal portion. -Initial attempts at pancreatic ductal cannulation were unsuccessful leading to wire placement within the biliary tree. - A single mild biliary narrowing was found in the lower third of the main bile duct. This is indeterminate, but the patient has no evidence of obstruction and I suspect this is more from the persisting amount of intraduodenal portion of the duct that is still present (I could have extended  sphincterotomy to at least 15 to 20 mm but did not feel that was necessary in this particular case as to what our indication was). Small amount of biliary sludge removed upon balloon sweep. -After difficulty in obtaining cannulation of the pancreatic duct as documented above, we noted what appeared to be a stricture within the neck and subsequently injected and saw what appeared to be some mild contrast extravasation within that area (concerning for pancreatic duct disruption into the previous cystic cavity). - A biliary sphincterotomy was performed. - A pancreatic sphincterotomy was performed. - Multiple attempts at trying to get deeper pancreatic duct cannulation were not successful as a result of the likely duct disruption. - One temporary plastic pancreatic stent was placed into the ventral pancreatic duct to decrease PEP.  November 2024 EUS EGD Impression: - No gross lesions in the entire esophagus. Z-line irregular, 40 cm from the incisors. - Erythematous mucosa in the stomach. - No gross lesions in the duodenal bulb, in the first portion of the duodenum and in the second portion of the duodenum. - Congested major papilla with large intraduodenal portion. EUS Impression: - A cystic lesion was seen in the genu/body of the pancreas. Cytology results are pending. However, the endosonographic appearance is consistent with a pancreatic pseudocyst (recurrent nature). Fine needle aspiration for fluid performed. - Pancreatic parenchymal abnormalities consisting of hyperechoic strands were noted in the pancreatic head and uncinate process of the pancreas. Pancreatic parenchymal abnormalities consisting of atrophy were noted in the pancreatic body and pancreatic tail. - The pancreatic duct had a normal endosonographic appearance in the pancreatic head and genu of the pancreas. It is at this region that I lose the pancreatic duct within the neck and the cystic lesion is noted from above. The pancreatic duct had a  dilated endosonographic appearance in the body of the pancreas and tail of the pancreas thereafter. - There was no sign of significant pathology in the common bile duct. - Hyperechoic material consistent with sludge was visualized endosonographically in the gallbladder. - No malignant-appearing lymph nodes were visualized in the celiac region (level 20), peripancreatic region and porta hepatis region.  Laboratory Studies  Reviewed those in epic and care everywhere  Imaging Studies  April 2025 pancreas protocol CT abdomen IMPRESSION: Improving pancreatic pseudocyst now measuring 2.7 cm. Possible mild acute pancreatitis. Correlate with laboratory evaluation. Cholelithiasis, without associated inflammatory changes   ASSESSMENT  Mr. Yoho is a 73 y.o. male with a pmh significant for CAD (status post CABG), hypertension, hyperlipidemia, colon polyps, GERD, prior complicated necrotizing pancreatitis (etiology unclear drug-induced from Lyrica  versus microlithiasis (no cholelithiasis on imaging initially but in 2025 has cholelithiasis)) with recurring/persisting pseudocyst suggestive of duct disruption.  The patient is seen today for evaluation and management of:  1. Pancreatic pseudocyst   2. Pancreatic duct disruption   3. History of pancreatitis   4. Calculus of gallbladder with biliary obstruction but without cholecystitis    The patient is clinically and hemodynamically stable at this time.  The initial etiology of his single episode of pancreatitis was felt to be idiopathic or drug-induced.  Interestingly now he has cholelithiasis.  This had not been seen previously.  It is reasonable to consider the role of cholecystectomy, though thankfully is not having any biliary colic.  He wants time to think about this and for now we will hold off on surgical evaluation.  In regards to his recurrent pancreatic cyst concerning for pancreatic duct disruption, it is only slowly increased to the size it was  during the aspiration in December.  We discussed potential repeat pancreatic ERCP attempt here locally with me versus at a quaternary center.  He wants to hold on this for now and see how he does in follow-up.  Will plan a repeat imaging study in 4 to 6 months from the last CT pancreas protocol study to see if things are progressing.  If cyst is enlarging, then repeat cyst aspiration versus gastrostomy will need to be considered and pancreatic ERCP should be reconsidered.  All patient questions were answered to the best of my ability, and the patient agrees to the aforementioned plan of action with follow-up as indicated.   PLAN  For now patient would like to hold on any other invasive testing or repeat procedures CT abdomen pancreas protocol to be done August or September or October of this year If pancreatic cyst has progressed or enlarge: - Pancreatic ERCP is recommended - EUS cystgastrostomy versus aspiration is recommended --Can be pursued here locally with me or at quaternary center Patient holding on cholecystectomy evaluation   Orders Placed This Encounter  Procedures   CT ABDOMEN W WO CONTRAST    New Prescriptions   No medications on file   Modified Medications   No medications on file    Planned Follow Up No follow-ups on file.   Total Time in Face-to-Face and in Coordination of Care for patient including independent/personal interpretation/review of prior testing, medical history, examination, medication adjustment, imaging evaluation with patient and family, communicating results with the patient directly, and documentation within the EHR is 25 minutes.   Yong Henle, MD [facility] Office # [phone]

## 2024-04-21 DIAGNOSIS — D225 Melanocytic nevi of trunk: Secondary | ICD-10-CM | POA: Diagnosis not present

## 2024-04-21 DIAGNOSIS — Z1283 Encounter for screening for malignant neoplasm of skin: Secondary | ICD-10-CM | POA: Diagnosis not present

## 2024-04-25 ENCOUNTER — Ambulatory Visit (INDEPENDENT_AMBULATORY_CARE_PROVIDER_SITE_OTHER)

## 2024-04-25 ENCOUNTER — Encounter (INDEPENDENT_AMBULATORY_CARE_PROVIDER_SITE_OTHER): Payer: Self-pay

## 2024-04-25 VITALS — BP 116/70 | HR 57

## 2024-04-25 DIAGNOSIS — H6123 Impacted cerumen, bilateral: Secondary | ICD-10-CM | POA: Diagnosis not present

## 2024-04-25 DIAGNOSIS — R42 Dizziness and giddiness: Secondary | ICD-10-CM

## 2024-04-26 DIAGNOSIS — H6123 Impacted cerumen, bilateral: Secondary | ICD-10-CM | POA: Insufficient documentation

## 2024-04-26 DIAGNOSIS — R42 Dizziness and giddiness: Secondary | ICD-10-CM | POA: Insufficient documentation

## 2024-04-26 NOTE — Progress Notes (Signed)
 Patient ID: [name], male   DOB: [birth year], 73 y.o.   MRN: [id number]  Follow-up: Recurrent cerumen impaction New complaint: Recurrent dizziness  HPI: The patient is a 73 year old male who returns today for his follow-up evaluation.  The patient was previously seen for his recurrent cerumen impaction.  The patient presents today with a new complaint of dizziness.  His dizziness started 6 weeks ago.  He describes the dizziness as a spinning vertigo the last for several seconds.  The dizziness is often triggered with sudden movement.  The dizziness has mostly resolved.  Currently he denies any otalgia, otorrhea, or vertigo.  Exam: General: Communicates without difficulty, well nourished, no acute distress. Head: Normocephalic, no evidence injury, no tenderness, facial buttresses intact without stepoff. Face/sinus: No tenderness to palpation and percussion. Facial movement is normal and symmetric. Eyes: PERRL, EOMI. No scleral icterus, conjunctivae clear. Neuro: CN II exam reveals vision grossly intact.  No nystagmus at any point of gaze. Ears: Auricles well formed without lesions.  Bilateral cerumen impaction.  Nose: External evaluation reveals normal support and skin without lesions.  Dorsum is intact.  Anterior rhinoscopy reveals congested mucosa over anterior aspect of inferior turbinates and intact septum.  No purulence noted. Oral:  Oral cavity and oropharynx are intact, symmetric, without erythema or edema.  Mucosa is moist without lesions. Neck: Full range of motion without pain.  There is no significant lymphadenopathy.  No masses palpable.  Thyroid  bed within normal limits to palpation.  Parotid glands and submandibular glands equal bilaterally without mass.  Trachea is midline. Neuro:  CN 2-12 grossly intact. Vestibular: No nystagmus at any point of gaze. Dix Hallpike negative. Vestibular: There is no nystagmus with pneumatic pressure on either tympanic membrane or Valsalva. The cerebellar  examination is unremarkable.    Procedure: Bilateral cerumen disimpaction Anesthesia: None Description: Under the operating microscope, the cerumen is carefully removed with a combination of cerumen currette, alligator forceps, and suction catheters.  After the cerumen is removed, the TMs are noted to be normal.  No mass, erythema, or lesions. The patient tolerated the procedure well.   Assessment: 1.  Bilateral recurrent cerumen impaction.  After the disimpaction procedure, both tympanic membranes and middle ear spaces are noted to be normal. 2.  His recurrent dizziness is likely secondary to transient BPPV.  His Dix-Hallpike maneuver is negative.  He is currently asymptomatic.  Plan: 1.  Otomicroscopy with bilateral cerumen disimpaction. 2.  The physical exam findings are reviewed with the patient. 3.  The patient will return for reevaluation if his dizziness recurs.  He may benefit from the Epley maneuver when he is symptomatic.

## 2024-04-27 ENCOUNTER — Other Ambulatory Visit: Payer: Self-pay | Admitting: Internal Medicine

## 2024-04-27 DIAGNOSIS — E785 Hyperlipidemia, unspecified: Secondary | ICD-10-CM

## 2024-04-27 DIAGNOSIS — I25810 Atherosclerosis of coronary artery bypass graft(s) without angina pectoris: Secondary | ICD-10-CM

## 2024-07-11 ENCOUNTER — Ambulatory Visit (HOSPITAL_COMMUNITY)
Admission: RE | Admit: 2024-07-11 | Discharge: 2024-07-11 | Disposition: A | Discharge status: Home / Self Care | Source: Ambulatory Visit | Attending: Gastroenterology | Admitting: Gastroenterology

## 2024-07-11 DIAGNOSIS — K8689 Other specified diseases of pancreas: Secondary | ICD-10-CM | POA: Diagnosis not present

## 2024-07-11 DIAGNOSIS — Z8719 Personal history of other diseases of the digestive system: Secondary | ICD-10-CM | POA: Diagnosis not present

## 2024-07-11 DIAGNOSIS — K863 Pseudocyst of pancreas: Secondary | ICD-10-CM | POA: Insufficient documentation

## 2024-07-11 DIAGNOSIS — K8020 Calculus of gallbladder without cholecystitis without obstruction: Secondary | ICD-10-CM | POA: Diagnosis not present

## 2024-07-11 DIAGNOSIS — K8021 Calculus of gallbladder without cholecystitis with obstruction: Secondary | ICD-10-CM | POA: Insufficient documentation

## 2024-07-11 DIAGNOSIS — K862 Cyst of pancreas: Secondary | ICD-10-CM | POA: Diagnosis not present

## 2024-07-11 MED ORDER — IOHEXOL 300 MG/ML  SOLN
100.0000 mL | Freq: Once | INTRAMUSCULAR | Status: AC | PRN
  Administered 2024-07-11: 100 mL via INTRAVENOUS

## 2024-07-19 ENCOUNTER — Ambulatory Visit: Payer: Self-pay | Admitting: Gastroenterology

## 2024-08-01 DIAGNOSIS — M79672 Pain in left foot: Secondary | ICD-10-CM | POA: Diagnosis not present

## 2024-08-01 DIAGNOSIS — S90822A Blister (nonthermal), left foot, initial encounter: Secondary | ICD-10-CM | POA: Diagnosis not present

## 2024-08-08 DIAGNOSIS — S90822A Blister (nonthermal), left foot, initial encounter: Secondary | ICD-10-CM | POA: Diagnosis not present

## 2024-08-10 ENCOUNTER — Telehealth: Payer: Self-pay | Admitting: Gastroenterology

## 2024-08-10 DIAGNOSIS — R1084 Generalized abdominal pain: Secondary | ICD-10-CM

## 2024-08-10 DIAGNOSIS — Z8719 Personal history of other diseases of the digestive system: Secondary | ICD-10-CM

## 2024-08-10 NOTE — Telephone Encounter (Signed)
 Spoke with the patient. He tells me he has been eating things like [place] food, pizza, and last night fried squash. Today he awoke around 7 am and had abdominal discomfort which worsened to around a 4 to 5. He took a TUMs and feels it may have helped some. He feels a little queasy and he has lots of gurgling of his stomach. He tells me it is reminiscent of when he had pancreatitis. He is presently fasting because he is afraid of making himself feel worse if he eats.

## 2024-08-10 NOTE — Telephone Encounter (Signed)
 In bound call from patient complaining of abdominal pain from like before he was treated for his pancreatitis. Patient is requesting a call back from the nurse. Please advise.

## 2024-08-11 ENCOUNTER — Other Ambulatory Visit (INDEPENDENT_AMBULATORY_CARE_PROVIDER_SITE_OTHER)

## 2024-08-11 ENCOUNTER — Ambulatory Visit: Payer: Self-pay | Admitting: Gastroenterology

## 2024-08-11 DIAGNOSIS — R1084 Generalized abdominal pain: Secondary | ICD-10-CM

## 2024-08-11 DIAGNOSIS — Z8719 Personal history of other diseases of the digestive system: Secondary | ICD-10-CM | POA: Diagnosis not present

## 2024-08-11 LAB — CBC WITH DIFFERENTIAL/PLATELET
Basophils Absolute: 0.1 K/uL (ref 0.0–0.1)
Basophils Relative: 1 % (ref 0.0–3.0)
Eosinophils Absolute: 0.3 K/uL (ref 0.0–0.7)
Eosinophils Relative: 4.2 % (ref 0.0–5.0)
HCT: 45.2 % (ref 39.0–52.0)
Hemoglobin: 15.2 g/dL (ref 13.0–17.0)
Lymphocytes Relative: 20.3 % (ref 12.0–46.0)
Lymphs Abs: 1.6 K/uL (ref 0.7–4.0)
MCHC: 33.5 g/dL (ref 30.0–36.0)
MCV: 82.2 fl (ref 78.0–100.0)
Monocytes Absolute: 0.6 K/uL (ref 0.1–1.0)
Monocytes Relative: 8.1 % (ref 3.0–12.0)
Neutro Abs: 5.3 K/uL (ref 1.4–7.7)
Neutrophils Relative %: 66.4 % (ref 43.0–77.0)
Platelets: 194 K/uL (ref 150.0–400.0)
RBC: 5.49 Mil/uL (ref 4.22–5.81)
RDW: 13.3 % (ref 11.5–15.5)
WBC: 8 K/uL (ref 4.0–10.5)

## 2024-08-11 LAB — COMPREHENSIVE METABOLIC PANEL WITH GFR
ALT: 22 U/L (ref 0–53)
AST: 20 U/L (ref 0–37)
Albumin: 4.9 g/dL (ref 3.5–5.2)
Alkaline Phosphatase: 76 U/L (ref 39–117)
BUN: 17 mg/dL (ref 6–23)
CO2: 30 meq/L (ref 19–32)
Calcium: 9.8 mg/dL (ref 8.4–10.5)
Chloride: 102 meq/L (ref 96–112)
Creatinine, Ser: 1.06 mg/dL (ref 0.40–1.50)
GFR: 68.63 mL/min (ref >60.00)
Glucose, Bld: 101 mg/dL — ABNORMAL HIGH (ref 70–99)
Potassium: 4.6 meq/L (ref 3.5–5.1)
Sodium: 140 meq/L (ref 135–145)
Total Bilirubin: 0.9 mg/dL (ref 0.2–1.2)
Total Protein: 7.7 g/dL (ref 6.0–8.3)

## 2024-08-11 LAB — LIPASE: Lipase: 73 U/L — ABNORMAL HIGH (ref 11.0–59.0)

## 2024-08-11 NOTE — Telephone Encounter (Signed)
 Spoke with the patient. He had a very limited diet yesterday after we talked. Feeling much better today. He will continue with broth and bland foods, and come in for labs.

## 2024-08-11 NOTE — Progress Notes (Signed)
 SABRA

## 2024-09-16 DIAGNOSIS — S20369A Insect bite (nonvenomous) of unspecified front wall of thorax, initial encounter: Secondary | ICD-10-CM | POA: Diagnosis not present

## 2024-10-05 ENCOUNTER — Encounter (INDEPENDENT_AMBULATORY_CARE_PROVIDER_SITE_OTHER): Payer: Self-pay | Admitting: Gastroenterology

## 2024-10-11 ENCOUNTER — Telehealth: Payer: Self-pay | Admitting: Gastroenterology

## 2024-10-11 DIAGNOSIS — K862 Cyst of pancreas: Secondary | ICD-10-CM

## 2024-10-11 DIAGNOSIS — K863 Pseudocyst of pancreas: Secondary | ICD-10-CM

## 2024-10-11 DIAGNOSIS — K8689 Other specified diseases of pancreas: Secondary | ICD-10-CM

## 2024-10-11 DIAGNOSIS — Z8719 Personal history of other diseases of the digestive system: Secondary | ICD-10-CM

## 2024-10-11 NOTE — Telephone Encounter (Signed)
 The pt is due for MRI MRCP- order entered and sent to the schedulers.  The pt aware and agrees

## 2024-10-11 NOTE — Telephone Encounter (Signed)
 Inbound call from patient requesting a call from Patty to discuss if a follow up visit is needed with Dr. Wilhelmenia. Please advise, thank you

## 2024-10-17 DIAGNOSIS — I10 Essential (primary) hypertension: Secondary | ICD-10-CM | POA: Diagnosis not present

## 2024-10-17 DIAGNOSIS — I2510 Atherosclerotic heart disease of native coronary artery without angina pectoris: Secondary | ICD-10-CM | POA: Diagnosis not present

## 2024-10-19 ENCOUNTER — Ambulatory Visit (HOSPITAL_COMMUNITY)
Admission: RE | Admit: 2024-10-19 | Discharge: 2024-10-19 | Disposition: A | Discharge status: Home / Self Care | Source: Ambulatory Visit | Attending: Gastroenterology | Admitting: Gastroenterology

## 2024-10-19 ENCOUNTER — Ambulatory Visit: Payer: Self-pay | Admitting: Gastroenterology

## 2024-10-19 ENCOUNTER — Other Ambulatory Visit: Payer: Self-pay | Admitting: Gastroenterology

## 2024-10-19 DIAGNOSIS — K863 Pseudocyst of pancreas: Secondary | ICD-10-CM

## 2024-10-19 DIAGNOSIS — Z8719 Personal history of other diseases of the digestive system: Secondary | ICD-10-CM

## 2024-10-19 DIAGNOSIS — K8020 Calculus of gallbladder without cholecystitis without obstruction: Secondary | ICD-10-CM | POA: Diagnosis not present

## 2024-10-19 DIAGNOSIS — K8689 Other specified diseases of pancreas: Secondary | ICD-10-CM

## 2024-10-19 DIAGNOSIS — K862 Cyst of pancreas: Secondary | ICD-10-CM

## 2024-10-19 DIAGNOSIS — R109 Unspecified abdominal pain: Secondary | ICD-10-CM | POA: Diagnosis not present

## 2024-10-19 MED ORDER — GADOBUTROL 1 MMOL/ML IV SOLN
8.0000 mL | Freq: Once | INTRAVENOUS | Status: AC | PRN
  Administered 2024-10-19: 8 mL via INTRAVENOUS

## 2024-10-19 NOTE — Telephone Encounter (Signed)
 Patient returning call.

## 2024-10-20 NOTE — Telephone Encounter (Signed)
 Patient requesting call back. Please advise, thank you

## 2024-10-21 NOTE — Telephone Encounter (Signed)
 Inbound call from patient requesting a call back from nurse Patty. Please advise.

## 2024-10-24 NOTE — Progress Notes (Signed)
 Referral has been made with records faxed.

## 2024-11-03 ENCOUNTER — Telehealth: Payer: Self-pay | Admitting: Gastroenterology

## 2024-11-03 NOTE — Telephone Encounter (Signed)
 Patient requesting to speak with a nurse ibn regards to [facility] referral . Please advise,.

## 2024-11-03 NOTE — Telephone Encounter (Signed)
 Spoke with pt and discussed [facility] referral. Confirmed referral was received and is currently being reviewed. Pt provided contact phone number for [facility] referral office. Pt verbalized understanding and had no additional questions at this time.   Will monitor for confirmation of appointment scheduling from [facility].  Pt advised to contact [facility] referral office if no update within 1-2 weeks.

## 2024-11-15 NOTE — Telephone Encounter (Signed)
 The pt has been advised that [facility] can take some time to get appts set up.  He has also called and was told the same.  He is concerned about his insurance and upcoming election of new plan.  He will choose a plan that covers [facility] so that he will be in network whenever the appt is made

## 2024-11-15 NOTE — Telephone Encounter (Signed)
 PT is calling to report that he had a pancreatic flare and he is wanting us  to expedite his referral. He still has not heard from [facility] yet. Please advise.

## 2024-11-24 ENCOUNTER — Other Ambulatory Visit: Payer: Self-pay | Admitting: Cardiology

## 2024-11-28 NOTE — Telephone Encounter (Signed)
 Spoke to patient who verbalized that he is currently taking medication (0.5 tablet) once daily

## 2024-11-28 NOTE — Telephone Encounter (Signed)
 Pharmacy called stating pt is out of meds

## 2024-12-06 ENCOUNTER — Telehealth: Payer: Self-pay | Admitting: Gastroenterology

## 2024-12-06 NOTE — Telephone Encounter (Signed)
 Spoke with the pt and made him aware that all imaging was sent on 11/3 and confirmation received.  I did resend to [phone]. The pt will call back if he has further concerns

## 2024-12-06 NOTE — Telephone Encounter (Signed)
 Inbound call from patient stating that he is needing to speak to [facility]. Patient stated that he was referred over to [facility] on November the 3,4 or 5 th. Patient spoke with [facility] and they can't not schedule him until they receive his CT and MRI's scan that he has had. Patient is requesting a call back. Please advise.

## 2025-01-02 ENCOUNTER — Telehealth: Payer: Self-pay | Admitting: Cardiology

## 2025-01-02 NOTE — Telephone Encounter (Signed)
" ° °  Name: [name]  DOB: December 27, 1950  MRN: [id number]  Primary Cardiologist: Jayson Sierras, MD   Preoperative team, please contact this patient and set up a phone call appointment for further preoperative risk assessment. Please obtain consent and complete medication review. Thank you for your help.  I confirm that guidance regarding antiplatelet and oral anticoagulation therapy has been completed and, if necessary, noted below.  Stress test 04/05/2024 Findings are consistent with no ischemia and no infarction. The study is low risk.   Per office protocol, if patient is without any new symptoms or concerns at the time of their virtual visit, he may hold aspirin  for 7 days prior to procedure. Please resume ASA as soon as possible postprocedure, at the discretion of the surgeon.    I also confirmed the patient resides in the state of [place] . As per [facility] Medical Board telemedicine laws, the patient must reside in the state in which the provider is licensed.   Lamarr Satterfield, NP 01/02/2025, 9:06 AM [facility]    "

## 2025-01-02 NOTE — Telephone Encounter (Signed)
 Patient is seeing Dr. Debera on 01/09/25, I will make a note for pre-op clearance to be done on that day as well.

## 2025-01-02 NOTE — Telephone Encounter (Signed)
" ° °  Pre-operative Risk Assessment    Patient Name: [name]  DOB: [birth year] MRN: [id number]      Request for Surgical Clearance    Procedure:  Robotic Cholecystectomy   Date of Surgery:  Clearance TBD                                 Surgeon:  Sabino Zani, MD Surgeon's Group or Practice Name:  [facility] Phone number:  [phone] Fax number:  [phone]   Type of Clearance Requested:   - Medical  - Pharmacy:  Hold please  advise which medications and for how long   Type of Anesthesia:  Not Indicated   Additional requests/questions:  Please fax a copy of clearance to the surgeon's office. Please supply recent EKG, Lab results,cardiac test, and last pacemake check if available.   Signed, Darryle GORMAN Glance   01/02/2025, 8:37 AM   "

## 2025-01-09 ENCOUNTER — Ambulatory Visit: Attending: Cardiology | Admitting: Cardiology

## 2025-01-09 ENCOUNTER — Other Ambulatory Visit: Payer: Self-pay

## 2025-01-09 ENCOUNTER — Encounter: Payer: Self-pay | Admitting: Cardiology

## 2025-01-09 VITALS — BP 120/76 | HR 60 | Ht 68.0 in | Wt 186.0 lb

## 2025-01-09 DIAGNOSIS — I10 Essential (primary) hypertension: Secondary | ICD-10-CM

## 2025-01-09 DIAGNOSIS — I25810 Atherosclerosis of coronary artery bypass graft(s) without angina pectoris: Secondary | ICD-10-CM | POA: Diagnosis not present

## 2025-01-09 DIAGNOSIS — E782 Mixed hyperlipidemia: Secondary | ICD-10-CM | POA: Diagnosis not present

## 2025-01-09 DIAGNOSIS — Z01810 Encounter for preprocedural cardiovascular examination: Secondary | ICD-10-CM

## 2025-01-09 DIAGNOSIS — I25119 Atherosclerotic heart disease of native coronary artery with unspecified angina pectoris: Secondary | ICD-10-CM | POA: Diagnosis not present

## 2025-01-09 DIAGNOSIS — M791 Myalgia, unspecified site: Secondary | ICD-10-CM

## 2025-01-09 DIAGNOSIS — T466X5D Adverse effect of antihyperlipidemic and antiarteriosclerotic drugs, subsequent encounter: Secondary | ICD-10-CM | POA: Diagnosis not present

## 2025-01-09 DIAGNOSIS — T466X5A Adverse effect of antihyperlipidemic and antiarteriosclerotic drugs, initial encounter: Secondary | ICD-10-CM

## 2025-01-09 NOTE — Patient Instructions (Signed)
Medication Instructions:  Your physician recommends that you continue on your current medications as directed. Please refer to the Current Medication list given to you today.   Labwork: None today  Testing/Procedures: None today  Follow-Up: 1 year Dr.McDowell  Any Other Special Instructions Will Be Listed Below (If Applicable).  If you need a refill on your cardiac medications before your next appointment, please call your pharmacy.

## 2025-01-09 NOTE — Progress Notes (Signed)
 "    Cardiology Office Note  Date: 01/09/2025   ID: [name], DOB 09-02-1951, MRN [id number]  History of Present Illness: [name] is a 74 y.o. male last seen in March 2025.  He is here for a follow-up visit.  Reports doing well overall, no angina or change in stamina.  I reviewed his records, he has been evaluated at [facility] with plan for robotic pancreatic cystgastrostomy and cholecystectomy presumably under general anesthesia.  We went over his medications.  He reports compliance with current regimen, no obvious intolerances.  Remains on Repatha , requesting interval lab work from PCP.  I reviewed his ECG today which shows sinus bradycardia, probable old anteroseptal infarct pattern.  He underwent both cardiac structural and ischemic testing last year as outlined below.  RCRI perioperative cardiac risk index is 2 points, 5% chance of major adverse cardiac event.  Physical Exam: VS:  BP 120/76 (BP Location: Left Arm, Patient Position: Sitting, Cuff Size: Normal)   Pulse 60   Ht 5' 8 (1.727 m)   Wt 186 lb (84.4 kg)   SpO2 98%   BMI 28.28 kg/m , BMI Body mass index is 28.28 kg/m.  Wt Readings from Last 3 Encounters:  01/09/25 186 lb (84.4 kg)  04/19/24 179 lb 6 oz (81.4 kg)  03/14/24 185 lb 9.6 oz (84.2 kg)    General: Patient appears comfortable at rest. HEENT: Conjunctiva and lids normal. Neck: Supple, no elevated JVP or carotid bruits. Lungs: Clear to auscultation, nonlabored breathing at rest. Cardiac: Regular rate and rhythm, no S3 or significant systolic murmur. Abdomen: Soft, bowel sounds present. Extremities: No pitting edema.  ECG:  An ECG dated 03/14/2024 was personally reviewed today and demonstrated:  Sinus bradycardia with borderline prolonged PR interval.  Labwork: 08/11/2024: ALT 22; AST 20; BUN 17; Creatinine, Ser 1.06; Hemoglobin 15.2; Platelets 194.0; Potassium 4.6; Sodium 140   Other Studies Reviewed Today:  Lexiscan  Myoview  04/05/2024:   Stress ECG  is negative for ischemia and arrhythmias. Occasional PVCs noted.   LV perfusion is abnormal. There is no evidence of ischemia. There is no evidence of infarction. Defect 1: There is a small fixed perfusion defect with moderate reduction in uptake present in the mid to basal inferoseptal location with normal wall motion in the defect area consistent with artifact. Defect 2: There is another small fixed perfusion defect with moderate reduction in uptake present in the basal inferior location with normal wall motion in the defect area consistent with artifact. Stress images are slightly more intensified compared to rest images due to the artifact caused by increased liver uptake of the radiotracer. No ischemia.   Left ventricular function is normal. Nuclear stress EF: 58%.   Findings are consistent with no ischemia and no infarction. The study is low risk.  Echocardiogram 04/05/2024:  1. Left ventricular ejection fraction, by estimation, is 60 to 65%. The  left ventricle has normal function. The left ventricle has no regional  wall motion abnormalities. There is severe asymmetric left ventricular  hypertrophy of the basal and septal  segments. Left ventricular diastolic parameters are consistent with Grade  I diastolic dysfunction (impaired relaxation).   2. Right ventricular systolic function is normal. The right ventricular  size is normal. There is normal pulmonary artery systolic pressure.   3. The mitral valve is normal in structure. Trivial mitral valve  regurgitation. No evidence of mitral stenosis.   4. The aortic valve is tricuspid. Aortic valve regurgitation is not  visualized. No aortic stenosis  is present.   5. Aortic dilatation noted. There is borderline dilatation of the aortic  root, measuring 39 mm. There is mild dilatation of the ascending aorta,  measuring 40 mm.   6. The inferior vena cava is normal in size with greater than 50%  respiratory variability, suggesting right atrial  pressure of 3 mmHg.   Assessment and Plan:  1.  Multivessel CAD status post CABG in 2014 ([place] ) with LIMA to LAD and SVG to first diagonal.  LVEF 60 to 65% by echocardiogram in April 2025.  Lexiscan  Myoview  at that time was low risk, no definite ischemia.  He is symptomatically stable on medical therapy, ECG reviewed.  Continue aspirin  81 mg daily and Repatha  140 mg every 14 days.   3.  Primary hypertension.  Blood pressure well-controlled today, continue bisoprolol  2.5 mg daily and Avapro  37.5 mg daily.   4.  Mixed hyperlipidemia with intolerance to statins and Zetia.  He continues on Repatha  140 mg every 14 days.  Last LDL 47 in August 2024.  Requesting interval lab work from PCP.  5.  Preoperative cardiac evaluation pending robotic pancreatic cystgastrostomy and cholecystectomy presumably under general anesthesia at [facility].  RCRI perioperative cardiac risk index is 2 points, 5% chance of major adverse cardiac event.  He is clinically stable and underwent cardiac structural and ischemic testing last year.  Should be able to proceed at low to intermediate risk.  May hold aspirin  7 days prior to operation.  Disposition:  Follow up 1 year.  Signed, Jayson JUDITHANN Sierras, M.D., F.A.C.C. [facility]

## 2025-01-09 NOTE — Addendum Note (Signed)
 Addended by: Analeise Mccleery A on: 01/09/2025 03:19 PM   Modules accepted: Orders

## 2025-01-11 ENCOUNTER — Ambulatory Visit: Payer: Self-pay | Admitting: Cardiology

## 2025-01-11 LAB — LIPID PANEL
Chol/HDL Ratio: 3.1 ratio (ref 0.0–5.0)
Cholesterol, Total: 100 mg/dL (ref 100–199)
HDL: 32 mg/dL — ABNORMAL LOW
LDL Chol Calc (NIH): 35 mg/dL (ref 0–99)
Triglycerides: 208 mg/dL — ABNORMAL HIGH (ref 0–149)
VLDL Cholesterol Cal: 33 mg/dL (ref 5–40)

## 2025-01-19 ENCOUNTER — Telehealth: Payer: Self-pay | Admitting: Cardiology

## 2025-01-19 NOTE — Telephone Encounter (Signed)
 Veronica from [facility] would like a fax of his recent EKG results, please advise.
